# Patient Record
Sex: Female | Born: 1935 | Race: White | Hispanic: No | Smoking: Never smoker
Health system: Southern US, Community
[De-identification: ages and names within clinical notes are randomized; demographics above are authoritative.]

## PROBLEM LIST (undated history)

## (undated) DIAGNOSIS — E785 Hyperlipidemia, unspecified: Secondary | ICD-10-CM

## (undated) DIAGNOSIS — G459 Transient cerebral ischemic attack, unspecified: Secondary | ICD-10-CM

## (undated) DIAGNOSIS — Z4889 Encounter for other specified surgical aftercare: Secondary | ICD-10-CM

## (undated) DIAGNOSIS — I447 Left bundle-branch block, unspecified: Secondary | ICD-10-CM

## (undated) DIAGNOSIS — D72829 Elevated white blood cell count, unspecified: Secondary | ICD-10-CM

## (undated) DIAGNOSIS — K859 Acute pancreatitis without necrosis or infection, unspecified: Secondary | ICD-10-CM

## (undated) DIAGNOSIS — I998 Other disorder of circulatory system: Secondary | ICD-10-CM

## (undated) DIAGNOSIS — E039 Hypothyroidism, unspecified: Secondary | ICD-10-CM

## (undated) DIAGNOSIS — N390 Urinary tract infection, site not specified: Secondary | ICD-10-CM

## (undated) DIAGNOSIS — N393 Stress incontinence (female) (male): Secondary | ICD-10-CM

## (undated) DIAGNOSIS — I2510 Atherosclerotic heart disease of native coronary artery without angina pectoris: Secondary | ICD-10-CM

## (undated) DIAGNOSIS — G4730 Sleep apnea, unspecified: Secondary | ICD-10-CM

## (undated) DIAGNOSIS — R1084 Generalized abdominal pain: Secondary | ICD-10-CM

## (undated) DIAGNOSIS — M5136 Other intervertebral disc degeneration, lumbar region: Secondary | ICD-10-CM

## (undated) DIAGNOSIS — L84 Corns and callosities: Secondary | ICD-10-CM

## (undated) DIAGNOSIS — K1321 Leukoplakia of oral mucosa, including tongue: Secondary | ICD-10-CM

## (undated) DIAGNOSIS — I4891 Unspecified atrial fibrillation: Secondary | ICD-10-CM

## (undated) DIAGNOSIS — E782 Mixed hyperlipidemia: Secondary | ICD-10-CM

## (undated) DIAGNOSIS — L988 Other specified disorders of the skin and subcutaneous tissue: Secondary | ICD-10-CM

## (undated) DIAGNOSIS — K589 Irritable bowel syndrome without diarrhea: Secondary | ICD-10-CM

## (undated) DIAGNOSIS — R55 Syncope and collapse: Secondary | ICD-10-CM

## (undated) DIAGNOSIS — Z8679 Personal history of other diseases of the circulatory system: Secondary | ICD-10-CM

## (undated) DIAGNOSIS — E079 Disorder of thyroid, unspecified: Secondary | ICD-10-CM

## (undated) DIAGNOSIS — R269 Unspecified abnormalities of gait and mobility: Secondary | ICD-10-CM

## (undated) DIAGNOSIS — C499 Malignant neoplasm of connective and soft tissue, unspecified: Secondary | ICD-10-CM

## (undated) DIAGNOSIS — Z79899 Other long term (current) drug therapy: Secondary | ICD-10-CM

## (undated) DIAGNOSIS — K219 Gastro-esophageal reflux disease without esophagitis: Secondary | ICD-10-CM

## (undated) DIAGNOSIS — K635 Polyp of colon: Secondary | ICD-10-CM

## (undated) DIAGNOSIS — H539 Unspecified visual disturbance: Secondary | ICD-10-CM

## (undated) DIAGNOSIS — I509 Heart failure, unspecified: Secondary | ICD-10-CM

## (undated) DIAGNOSIS — E538 Deficiency of other specified B group vitamins: Secondary | ICD-10-CM

## (undated) DIAGNOSIS — B351 Tinea unguium: Secondary | ICD-10-CM

## (undated) DIAGNOSIS — E7800 Pure hypercholesterolemia, unspecified: Secondary | ICD-10-CM

## (undated) DIAGNOSIS — R42 Dizziness and giddiness: Secondary | ICD-10-CM

## (undated) DIAGNOSIS — R609 Edema, unspecified: Secondary | ICD-10-CM

## (undated) DIAGNOSIS — J449 Chronic obstructive pulmonary disease, unspecified: Secondary | ICD-10-CM

## (undated) DIAGNOSIS — I10 Essential (primary) hypertension: Secondary | ICD-10-CM

## (undated) DIAGNOSIS — G603 Idiopathic progressive neuropathy: Secondary | ICD-10-CM

## (undated) DIAGNOSIS — M2011 Hallux valgus (acquired), right foot: Secondary | ICD-10-CM

## (undated) DIAGNOSIS — M2012 Hallux valgus (acquired), left foot: Secondary | ICD-10-CM

## (undated) DIAGNOSIS — E119 Type 2 diabetes mellitus without complications: Secondary | ICD-10-CM

## (undated) DIAGNOSIS — I6782 Cerebral ischemia: Secondary | ICD-10-CM

## (undated) DIAGNOSIS — D649 Anemia, unspecified: Secondary | ICD-10-CM

## (undated) DIAGNOSIS — F419 Anxiety disorder, unspecified: Secondary | ICD-10-CM

## (undated) DIAGNOSIS — G4733 Obstructive sleep apnea (adult) (pediatric): Secondary | ICD-10-CM

## (undated) DIAGNOSIS — M150 Primary generalized (osteo)arthritis: Secondary | ICD-10-CM

## (undated) HISTORY — DX: Anxiety disorder, unspecified: F41.9

## (undated) HISTORY — DX: Atherosclerotic heart disease of native coronary artery without angina pectoris: I25.10

## (undated) HISTORY — DX: Pure hypercholesterolemia, unspecified: E78.00

## (undated) HISTORY — DX: Deficiency of other specified B group vitamins: E53.8

## (undated) HISTORY — DX: Unspecified atrial fibrillation: I48.91

## (undated) HISTORY — DX: Stress incontinence (female) (male): N39.3

## (undated) HISTORY — DX: Elevated white blood cell count, unspecified: D72.829

## (undated) HISTORY — DX: Transient cerebral ischemic attack, unspecified: G45.9

## (undated) HISTORY — PX: ABDOMINAL HYSTERECTOMY: SHX81

## (undated) HISTORY — DX: Hypothyroidism, unspecified: E03.9

## (undated) HISTORY — DX: Other specified disorders of the skin and subcutaneous tissue: L98.8

## (undated) HISTORY — PX: OTHER SURGICAL HISTORY: SHX169

## (undated) HISTORY — DX: Other intervertebral disc degeneration, lumbar region: M51.36

## (undated) HISTORY — DX: Urinary tract infection, site not specified: N39.0

## (undated) HISTORY — DX: Other disorder of circulatory system: I99.8

## (undated) HISTORY — PX: PANCREAS SURGERY: SHX731

## (undated) HISTORY — DX: Disorder of thyroid, unspecified: E07.9

## (undated) HISTORY — DX: Dizziness and giddiness: R42

## (undated) HISTORY — DX: Idiopathic progressive neuropathy: G60.3

## (undated) HISTORY — DX: Generalized abdominal pain: R10.84

## (undated) HISTORY — DX: Polyp of colon: K63.5

## (undated) HISTORY — DX: Personal history of other diseases of the circulatory system: Z86.79

## (undated) HISTORY — DX: Sleep apnea, unspecified: G47.30

## (undated) HISTORY — DX: Gastro-esophageal reflux disease without esophagitis: K21.9

## (undated) HISTORY — DX: Type 2 diabetes mellitus without complications: E11.9

## (undated) HISTORY — PX: CHOLECYSTECTOMY: SHX55

## (undated) HISTORY — DX: Hyperlipidemia, unspecified: E78.5

## (undated) HISTORY — DX: Leukoplakia of oral mucosa, including tongue: K13.21

## (undated) HISTORY — DX: Encounter for other specified surgical aftercare: Z48.89

## (undated) HISTORY — DX: Syncope and collapse: R55

## (undated) HISTORY — DX: Cerebral ischemia: I67.82

## (undated) HISTORY — DX: Tinea unguium: B35.1

## (undated) HISTORY — DX: Acute pancreatitis without necrosis or infection, unspecified: K85.90

## (undated) HISTORY — DX: Heart failure, unspecified: I50.9

## (undated) HISTORY — DX: Unspecified visual disturbance: H53.9

## (undated) HISTORY — DX: Unspecified abnormalities of gait and mobility: R26.9

## (undated) HISTORY — DX: Corns and callosities: L84

## (undated) HISTORY — DX: Obstructive sleep apnea (adult) (pediatric): G47.33

## (undated) HISTORY — DX: Essential (primary) hypertension: I10

## (undated) HISTORY — DX: Malignant neoplasm of connective and soft tissue, unspecified: C49.9

## (undated) HISTORY — DX: Irritable bowel syndrome without diarrhea: K58.9

## (undated) HISTORY — DX: Primary generalized (osteo)arthritis: M15.0

## (undated) HISTORY — DX: Chronic obstructive pulmonary disease, unspecified: J44.9

## (undated) HISTORY — PX: EYE SURGERY: SHX253

## (undated) HISTORY — DX: Left bundle-branch block, unspecified: I44.7

## (undated) HISTORY — DX: Edema, unspecified: R60.9

## (undated) HISTORY — DX: Mixed hyperlipidemia: E78.2

## (undated) HISTORY — DX: Other long term (current) drug therapy: Z79.899

## (undated) HISTORY — DX: Hallux valgus (acquired), right foot: M20.11

## (undated) HISTORY — DX: Anemia, unspecified: D64.9

## (undated) HISTORY — DX: Hallux valgus (acquired), left foot: M20.12

---

## 2009-06-07 DIAGNOSIS — R269 Unspecified abnormalities of gait and mobility: Secondary | ICD-10-CM

## 2009-06-07 DIAGNOSIS — G603 Idiopathic progressive neuropathy: Secondary | ICD-10-CM | POA: Insufficient documentation

## 2009-06-07 HISTORY — DX: Idiopathic progressive neuropathy: G60.3

## 2009-06-07 HISTORY — DX: Unspecified abnormalities of gait and mobility: R26.9

## 2012-03-09 DIAGNOSIS — Z859 Personal history of malignant neoplasm, unspecified: Secondary | ICD-10-CM

## 2012-03-09 DIAGNOSIS — E119 Type 2 diabetes mellitus without complications: Secondary | ICD-10-CM

## 2012-03-09 DIAGNOSIS — E039 Hypothyroidism, unspecified: Secondary | ICD-10-CM

## 2012-03-09 DIAGNOSIS — I110 Hypertensive heart disease with heart failure: Secondary | ICD-10-CM

## 2012-03-09 DIAGNOSIS — C499 Malignant neoplasm of connective and soft tissue, unspecified: Secondary | ICD-10-CM

## 2012-03-09 DIAGNOSIS — J449 Chronic obstructive pulmonary disease, unspecified: Secondary | ICD-10-CM

## 2012-03-09 DIAGNOSIS — I10 Essential (primary) hypertension: Secondary | ICD-10-CM

## 2012-03-09 HISTORY — DX: Malignant neoplasm of connective and soft tissue, unspecified: C49.9

## 2012-03-09 HISTORY — DX: Hypothyroidism, unspecified: E03.9

## 2012-03-09 HISTORY — DX: Personal history of malignant neoplasm, unspecified: Z85.9

## 2012-03-09 HISTORY — DX: Type 2 diabetes mellitus without complications: E11.9

## 2012-03-09 HISTORY — DX: Hypertensive heart disease with heart failure: I11.0

## 2012-03-09 HISTORY — DX: Essential (primary) hypertension: I10

## 2012-03-09 HISTORY — DX: Chronic obstructive pulmonary disease, unspecified: J44.9

## 2014-02-20 DIAGNOSIS — D509 Iron deficiency anemia, unspecified: Secondary | ICD-10-CM

## 2014-02-20 DIAGNOSIS — K219 Gastro-esophageal reflux disease without esophagitis: Secondary | ICD-10-CM | POA: Insufficient documentation

## 2014-02-20 DIAGNOSIS — F419 Anxiety disorder, unspecified: Secondary | ICD-10-CM

## 2014-02-20 DIAGNOSIS — L988 Other specified disorders of the skin and subcutaneous tissue: Secondary | ICD-10-CM

## 2014-02-20 DIAGNOSIS — E119 Type 2 diabetes mellitus without complications: Secondary | ICD-10-CM

## 2014-02-20 DIAGNOSIS — I10 Essential (primary) hypertension: Secondary | ICD-10-CM

## 2014-02-20 HISTORY — DX: Gastro-esophageal reflux disease without esophagitis: K21.9

## 2014-02-20 HISTORY — DX: Essential (primary) hypertension: I10

## 2014-02-20 HISTORY — DX: Type 2 diabetes mellitus without complications: E11.9

## 2014-02-20 HISTORY — DX: Other specified disorders of the skin and subcutaneous tissue: L98.8

## 2014-02-20 HISTORY — DX: Anxiety disorder, unspecified: F41.9

## 2014-02-20 HISTORY — DX: Iron deficiency anemia, unspecified: D50.9

## 2014-08-03 ENCOUNTER — Encounter: Payer: Self-pay | Admitting: Gastroenterology

## 2015-06-24 DIAGNOSIS — I5032 Chronic diastolic (congestive) heart failure: Secondary | ICD-10-CM

## 2015-06-24 DIAGNOSIS — G4733 Obstructive sleep apnea (adult) (pediatric): Secondary | ICD-10-CM

## 2015-06-24 DIAGNOSIS — I447 Left bundle-branch block, unspecified: Secondary | ICD-10-CM

## 2015-06-24 DIAGNOSIS — E782 Mixed hyperlipidemia: Secondary | ICD-10-CM

## 2015-06-24 DIAGNOSIS — I509 Heart failure, unspecified: Secondary | ICD-10-CM

## 2015-06-24 DIAGNOSIS — D72829 Elevated white blood cell count, unspecified: Secondary | ICD-10-CM | POA: Insufficient documentation

## 2015-06-24 DIAGNOSIS — K1321 Leukoplakia of oral mucosa, including tongue: Secondary | ICD-10-CM

## 2015-06-24 DIAGNOSIS — Z79899 Other long term (current) drug therapy: Secondary | ICD-10-CM | POA: Insufficient documentation

## 2015-06-24 DIAGNOSIS — K589 Irritable bowel syndrome without diarrhea: Secondary | ICD-10-CM | POA: Insufficient documentation

## 2015-06-24 DIAGNOSIS — R609 Edema, unspecified: Secondary | ICD-10-CM

## 2015-06-24 HISTORY — DX: Other long term (current) drug therapy: Z79.899

## 2015-06-24 HISTORY — DX: Left bundle-branch block, unspecified: I44.7

## 2015-06-24 HISTORY — DX: Irritable bowel syndrome, unspecified: K58.9

## 2015-06-24 HISTORY — DX: Heart failure, unspecified: I50.9

## 2015-06-24 HISTORY — DX: Edema, unspecified: R60.9

## 2015-06-24 HISTORY — DX: Obstructive sleep apnea (adult) (pediatric): G47.33

## 2015-06-24 HISTORY — DX: Elevated white blood cell count, unspecified: D72.829

## 2015-06-24 HISTORY — DX: Chronic diastolic (congestive) heart failure: I50.32

## 2015-06-24 HISTORY — DX: Leukoplakia of oral mucosa, including tongue: K13.21

## 2015-06-24 HISTORY — DX: Mixed hyperlipidemia: E78.2

## 2015-06-27 DIAGNOSIS — N390 Urinary tract infection, site not specified: Secondary | ICD-10-CM

## 2015-06-27 HISTORY — DX: Urinary tract infection, site not specified: N39.0

## 2015-07-06 DIAGNOSIS — M159 Polyosteoarthritis, unspecified: Secondary | ICD-10-CM

## 2015-07-06 DIAGNOSIS — M8949 Other hypertrophic osteoarthropathy, multiple sites: Secondary | ICD-10-CM

## 2015-07-06 DIAGNOSIS — M150 Primary generalized (osteo)arthritis: Secondary | ICD-10-CM

## 2015-07-06 HISTORY — DX: Other hypertrophic osteoarthropathy, multiple sites: M89.49

## 2015-07-06 HISTORY — DX: Primary generalized (osteo)arthritis: M15.0

## 2015-07-06 HISTORY — DX: Polyosteoarthritis, unspecified: M15.9

## 2015-07-29 DIAGNOSIS — I2510 Atherosclerotic heart disease of native coronary artery without angina pectoris: Secondary | ICD-10-CM

## 2015-07-29 DIAGNOSIS — E785 Hyperlipidemia, unspecified: Secondary | ICD-10-CM | POA: Insufficient documentation

## 2015-07-29 HISTORY — DX: Atherosclerotic heart disease of native coronary artery without angina pectoris: I25.10

## 2015-07-29 HISTORY — DX: Hyperlipidemia, unspecified: E78.5

## 2015-11-06 DIAGNOSIS — M5136 Other intervertebral disc degeneration, lumbar region: Secondary | ICD-10-CM

## 2015-11-06 DIAGNOSIS — M51369 Other intervertebral disc degeneration, lumbar region without mention of lumbar back pain or lower extremity pain: Secondary | ICD-10-CM

## 2015-11-06 HISTORY — DX: Other intervertebral disc degeneration, lumbar region without mention of lumbar back pain or lower extremity pain: M51.369

## 2015-11-06 HISTORY — DX: Other intervertebral disc degeneration, lumbar region: M51.36

## 2015-11-27 DIAGNOSIS — L84 Corns and callosities: Secondary | ICD-10-CM

## 2015-11-27 DIAGNOSIS — B351 Tinea unguium: Secondary | ICD-10-CM | POA: Insufficient documentation

## 2015-11-27 DIAGNOSIS — Z4889 Encounter for other specified surgical aftercare: Secondary | ICD-10-CM

## 2015-11-27 HISTORY — DX: Tinea unguium: B35.1

## 2015-11-27 HISTORY — DX: Corns and callosities: L84

## 2015-11-27 HISTORY — DX: Encounter for other specified surgical aftercare: Z48.89

## 2015-12-04 DIAGNOSIS — M2011 Hallux valgus (acquired), right foot: Secondary | ICD-10-CM

## 2015-12-04 DIAGNOSIS — L84 Corns and callosities: Secondary | ICD-10-CM

## 2015-12-04 DIAGNOSIS — M2012 Hallux valgus (acquired), left foot: Secondary | ICD-10-CM

## 2015-12-04 DIAGNOSIS — E538 Deficiency of other specified B group vitamins: Secondary | ICD-10-CM

## 2015-12-04 HISTORY — DX: Corns and callosities: L84

## 2015-12-04 HISTORY — DX: Hallux valgus (acquired), left foot: M20.12

## 2015-12-04 HISTORY — DX: Hallux valgus (acquired), right foot: M20.11

## 2015-12-04 HISTORY — DX: Deficiency of other specified B group vitamins: E53.8

## 2016-01-29 DIAGNOSIS — I998 Other disorder of circulatory system: Secondary | ICD-10-CM | POA: Insufficient documentation

## 2016-01-29 DIAGNOSIS — H539 Unspecified visual disturbance: Secondary | ICD-10-CM

## 2016-01-29 DIAGNOSIS — Z8679 Personal history of other diseases of the circulatory system: Secondary | ICD-10-CM

## 2016-01-29 HISTORY — DX: Other disorder of circulatory system: I99.8

## 2016-01-29 HISTORY — DX: Unspecified visual disturbance: H53.9

## 2016-01-29 HISTORY — DX: Personal history of other diseases of the circulatory system: Z86.79

## 2016-02-07 DIAGNOSIS — R42 Dizziness and giddiness: Secondary | ICD-10-CM

## 2016-02-07 HISTORY — DX: Dizziness and giddiness: R42

## 2016-03-11 DIAGNOSIS — I6782 Cerebral ischemia: Secondary | ICD-10-CM

## 2016-03-11 HISTORY — DX: Cerebral ischemia: I67.82

## 2016-03-18 ENCOUNTER — Ambulatory Visit: Payer: Self-pay | Admitting: Allergy

## 2016-04-17 DIAGNOSIS — N393 Stress incontinence (female) (male): Secondary | ICD-10-CM

## 2016-04-17 HISTORY — DX: Stress incontinence (female) (male): N39.3

## 2016-10-13 DIAGNOSIS — G459 Transient cerebral ischemic attack, unspecified: Secondary | ICD-10-CM | POA: Insufficient documentation

## 2016-10-13 DIAGNOSIS — R55 Syncope and collapse: Secondary | ICD-10-CM | POA: Insufficient documentation

## 2016-10-13 HISTORY — DX: Transient cerebral ischemic attack, unspecified: G45.9

## 2016-10-13 HISTORY — DX: Syncope and collapse: R55

## 2016-10-18 DIAGNOSIS — D649 Anemia, unspecified: Secondary | ICD-10-CM

## 2016-10-18 HISTORY — DX: Anemia, unspecified: D64.9

## 2016-10-24 DIAGNOSIS — R1084 Generalized abdominal pain: Secondary | ICD-10-CM

## 2016-10-24 HISTORY — DX: Generalized abdominal pain: R10.84

## 2016-11-28 ENCOUNTER — Ambulatory Visit: Payer: Self-pay | Admitting: Cardiology

## 2016-12-09 NOTE — Progress Notes (Signed)
Cardiology Office Note:    Date:  12/10/2016   ID:  Wendy Leon, DOB 31-Jul-1932, MRN [id number]  PCP:  Raina Mina., MD  Cardiologist:  Shirlee More, MD    Referring MD: No ref. provider found    ASSESSMENT:    1. Chronic diastolic heart failure ([place])   2. Essential hypertension   3. Left bundle branch block   4. Mild CAD    PLAN:    In order of problems listed above:  1. Stable compensated continue current diuretics sodium restriction 2. Stable blood pressure target today she has no significant orthostatic drop repeat blood pressure by me 142/70 standing 130/60 and I would continue her current treatment including calcium channel blocker and diuretic and beta blocker 3. Stable pattern reducing the EKG 4. Stable continue medical treatment including antibody treatment for hyperlipidemia being intolerant of oral lipid-lowering medications. We will check a lipid profile today   Next appointment: Stable   Medication Adjustments/Labs and Tests Ordered: Current medicines are reviewed at length with the patient today.  Concerns regarding medicines are outlined above.  No orders of the defined types were placed in this encounter.  No orders of the defined types were placed in this encounter.   Chief Complaint  Patient presents with  . Follow-up    Routine flup appt, needs clearance to have dental procedure  . Irregular Heart Beat    History of Present Illness:    Wendy Leon is a 83 y.o. female with a hx of pancreatic cancer,mild CAD 2016 with 60% LAD with normal FFR , Dyslipidemia with statin intolerance, HTN and costochondral chest pain last seen in April 2018. She had a recent [facility] admission June when she had a syncopal episode and has had previous severe symptomatic orthostatic hypotension. Since then she is done well no dizziness syncope chest pain palpitation. She has dental abscess and is awaiting tooth extraction and was requested to discuss with  me Compliance with diet, lifestyle and medications: Yes Past Medical History:  Diagnosis Date  . Abnormal gait 06/07/2009   Last Assessment & Plan:  She saw specialist who has given her orders for PT and she is wanting to have it setup for her at [facility] where she already is receiving care from at home  . Acid reflux 02/20/2014  . Aftercare following surgery 11/27/2015  . Anemia of unknown etiology 10/18/2016  . Anxiety 02/20/2014  . Cerebral ischemia 03/11/2016   Overview:  Microvascular ischemia seen on MRI 01/2015 with partial empty sella turcica neurology appt 03/12/2016 pending with [facility], also [facility] with PT coming to the house for her gait abnormality and lumbar DDD, as well as local ortho (d[facility])   . CHF (congestive heart failure) ([place]) 06/24/2015   Last Assessment & Plan:  Relevant Hx: Course: Daily Update: Today's Plan:she has been doing well overall from her heart and no SOB  Electronically signed by: Mayer Camel, NP 06/26/15 1522  . Chronic UTI 06/27/2015   Last Assessment & Plan:  She requested a UA be checked for her as she was having more frequency than before and it has been awhile since she was on oral abx for this, she is on low dose maintenance med to prevent and has done well with that.  Marland Kitchen COPD (chronic obstructive pulmonary disease) ([place]) 03/09/2012   Last Assessment & Plan:  She feels she is stable from this discussed with her the allergies she has and she is  going to discuss with Dr. Alcide Clever having allergy injections as well for this   . Diabetes ([place]) 02/20/2014  . Dizziness 02/07/2016   Overview:  Chronic and has been having falls, MRI showing chronic microvascular ischemia, as well as partial sella turcica pending neuro appt for her with [facility]  . Dyslipidemia 07/29/2015  . Edema 06/24/2015   Last Assessment & Plan:  Relevant Hx: Course: Daily Update: Today's Plan:this is stable for her overall and will follow for her  Electronically signed by:  Mayer Camel, NP 06/27/15 0813  . Essential hypertension 03/09/2012   Last Assessment & Plan:  On repeat this was stable for her and she does not check it at home much but when has been checked has been fine for her  . Fistula 02/20/2014  . Gastroesophageal reflux disease without esophagitis 02/20/2014   Last Assessment & Plan:  Relevant Hx: Course: Daily Update: Today's Plan:not on meds for this as she has had issues with her arms cramping and she is going to just drink buttermilk  Electronically signed by: Mayer Camel, NP 06/26/15 1530  . Generalized abdominal pain 10/24/2016  . Hallux valgus of left foot 12/04/2015  . Hallux valgus of right foot 12/04/2015  . High risk medication use 06/24/2015  . Hx of valvular heart disease 01/29/2016  . Hypertension 02/20/2014  . Hypothyroidism (acquired) 03/09/2012   Last Assessment & Plan:  She was due for this to be updated and have advised her to get this drawn while here today  . IBS (irritable bowel syndrome) 06/24/2015   Last Assessment & Plan:  Relevant Hx: Course: Daily Update: Today's Plan:this is stable for her  Electronically signed by: Mayer Camel, NP 06/26/15 1531  . Idiopathic progressive polyneuropathy 06/07/2009  . Left bundle branch block 06/24/2015  . Leukocytosis 06/24/2015   Last Assessment & Plan:  Relevant Hx: Course: Daily Update: Today's Plan:this has been stable for her with FU through hematology who felt she was having benign swings in this and she chose to not have bone marrow biopsy done   Electronically signed by: Mayer Camel, NP 06/27/15 [phone]  . Liposarcoma, well differentiated type ([place]) 03/09/2012   Overview:  Initial excision 03/18/2000 (Dr. Ralene Cork [facility]), 8 x 6 x 3 cm. Local recurrence, re-excision 03/23/2007 Candiss Norse), 5 x 3 x 2 cm. Surveillance plan: baseline MRI April 2014  Last Assessment & Plan:  Relevant Hx: Course: Daily Update: Today's Plan:she is  stable from this  Electronically signed by: Mayer Camel, NP 06/26/15 1533  . Lumbar degenerative disc disease 11/06/2015   Last Assessment & Plan:  At this point she needs updated MRI of her lumbar spine and we discussed with her gait abnormality she is likely going to have to see specialist at [facility] for this, she has seen local neuro for this several years ago in terms of her neuropathy and her falls and he really did not address it . She was advised several years ago to have back surgery locally which we did not feel was ideal with her sugars at the time being so out of control and now she is really more in control than then and it may be doable for her if felt  Needed though she would have to have cardiology clear her as well.  . Mild CAD 07/29/2015   Overview:  Overview:  Cath march 2016 with 60% LAD with normal FFR   Overview:  Cath march 2016 with 60% LAD  with normal FFR   . Mixed hyperlipidemia 06/24/2015   Last Assessment & Plan:  Relevant Hx: Course: Daily Update: Today's Plan:update her lipids for her today and she is trying to watch her diet   Electronically signed by: Mayer Camel, NP 06/27/15 0813  . Obstructive sleep apnea syndrome 06/24/2015  . Onychomycosis due to dermatophyte 11/27/2015  . 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   Last Assessment & Plan:  Relevant Hx: Course: Daily Update: Today's Plan:she is doing well overall  Electronically signed by: Mayer Camel, NP 06/26/15 1531  . Pre-ulcerative calluses 12/04/2015  . Pre-ulcerative corn or callous 11/27/2015  . Primary osteoarthritis involving multiple joints 07/06/2015   Last Assessment & Plan:  Relevant Hx: Course: Daily Update: Today's Plan:she has DDD of her back with some spinal  Stenosis that she has seen ortho locally for and he wanted to do surgery which she has avoided and I agree with, she is going to use the flexeril for this right now as she is having more muscle spasm than what she has had in  the past from her attempt to walk down some very steep steps that she was not used to . She should use her walker for support through the weekend and avoid overhead strain and lifting and stay in touch if not better.  Electronically signed by: Mayer Camel, NP 07/06/15 [phone]  . Stress incontinence of urine 04/17/2016  . Syncope and collapse 10/13/2016  . TIA (transient ischemic attack) 10/13/2016  . Type 2 diabetes mellitus without complication, without long-term current use of insulin ([place]) 03/09/2012   Last Assessment & Plan:  She brings in readings of her sugars which show she has been doing much better overall with this at home and quite pleased with her  . Vascular calcification 01/29/2016  . Visual changes 01/29/2016   Overview:  Following with Dr. Julio Alm as well as had recent carotid doppler showing minimal plaque present and vertebral arteries normal , nothing signficant  . Vitamin B12 deficiency 12/04/2015    Past Surgical History:  Procedure Laterality Date  . ABDOMINAL HYSTERECTOMY    . CHOLECYSTECTOMY    . EYE SURGERY    . PANCREAS SURGERY    . skin cancer lesion      Current Medications: Current Meds  Medication Sig  . Alirocumab 150 MG/ML SOPN Inject 150 mg into the skin every 14 (fourteen) days.  . ALPRAZolam (XANAX) 0.25 MG tablet Take 0.25 mg by mouth 3 (three) times daily as needed for anxiety.  Marland Kitchen amLODipine (NORVASC) 2.5 MG tablet Take 2.5 mg by mouth daily.  Marland Kitchen aspirin EC 81 MG tablet Take 81 mg by mouth daily.  . benazepril (LOTENSIN) 10 MG tablet Take 10 mg by mouth daily.  . betamethasone dipropionate (DIPROLENE) 0.05 % cream APPLY TO AFFECTED AREAS TOPICALLY TWICE DAILY AS NEEDED  . bethanechol (URECHOLINE) 10 MG tablet Take 10 mg by mouth 2 (two) times daily.  . Blood Glucose Monitoring Suppl (ACCU-CHEK AVIVA PLUS) w/Device KIT See admin instructions.  . donepezil (ARICEPT) 5 MG tablet Take 5 mg by mouth at bedtime.  Marland Kitchen doxazosin (CARDURA) 1 MG tablet  Take 1 mg by mouth daily.  . fluticasone (FLONASE) 50 MCG/ACT nasal spray Place 1 spray into the nose 2 (two) times daily.  . furosemide (LASIX) 40 MG tablet TAKE 1 TABLET IN THE MORNING  108Q 5 TAKE 1 2 TABLET AT BEDTIME  . gabapentin (NEURONTIN) 300 MG capsule Take 300 mg by mouth daily.  Marland Kitchen  glimepiride (AMARYL) 1 MG tablet Take 0.5 tablets by mouth daily.  Marland Kitchen levothyroxine (SYNTHROID, LEVOTHROID) 75 MCG tablet Take 75 mcg by mouth daily.  Marland Kitchen loratadine (CLARITIN) 10 MG tablet Take 10 mg by mouth at bedtime.  . metFORMIN (GLUCOPHAGE-XR) 500 MG 24 hr tablet Take 500 mg by mouth 2 (two) times daily.  . metoprolol tartrate (LOPRESSOR) 50 MG tablet Take 50 mg by mouth 2 (two) times daily.  . montelukast (SINGULAIR) 10 MG tablet Take 10 mg by mouth at bedtime.  . nitroGLYCERIN (NITROSTAT) 0.4 MG SL tablet Place 0.4 mg under the tongue every 5 (five) minutes x 3 doses as needed for chest pain.  . polyethylene glycol powder (GLYCOLAX/MIRALAX) powder TAKE 17 GRAMS BY MOUTH TWICE A DAY  . ranitidine (ZANTAC) 150 MG tablet Take 150 mg by mouth daily.  . silver sulfADIAZINE (SILVADENE) 1 % cream APPLY TO AFFECTED AREAS TOPICALLY TWICE DAILY AS NEEDED  . SYMBICORT 160-4.5 MCG/ACT inhaler Inhale 2 puffs into the lungs 2 (two) times daily.     Allergies:   Nitrofurantoin and Statins   Social History   Social History  . Marital status: Married    Spouse name: N/A  . Number of children: N/A  . Years of education: N/A   Social History Main Topics  . Smoking status: Never Smoker  . Smokeless tobacco: Never Used  . Alcohol use No  . Drug use: No  . Sexual activity: Not Asked   Other Topics Concern  . None   Social History Narrative  . None     Family History: The patient's family history includes Cancer in her sister; Diabetes in her mother. ROS:   Please see the history of present illness.    All other systems reviewed and are negative.  EKGs/Labs/Other Studies Reviewed:    EKg 10/13/16:   Result Narrative  Normal sinus rhythm Left ventricular hypertrophy with QRS widening and repolarization abnormality Cannot rule out Septal infarct , age undetermined Abnormal ECG When compared with ECG of 08-Dec-2003 15:48, Significant changes have occurred Confirmed by Marta Antu [phone]) on 10/12/2016 11:32:48 PM   10/13/16 troponin undetectable, CBC and CMP normal The following studies were reviewed today: Echo in June 2018, Conclusions Summary Normal left ventricular systolic function There is mild aortic sclerosis noted, with no evidence of stenosis. Normal right ventricular size and function.  Recent Labs: 10/17/16 A1c 7.5% CMP normal No results found for requested labs within last 8760 hours.  Recent Lipid Panel 04/17/16: Chol230, HDL 70 LDL 140 No results found for: CHOL, TRIG, HDL, CHOLHDL, VLDL, LDLCALC, LDLDIRECT  Physical Exam:    VS:  BP (!) 162/76 (BP Location: Right Arm, Patient Position: Sitting)   Pulse 64   Ht '5\' 4"'$  (1.626 m)   Wt 132 lb (59.9 kg)   SpO2 97%   BMI 22.66 kg/m     Wt Readings from Last 3 Encounters:  12/10/16 132 lb (59.9 kg)     GEN:  Well nourished, well developed in no acute distress HEENT: Normal NECK: No JVD; No carotid bruits LYMPHATICS: No lymphadenopathy CARDIAC: RRR, no murmurs, rubs, gallops RESPIRATORY:  Clear to auscultation without rales, wheezing or rhonchi  ABDOMEN: Soft, non-tender, non-distended MUSCULOSKELETAL:  No edema; No deformity  SKIN: Warm and dry NEUROLOGIC:  Alert and oriented x 3 PSYCHIATRIC:  Normal affect    Signed, Shirlee More, MD  12/10/2016 11:34 AM    [place]

## 2016-12-10 ENCOUNTER — Ambulatory Visit (INDEPENDENT_AMBULATORY_CARE_PROVIDER_SITE_OTHER): Payer: Medicare Other | Admitting: Cardiology

## 2016-12-10 ENCOUNTER — Other Ambulatory Visit: Payer: Self-pay | Admitting: Cardiology

## 2016-12-10 ENCOUNTER — Encounter: Payer: Self-pay | Admitting: Cardiology

## 2016-12-10 VITALS — BP 146/82 | HR 64 | Ht 64.0 in | Wt 132.0 lb

## 2016-12-10 DIAGNOSIS — I447 Left bundle-branch block, unspecified: Secondary | ICD-10-CM | POA: Diagnosis not present

## 2016-12-10 DIAGNOSIS — I2510 Atherosclerotic heart disease of native coronary artery without angina pectoris: Secondary | ICD-10-CM

## 2016-12-10 DIAGNOSIS — E785 Hyperlipidemia, unspecified: Secondary | ICD-10-CM

## 2016-12-10 DIAGNOSIS — I5032 Chronic diastolic (congestive) heart failure: Secondary | ICD-10-CM | POA: Diagnosis not present

## 2016-12-10 DIAGNOSIS — I10 Essential (primary) hypertension: Secondary | ICD-10-CM | POA: Diagnosis not present

## 2016-12-10 NOTE — Patient Instructions (Signed)
Medication Instructions:  Your physician recommends that you continue on your current medications as directed. Please refer to the Current Medication list given to you today.   Labwork: Your physician recommends that you return for lab work in: today. Lipid   Testing/Procedures: None  Follow-Up: Your physician wants you to follow-up in: 6 months. You will receive a reminder letter in the mail two months in advance. If you don't receive a letter, please call our office to schedule the follow-up appointment.   Any Other Special Instructions Will Be Listed Below (If Applicable).     If you need a refill on your cardiac medications before your next appointment, please call your pharmacy.

## 2016-12-11 LAB — LIPID PANEL
Chol/HDL Ratio: 2.2 ratio (ref 0.0–4.4)
Cholesterol, Total: 150 mg/dL (ref 100–199)
HDL: 69 mg/dL (ref >39)
LDL Calculated: 63 mg/dL (ref 0–99)
Triglycerides: 90 mg/dL (ref 0–149)
VLDL Cholesterol Cal: 18 mg/dL (ref 5–40)

## 2017-01-27 ENCOUNTER — Other Ambulatory Visit: Payer: Self-pay

## 2017-01-27 MED ORDER — BENAZEPRIL HCL 10 MG PO TABS: 10.0000 mg | ORAL_TABLET | Freq: Every day | ORAL | 3 refills | Status: DC

## 2017-01-30 ENCOUNTER — Other Ambulatory Visit: Payer: Self-pay

## 2017-02-03 ENCOUNTER — Other Ambulatory Visit: Payer: Self-pay

## 2017-02-03 MED ORDER — FUROSEMIDE 40 MG PO TABS: 40.0000 mg | ORAL_TABLET | Freq: Two times a day (BID) | ORAL | 3 refills | Status: DC

## 2017-02-03 MED ORDER — NITROGLYCERIN 0.4 MG SL SUBL: 0.4000 mg | SUBLINGUAL_TABLET | SUBLINGUAL | 11 refills | Status: DC | PRN

## 2017-05-18 DIAGNOSIS — I361 Nonrheumatic tricuspid (valve) insufficiency: Secondary | ICD-10-CM | POA: Diagnosis not present

## 2017-05-18 DIAGNOSIS — I10 Essential (primary) hypertension: Secondary | ICD-10-CM | POA: Diagnosis not present

## 2017-05-28 ENCOUNTER — Telehealth: Payer: Self-pay | Admitting: Pharmacist

## 2017-05-28 MED ORDER — ALIROCUMAB 150 MG/ML ~~LOC~~ SOPN: 150.0000 mg | PEN_INJECTOR | SUBCUTANEOUS | 11 refills | Status: DC

## 2017-05-28 NOTE — Telephone Encounter (Signed)
Praluent prior authorization has been approved through 04/27/18.

## 2017-07-06 NOTE — Progress Notes (Signed)
Cardiology Office Note:    Date:  07/07/2017   ID:  Wendy Leon, DOB 09/04/1932, MRN [id number]  PCP:  Raina Mina., MD  Cardiologist:  Shirlee More, MD    Referring MD: Raina Mina., MD    ASSESSMENT:    1. Chronic diastolic heart failure ([place])   2. Hypertensive heart disease with heart failure ([place])   3. Left bundle branch block   4. Mild CAD    PLAN:    In order of problems listed above:  1. Stable compensated continue current diuretic sodium restriction home self management. 2. Stable her standing blood pressure today is 138/60 she has had severe symptomatic orthostatic hypotension in the past due to neuropathy at this time I would not try to control her diabetes to a tighter target.  Continue current treatment including low-dose beta-blocker 3. Stable pattern on EKG performed in June 2018 reviewed independently through care everywhere from a previous practice 4. Stable continue current treatment including aspirin calcium channel blocker beta-blocker and lipid-lowering therapy with Repatha as she is absolutely statin intolerant.  Her lipids are ideal 5. Stable continue Repatha as she is statin intolerant and high risk with CAD.  She has no evidence of liver toxicity and her LDL is at target   Next appointment: 6 months   Medication Adjustments/Labs and Tests Ordered: Current medicines are reviewed at length with the patient today.  Concerns regarding medicines are outlined above.  No orders of the defined types were placed in this encounter.  No orders of the defined types were placed in this encounter.   Chief Complaint  Patient presents with  . Follow-up    6 month flup appt   . Congestive Heart Failure  . Hypertension  . Coronary Artery Disease    History of Present Illness:    Wendy Leon is a 84 y.o. female with a hx of pancreatic cancer,mild CAD 2016 with 60% LAD with normal FFR , Dyslipidemia with statin intolerance, HTN and costochondral chest pain   last seen 12/10/16.  ASSESSMENT:    12/10/16   1. Chronic diastolic heart failure ([place])   2. Essential hypertension   3. Left bundle branch block   4. Mild CAD    PLAN:    In order of problems listed above:  1. Stable compensated continue current diuretics sodium restriction 2. Stable blood pressure target today she has no significant orthostatic drop repeat blood pressure by me 142/70 standing 130/60 and I would continue her current treatment including calcium channel blocker and diuretic and beta blocker 3. Stable pattern reducing the EKG 4. Stable continue medical treatment including antibody treatment for hyperlipidemia being intolerant of oral lipid-lowering medications. We will check a lipid profile today I do not think she requires a repeat ischemia evaluation at this time  Compliance with diet, lifestyle and medications: Yes She is pleased with the quality of her life cares for herself supervised by her daughter she continues to have an unsteady gait and uses a walking stick but is not having falls and has had no emergency room visits chest pain shortness of breath palpitation or syncope.  Labs were drawn through her primary care physician I reviewed them through the epic system. Past Medical History:  Diagnosis Date  . Abnormal gait 06/07/2009   Last Assessment & Plan:  She saw specialist who has given her orders for PT and she is wanting to have it setup for her at [facility] where she already is receiving care  from at home  . Acid reflux 02/20/2014  . Aftercare following surgery 11/27/2015  . Anemia of unknown etiology 10/18/2016  . Anxiety 02/20/2014  . Cerebral ischemia 03/11/2016   Overview:  Microvascular ischemia seen on MRI 01/2015 with partial empty sella turcica neurology appt 03/12/2016 pending with [facility], also has [facility] with PT coming to the house for her gait abnormality and lumbar DDD, as well as local ortho ([facility])   . CHF (congestive heart failure)  ([place]) 06/24/2015   Last Assessment & Plan:  Relevant Hx: Course: Daily Update: Today's Plan:she has been doing well overall from her heart and no SOB  Electronically signed by: Mayer Camel, NP 06/26/15 1522  . Chronic UTI 06/27/2015   Last Assessment & Plan:  She requested a UA be checked for her as she was having more frequency than before and it has been awhile since she was on oral abx for this, she is on low dose maintenance med to prevent and has done well with that.  Marland Kitchen COPD (chronic obstructive pulmonary disease) ([place]) 03/09/2012   Last Assessment & Plan:  She feels she is stable from this discussed with her the allergies she has and she is going to discuss with Dr. Alcide Clever having allergy injections as well for this   . Diabetes ([place]) 02/20/2014  . Dizziness 02/07/2016   Overview:  Chronic and has been having falls, MRI showing chronic microvascular ischemia, as well as partial sella turcica pending neuro appt for her with [facility]  . Dyslipidemia 07/29/2015  . Edema 06/24/2015   Last Assessment & Plan:  Relevant Hx: Course: Daily Update: Today's Plan:this is stable for her overall and will follow for her  Electronically signed by: Mayer Camel, NP 06/27/15 0813  . Essential hypertension 03/09/2012   Last Assessment & Plan:  On repeat this was stable for her and she does not check it at home much but when has been checked has been fine for her  . Fistula 02/20/2014  . Gastroesophageal reflux disease without esophagitis 02/20/2014   Last Assessment & Plan:  Relevant Hx: Course: Daily Update: Today's Plan:not on meds for this as she has had issues with her arms cramping and she is going to just drink buttermilk  Electronically signed by: Mayer Camel, NP 06/26/15 1530  . Generalized abdominal pain 10/24/2016  . Hallux valgus of left foot 12/04/2015  . Hallux valgus of right foot 12/04/2015  . High risk medication use 06/24/2015  . Hx of valvular heart disease  01/29/2016  . Hypertension 02/20/2014  . Hypothyroidism (acquired) 03/09/2012   Last Assessment & Plan:  She was due for this to be updated and have advised her to get this drawn while here today  . IBS (irritable bowel syndrome) 06/24/2015   Last Assessment & Plan:  Relevant Hx: Course: Daily Update: Today's Plan:this is stable for her  Electronically signed by: Mayer Camel, NP 06/26/15 1531  . Idiopathic progressive polyneuropathy 06/07/2009  . Left bundle branch block 06/24/2015  . Leukocytosis 06/24/2015   Last Assessment & Plan:  Relevant Hx: Course: Daily Update: Today's Plan:this has been stable for her with FU through hematology who felt she was having benign swings in this and she chose to not have bone marrow biopsy done   Electronically signed by: Mayer Camel, NP 06/27/15 [phone]  . Liposarcoma, well differentiated type ([place]) 03/09/2012   Overview:  Initial excision 03/18/2000 (Dr. Ralene Cork [facility]), 8 x 6 x  3 cm. Local recurrence, re-excision 03/23/2007 [facility]), 5 x 3 x 2 cm. Surveillance plan: baseline MRI April 2014  Last Assessment & Plan:  Relevant Hx: Course: Daily Update: Today's Plan:she is stable from this  Electronically signed by: Mayer Camel, NP 06/26/15 1533  . Lumbar degenerative disc disease 11/06/2015   Last Assessment & Plan:  At this point she needs updated MRI of her lumbar spine and we discussed with her gait abnormality she is likely going to have to see specialist at [facility] for this, she has seen local neuro for this several years ago in terms of her neuropathy and her falls and he really did not address it . She was advised several years ago to have back surgery locally which we did not feel was ideal with her sugars at the time being so out of control and now she is really more in control than then and it may be doable for her if felt  Needed though she would have to have cardiology clear her as well.  . Mild CAD  07/29/2015   Overview:  Overview:  Cath march 2016 with 60% LAD with normal FFR   Overview:  Cath march 2016 with 60% LAD with normal FFR   . Mixed hyperlipidemia 06/24/2015   Last Assessment & Plan:  Relevant Hx: Course: Daily Update: Today's Plan:update her lipids for her today and she is trying to watch her diet   Electronically signed by: Mayer Camel, NP 06/27/15 0813  . Obstructive sleep apnea syndrome 06/24/2015  . Onychomycosis due to dermatophyte 11/27/2015  . 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   Last Assessment & Plan:  Relevant Hx: Course: Daily Update: Today's Plan:she is doing well overall  Electronically signed by: Mayer Camel, NP 06/26/15 1531  . Pre-ulcerative calluses 12/04/2015  . Pre-ulcerative corn or callous 11/27/2015  . Primary osteoarthritis involving multiple joints 07/06/2015   Last Assessment & Plan:  Relevant Hx: Course: Daily Update: Today's Plan:she has DDD of her back with some spinal  Stenosis that she has seen ortho locally for and he wanted to do surgery which she has avoided and I agree with, she is going to use the flexeril for this right now as she is having more muscle spasm than what she has had in the past from her attempt to walk down some very steep steps that she was not used to . She should use her walker for support through the weekend and avoid overhead strain and lifting and stay in touch if not better.  Electronically signed by: Mayer Camel, NP 07/06/15 [phone]  . Stress incontinence of urine 04/17/2016  . Syncope and collapse 10/13/2016  . TIA (transient ischemic attack) 10/13/2016  . Type 2 diabetes mellitus without complication, without long-term current use of insulin ([place]) 03/09/2012   Last Assessment & Plan:  She brings in readings of her sugars which show she has been doing much better overall with this at home and quite pleased with her  . Vascular calcification 01/29/2016  . Visual changes 01/29/2016   Overview:  Following  with Dr. Julio Alm as well as had recent carotid doppler showing minimal plaque present and vertebral arteries normal , nothing signficant  . Vitamin B12 deficiency 12/04/2015    Past Surgical History:  Procedure Laterality Date  . ABDOMINAL HYSTERECTOMY    . CHOLECYSTECTOMY    . EYE SURGERY    . PANCREAS SURGERY    . skin cancer lesion      Current Medications:  Current Meds  Medication Sig  . Alirocumab 150 MG/ML SOPN Inject 150 mg into the skin every 14 (fourteen) days.  . ALPRAZolam (XANAX) 0.5 MG tablet Take 0.5 mg by mouth 3 (three) times daily as needed for anxiety.   Marland Kitchen amLODipine (NORVASC) 5 MG tablet Take 5 mg by mouth daily.   Marland Kitchen aspirin EC 81 MG tablet Take 81 mg by mouth daily.  . benazepril (LOTENSIN) 20 MG tablet Take 20 mg by mouth daily.  . betamethasone dipropionate (DIPROLENE) 0.05 % cream APPLY TO AFFECTED AREAS TOPICALLY TWICE DAILY AS NEEDED  . Blood Glucose Monitoring Suppl (ACCU-CHEK AVIVA PLUS) w/Device KIT See admin instructions.  . donepezil (ARICEPT) 5 MG tablet Take 5 mg by mouth at bedtime.  . fluticasone (FLONASE) 50 MCG/ACT nasal spray Place 1 spray into the nose 2 (two) times daily.  . furosemide (LASIX) 40 MG tablet Take 1 tablet (40 mg total) by mouth 2 (two) times daily.  Marland Kitchen gabapentin (NEURONTIN) 300 MG capsule Take 300 mg by mouth daily.  Marland Kitchen glimepiride (AMARYL) 1 MG tablet Take 0.5 tablets by mouth daily.  Marland Kitchen levothyroxine (SYNTHROID, LEVOTHROID) 75 MCG tablet Take 75 mcg by mouth daily.  Marland Kitchen loratadine (CLARITIN) 10 MG tablet Take 10 mg by mouth at bedtime.  . metFORMIN (GLUCOPHAGE-XR) 500 MG 24 hr tablet Take 500 mg by mouth 2 (two) times daily.  . metoprolol tartrate (LOPRESSOR) 50 MG tablet Take 50 mg by mouth 2 (two) times daily.  . montelukast (SINGULAIR) 10 MG tablet Take 10 mg by mouth at bedtime.  . nitroGLYCERIN (NITROSTAT) 0.4 MG SL tablet Place 1 tablet (0.4 mg total) under the tongue every 5 (five) minutes x 3 doses as needed for chest pain.    . polyethylene glycol powder (GLYCOLAX/MIRALAX) powder TAKE 17 GRAMS BY MOUTH TWICE A DAY  . ranitidine (ZANTAC) 150 MG tablet Take 150 mg by mouth daily.  . silver sulfADIAZINE (SILVADENE) 1 % cream APPLY TO AFFECTED AREAS TOPICALLY TWICE DAILY AS NEEDED  . SYMBICORT 160-4.5 MCG/ACT inhaler Inhale 2 puffs into the lungs 2 (two) times daily.     Allergies:   Nitrofurantoin and Statins   Social History   Socioeconomic History  . Marital status: Married    Spouse name: None  . Number of children: None  . Years of education: None  . Highest education level: None  Social Needs  . Financial resource strain: None  . Food insecurity - worry: None  . Food insecurity - inability: None  . Transportation needs - medical: None  . Transportation needs - non-medical: None  Occupational History  . None  Tobacco Use  . Smoking status: Never Smoker  . Smokeless tobacco: Never Used  Substance and Sexual Activity  . Alcohol use: No  . Drug use: No  . Sexual activity: None  Other Topics Concern  . None  Social History Narrative  . None     Family History: The patient's family history includes Cancer in her sister; Diabetes in her mother; Hypertension in her father and mother. ROS:   Please see the history of present illness.    All other systems reviewed and are negative.  EKGs/Labs/Other Studies Reviewed:    The following studies were reviewed today:  EKG: June 2018 sinus rhythm left bundle branch block Echo TTE 10/13/16: Conclusions Summary Normal left ventricular systolic function There is mild aortic sclerosis noted, with no evidence of stenosis. Normal right ventricular size and function. Recent Labs: 06/17/17 CMP normal No results  found for requested labs within last 8760 hours.  Recent Lipid Panel 06/17/17 Chol 133 HDl 63 LDL 57    Component Value Date/Time   CHOL 150 12/10/2016 0000   TRIG 90 12/10/2016 0000   HDL 69 12/10/2016 0000   CHOLHDL 2.2 12/10/2016 0000    LDLCALC 63 12/10/2016 0000    Physical Exam:    VS:  BP (!) 164/76 (BP Location: Right Arm, Patient Position: Sitting, Cuff Size: Large)   Pulse (!) 56   Ht _0  (1.626 m)   Wt 138 lb 12.8 oz (63 kg)   SpO2 99%   BMI 23.82 kg/m     Wt Readings from Last 3 Encounters:  07/07/17 138 lb 12.8 oz (63 kg)  12/10/16 132 lb (59.9 kg)     GEN:  Well nourished, well developed in no acute distress HEENT: Normal NECK: No JVD; No carotid bruits LYMPHATICS: No lymphadenopathy CARDIAC: RRR, no murmurs, rubs, gallops RESPIRATORY:  Clear to auscultation without rales, wheezing or rhonchi  ABDOMEN: Soft, non-tender, non-distended MUSCULOSKELETAL:  No edema; No deformity  SKIN: Warm and dry NEUROLOGIC:  Alert and oriented x 3 PSYCHIATRIC:  Normal affect    Signed, Shirlee More, MD  07/07/2017 2:44 PM    [place]

## 2017-07-07 ENCOUNTER — Ambulatory Visit (INDEPENDENT_AMBULATORY_CARE_PROVIDER_SITE_OTHER): Payer: Medicare Other | Admitting: Cardiology

## 2017-07-07 ENCOUNTER — Encounter: Payer: Self-pay | Admitting: Cardiology

## 2017-07-07 VITALS — BP 164/76 | HR 56 | Ht 64.0 in | Wt 138.8 lb

## 2017-07-07 DIAGNOSIS — I110 Hypertensive heart disease with heart failure: Secondary | ICD-10-CM | POA: Diagnosis not present

## 2017-07-07 DIAGNOSIS — I447 Left bundle-branch block, unspecified: Secondary | ICD-10-CM

## 2017-07-07 DIAGNOSIS — I5032 Chronic diastolic (congestive) heart failure: Secondary | ICD-10-CM | POA: Diagnosis not present

## 2017-07-07 DIAGNOSIS — I2510 Atherosclerotic heart disease of native coronary artery without angina pectoris: Secondary | ICD-10-CM

## 2017-07-07 NOTE — Patient Instructions (Signed)

## 2017-07-22 ENCOUNTER — Encounter: Payer: Self-pay | Admitting: Gastroenterology

## 2017-08-25 ENCOUNTER — Ambulatory Visit (INDEPENDENT_AMBULATORY_CARE_PROVIDER_SITE_OTHER): Payer: Medicare Other | Admitting: Gastroenterology

## 2017-08-25 ENCOUNTER — Encounter: Payer: Self-pay | Admitting: Gastroenterology

## 2017-08-25 VITALS — BP 136/72 | HR 65 | Ht 64.0 in | Wt 139.0 lb

## 2017-08-25 DIAGNOSIS — K589 Irritable bowel syndrome without diarrhea: Secondary | ICD-10-CM | POA: Diagnosis not present

## 2017-08-25 DIAGNOSIS — C254 Malignant neoplasm of endocrine pancreas: Secondary | ICD-10-CM

## 2017-08-25 NOTE — Progress Notes (Signed)
Chief Complaint: FU  Referring Provider:  Raina Mina., MD      ASSESSMENT AND PLAN;   #1. 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  Negative multiple CT scans of the abdomen thereafter for recurrence.   #2.  IBS with history of alternating diarrhea and constipation.   HPI:   84 year old very pleasant white female who is doing very well from the GI standpoint.  She would only have diarrhea when she would eat cereal.  Otherwise, she denies having any symptoms.  Her constipation is under good control with diet. No nausea, vomiting, heartburn, regurgitation, odynophagia or dysphagia.  She has stopped taking omeprazole as she had " locked hands".  She does not want to undergo another CT scan of the abdomen and pelvis or EGD or colonoscopy.  Past GI history and procedures: 1.  History of autoimmune hepatitis 2013 with positive anti-smooth muscle antibody, status post liver biopsy, treated with Imuran and prednisone (RESOLVED). 2.  Pancolonic diverticulosis -last colonoscopy 12/01/2011, small hyperplastic colonic polyp, pancolonic diverticulosis predominantly in the sigmoid colon, internal hemorrhoids. 3.  History of noncardiac chest pains -last EGD 08/03/2014- negative   Past Medical History:  Diagnosis Date  . Abnormal gait 06/07/2009   Last Assessment & Plan:  She saw specialist who has given her orders for PT and she is wanting to have it setup for her at [facility] where she already is receiving care from at home  . Acid reflux 02/20/2014  . Aftercare following surgery 11/27/2015  . Anemia of unknown etiology 10/18/2016  . Anxiety 02/20/2014  . Atrial fibrillation ([place])   . CAD (coronary artery disease)   . Cerebral ischemia 03/11/2016   Overview:  Microvascular ischemia seen on MRI 01/2015 with partial empty sella turcica neurology appt 03/12/2016 pending with [facility], also has [facility] with PT coming to the house for her gait  abnormality and lumbar DDD, as well as local ortho (d[place]ranni)   . CHF (congestive heart failure) ([place]) 06/24/2015   Last Assessment & Plan:  Relevant Hx: Course: Daily Update: Today's Plan:she has been doing well overall from her heart and no SOB  Electronically signed by: Mayer Camel, NP 06/26/15 1522  . Chronic UTI 06/27/2015   Last Assessment & Plan:  She requested a UA be checked for her as she was having more frequency than before and it has been awhile since she was on oral abx for this, she is on low dose maintenance med to prevent and has done well with that.  . Colon polyps   . COPD (chronic obstructive pulmonary disease) ([place]) 03/09/2012   Last Assessment & Plan:  She feels she is stable from this discussed with her the allergies she has and she is going to discuss with Dr. Alcide Clever having allergy injections as well for this   . Diabetes ([place]) 02/20/2014  . Dizziness 02/07/2016   Overview:  Chronic and has been having falls, MRI showing chronic microvascular ischemia, as well as partial sella turcica pending neuro appt for her with [facility]  . Dyslipidemia 07/29/2015  . Edema 06/24/2015   Last Assessment & Plan:  Relevant Hx: Course: Daily Update: Today's Plan:this is stable for her overall and will follow for her  Electronically signed by: Mayer Camel, NP 06/27/15 0813  . Elevated cholesterol   . Essential hypertension 03/09/2012   Last Assessment & Plan:  On repeat this was stable for her and she  does not check it at home much but when has been checked has been fine for her  . Fistula 02/20/2014  . Gastroesophageal reflux disease without esophagitis 02/20/2014   Last Assessment & Plan:  Relevant Hx: Course: Daily Update: Today's Plan:not on meds for this as she has had issues with her arms cramping and she is going to just drink buttermilk  Electronically signed by: Mayer Camel, NP 06/26/15 1530  . Generalized abdominal pain 10/24/2016  . Hallux valgus  of left foot 12/04/2015  . Hallux valgus of right foot 12/04/2015  . High risk medication use 06/24/2015  . Hx of valvular heart disease 01/29/2016  . Hypertension 02/20/2014  . Hypothyroidism (acquired) 03/09/2012   Last Assessment & Plan:  She was due for this to be updated and have advised her to get this drawn while here today  . IBS (irritable bowel syndrome) 06/24/2015   Last Assessment & Plan:  Relevant Hx: Course: Daily Update: Today's Plan:this is stable for her  Electronically signed by: Mayer Camel, NP 06/26/15 1531  . IBS (irritable bowel syndrome)   . Idiopathic progressive polyneuropathy 06/07/2009  . Left bundle branch block 06/24/2015  . Leukocytosis 06/24/2015   Last Assessment & Plan:  Relevant Hx: Course: Daily Update: Today's Plan:this has been stable for her with FU through hematology who felt she was having benign swings in this and she chose to not have bone marrow biopsy done   Electronically signed by: Mayer Camel, NP 06/27/15 [phone]  . 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 Ralene [facility]), 8 x 6 x 3 cm. Local recurrence, re-excision 03/23/2007 Candiss Norse), 5 x 3 x 2 cm. Surveillance plan: baseline MRI April 2014  Last Assessment & Plan:  Relevant Hx: Course: Daily Update: Today's Plan:she is stable from this  Electronically signed by: Mayer Camel, NP 06/26/15 1533  . Lumbar degenerative disc disease 11/06/2015   Last Assessment & Plan:  At this point she needs updated MRI of her lumbar spine and we discussed with her gait abnormality she is likely going to have to see specialist at [facility] for this, she has seen local neuro for this several years ago in terms of her neuropathy and her falls and he really did not address it . She was advised several years ago to have back surgery locally which we did not feel was ideal with her sugars at the time being so out of  control and now she is really more in control than then and it may be doable for her if felt  Needed though she would have to have cardiology clear her as well.  . Mild CAD 07/29/2015   Overview:  Overview:  Cath march 2016 with 60% LAD with normal FFR   Overview:  Cath march 2016 with 60% LAD with normal FFR   . Mixed hyperlipidemia 06/24/2015   Last Assessment & Plan:  Relevant Hx: Course: Daily Update: Today's Plan:update her lipids for her today and she is trying to watch her diet   Electronically signed by: Mayer Camel, NP 06/27/15 0813  . Obstructive sleep apnea syndrome 06/24/2015  . Onychomycosis due to dermatophyte 11/27/2015  . 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   Last Assessment & Plan:  Relevant Hx: Course: Daily Update: Today's Plan:she is doing well overall  Electronically signed by: Mayer Camel, NP 06/26/15 1531  . Pancreatitis   . Pre-ulcerative calluses 12/04/2015  . Pre-ulcerative corn  or callous 11/27/2015  . Primary osteoarthritis involving multiple joints 07/06/2015   Last Assessment & Plan:  Relevant Hx: Course: Daily Update: Today's Plan:she has DDD of her back with some spinal  Stenosis that she has seen ortho locally for and he wanted to do surgery which she has avoided and I agree with, she is going to use the flexeril for this right now as she is having more muscle spasm than what she has had in the past from her attempt to walk down some very steep steps that she was not used to . She should use her walker for support through the weekend and avoid overhead strain and lifting and stay in touch if not better.  Electronically signed by: Mayer Camel, NP 07/06/15 [phone]  . Sleep apnea   . Stress incontinence of urine 04/17/2016  . Syncope and collapse 10/13/2016  . Thyroid disease   . TIA (transient ischemic attack) 10/13/2016  . Type 2 diabetes mellitus without complication, without long-term current use of insulin ([place]) 03/09/2012   Last Assessment &  Plan:  She brings in readings of her sugars which show she has been doing much better overall with this at home and quite pleased with her  . UTI (urinary tract infection)   . Vascular calcification 01/29/2016  . Visual changes 01/29/2016   Overview:  Following with Dr. Julio Alm as well as had recent carotid doppler showing minimal plaque present and vertebral arteries normal , nothing signficant  . Vitamin B12 deficiency 12/04/2015    Past Surgical History:  Procedure Laterality Date  . ABDOMINAL HYSTERECTOMY    . CHOLECYSTECTOMY    . EYE SURGERY    . PANCREAS SURGERY    . skin cancer lesion      Family History  Problem Relation Age of Onset  . Diabetes Mother   . Hypertension Mother   . Cancer Sister   . Hypertension Father     Social History   Tobacco Use  . Smoking status: Never Smoker  . Smokeless tobacco: Never Used  Substance Use Topics  . Alcohol use: No  . Drug use: No    Current Outpatient Medications  Medication Sig Dispense Refill  . Alirocumab 150 MG/ML SOPN Inject 150 mg into the skin every 14 (fourteen) days. 2 pen 11  . ALPRAZolam (XANAX) 0.5 MG tablet Take 0.5 mg by mouth 3 (three) times daily as needed for anxiety.     Marland Kitchen amLODipine (NORVASC) 5 MG tablet Take 5 mg by mouth daily.     Marland Kitchen aspirin EC 81 MG tablet Take 81 mg by mouth daily.    . benazepril (LOTENSIN) 20 MG tablet Take 20 mg by mouth daily.  5  . betamethasone dipropionate (DIPROLENE) 0.05 % cream APPLY TO AFFECTED AREAS TOPICALLY TWICE DAILY AS NEEDED    . Blood Glucose Monitoring Suppl (ACCU-CHEK AVIVA PLUS) w/Device KIT See admin instructions.  0  . donepezil (ARICEPT) 5 MG tablet Take 5 mg by mouth at bedtime.  5  . fluticasone (FLONASE) 50 MCG/ACT nasal spray Place 1 spray into the nose 2 (two) times daily.    . furosemide (LASIX) 40 MG tablet Take 1 tablet (40 mg total) by mouth 2 (two) times daily. 180 tablet 3  . gabapentin (NEURONTIN) 300 MG capsule Take 300 mg by mouth daily.  11  .  glimepiride (AMARYL) 1 MG tablet Take 0.5 tablets by mouth daily.  4  . levothyroxine (SYNTHROID, LEVOTHROID) 75 MCG tablet Take 75 mcg  by mouth daily.  2  . loratadine (CLARITIN) 10 MG tablet Take 10 mg by mouth at bedtime.  3  . metFORMIN (GLUCOPHAGE-XR) 500 MG 24 hr tablet Take 500 mg by mouth 2 (two) times daily.  2  . metoprolol tartrate (LOPRESSOR) 50 MG tablet Take 50 mg by mouth 2 (two) times daily.    . Mirabegron (MYRBETRIQ PO) Take 50 mg by mouth daily.    . montelukast (SINGULAIR) 10 MG tablet Take 10 mg by mouth at bedtime.  3  . nitroGLYCERIN (NITROSTAT) 0.4 MG SL tablet Place 1 tablet (0.4 mg total) under the tongue every 5 (five) minutes x 3 doses as needed for chest pain. 25 tablet 11  . polyethylene glycol powder (GLYCOLAX/MIRALAX) powder TAKE 17 GRAMS BY MOUTH TWICE A DAY  1  . ranitidine (ZANTAC) 150 MG tablet Take 150 mg by mouth daily.  11  . silver sulfADIAZINE (SILVADENE) 1 % cream APPLY TO AFFECTED AREAS TOPICALLY TWICE DAILY AS NEEDED  3  . SYMBICORT 160-4.5 MCG/ACT inhaler Inhale 2 puffs into the lungs 2 (two) times daily.  2  . trimethoprim (TRIMPEX) 100 MG tablet TAKE 2 TABLETS BY MOUTH EVERY DAY AT BEDTIME  6   No current facility-administered medications for this visit.     Allergies  Allergen Reactions  . Nitrofurantoin     Other reaction(s): Cramps (ALLERGY/intolerance), Fever (intolerance), GI Upset (intolerance) Fever, weakness, nausea Fever, weakness, nausea  . Statins     Other reaction(s): Other (See Comments) pancreatitis    Review of Systems:  Constitutional: Denies fever, chills, diaphoresis, appetite change and fatigue.  HEENT: Denies photophobia, eye pain, redness, hearing loss, ear pain, congestion, sore throat, rhinorrhea, sneezing, mouth sores, neck pain, neck stiffness and tinnitus.   Respiratory: Denies SOB, DOE, cough, chest tightness,  and wheezing.   Cardiovascular: Denies chest pain, palpitations and leg swelling.  Genitourinary:  Denies dysuria, urgency, frequency, hematuria, flank pain and difficulty urinating.  Musculoskeletal: Denies myalgias, back pain, joint swelling, arthralgias and gait problem.  Skin: No rash.  Neurological: Denies dizziness, seizures, syncope, weakness, light-headedness, numbness and headaches.  Hematological: Denies adenopathy. Easy bruising, personal or family bleeding history  Psychiatric/Behavioral: No anxiety or depression     Physical Exam:    BP 136/72   Pulse 65   Ht _0  (1.626 m)   Wt 139 lb (63 kg)   SpO2 97%   BMI 23.86 kg/m  Filed Weights   08/25/17 1522  Weight: 139 lb (63 kg)   Constitutional:  Well-developed, in no acute distress. Psychiatric: Normal mood and affect. Behavior is normal. HEENT: Pupils normal.  Conjunctivae are normal. No scleral icterus. Neck supple.  Cardiovascular: Normal rate, regular rhythm. No edema Pulmonary/chest: Effort normal and breath sounds normal. No wheezing, rales or rhonchi. Abdominal: Soft, nondistended. Nontender. Bowel sounds active throughout. There are no masses palpable. No hepatomegaly. Rectal:  defered Neurological: Alert and oriented to person place and time. Skin: Skin is warm and dry. No rashes noted.  Data Reviewed: I have personally reviewed following labs and imaging studies  CBC: No flowsheet data found.  CMP: No flowsheet data found.  GFR: CrCl cannot be calculated (No order found.). Liver Function Tests: No results for input(s): AST, ALT, ALKPHOS, BILITOT, PROT, ALBUMIN in the last 168 hours. No results for input(s): LIPASE, AMYLASE in the last 168 hours. No results for input(s): AMMONIA in the last 168 hours. Coagulation Profile: No results for input(s): INR, PROTIME in the last 168 hours.  HbA1C: No results for input(s): HGBA1C in the last 72 hours. Lipid Profile: No results for input(s): CHOL, HDL, LDLCALC, TRIG, CHOLHDL, LDLDIRECT in the last 72 hours. Thyroid Function Tests: No results for  input(s): TSH, T4TOTAL, FREET4, T3FREE, THYROIDAB in the last 72 hours. Anemia Panel: No results for input(s): VITAMINB12, FOLATE, FERRITIN, TIBC, IRON, RETICCTPCT in the last 72 hours.  No results found for this or any previous visit (from the past 240 hour(s)).    Radiology Studies: No results found.    Carmell Austria, MD   Cc: Raina Mina., MD

## 2017-08-25 NOTE — Patient Instructions (Signed)
If you are age 82 or older, your body mass index should be between 23-30. Your Body mass index is 23.86 kg/m. If this is out of the aforementioned range listed, please consider follow up with your Primary Care Provider.  If you are age 32 or younger, your body mass index should be between 19-25. Your Body mass index is 23.86 kg/m. If this is out of the aformentioned range listed, please consider follow up with your Primary Care Provider.   Follow-Up with [place] as needed.  Thank you for choosing [facility], Carmell Austria, MD

## 2018-01-25 ENCOUNTER — Telehealth: Payer: Self-pay

## 2018-01-25 ENCOUNTER — Other Ambulatory Visit: Payer: Self-pay

## 2018-01-25 MED ORDER — FUROSEMIDE 40 MG PO TABS: 40.0000 mg | ORAL_TABLET | Freq: Two times a day (BID) | ORAL | 3 refills | Status: DC

## 2018-01-25 NOTE — Addendum Note (Signed)
Addended by: Stevan Born on: 01/25/2018 10:11 AM   Modules accepted: Orders

## 2018-01-25 NOTE — Telephone Encounter (Signed)
Rx sent to pharmacy as requested.

## 2018-02-24 DIAGNOSIS — E1122 Type 2 diabetes mellitus with diabetic chronic kidney disease: Secondary | ICD-10-CM

## 2018-02-24 DIAGNOSIS — N183 Chronic kidney disease, stage 3 unspecified: Secondary | ICD-10-CM | POA: Insufficient documentation

## 2018-02-24 DIAGNOSIS — N1831 Chronic kidney disease, stage 3a: Secondary | ICD-10-CM | POA: Insufficient documentation

## 2018-02-24 HISTORY — DX: Type 2 diabetes mellitus with diabetic chronic kidney disease: E11.22

## 2018-02-24 HISTORY — DX: Chronic kidney disease, stage 3a: N18.31

## 2018-02-24 HISTORY — DX: Chronic kidney disease, stage 3 unspecified: N18.30

## 2018-06-02 ENCOUNTER — Telehealth: Payer: Self-pay | Admitting: Cardiology

## 2018-06-02 ENCOUNTER — Other Ambulatory Visit: Payer: Self-pay

## 2018-06-02 NOTE — Telephone Encounter (Signed)
°*  STAT* If patient is at the pharmacy, call can be transferred to refill team.   1. Which medications need to be refilled? (please list name of each medication and dose if known) praluent Inj  2. Which pharmacy/location (including street and city if local pharmacy) is medication to be sent to? [facility]  3. Do they need a 30 day or 90 day supply? Refills  Patient has now changed to [facility]

## 2018-06-03 MED ORDER — ALIROCUMAB 150 MG/ML ~~LOC~~ SOAJ: 1.0000 | SUBCUTANEOUS | 0 refills | Status: DC

## 2018-06-03 NOTE — Addendum Note (Signed)
Addended by: Austin Miles on: 06/03/2018 03:11 PM   Modules accepted: Orders

## 2018-06-03 NOTE — Telephone Encounter (Signed)
30 day supply of praluent sent to [facility] with no refills. Patient will receive further refills during follow up appointment on 06/30/2018.

## 2018-06-30 ENCOUNTER — Ambulatory Visit: Payer: Medicare Other | Admitting: Cardiology

## 2018-07-03 ENCOUNTER — Other Ambulatory Visit: Payer: Self-pay | Admitting: Cardiology

## 2018-07-26 ENCOUNTER — Telehealth: Payer: Self-pay

## 2018-07-26 NOTE — Telephone Encounter (Signed)
   Cardiac Questionnaire:    Since your last visit or hospitalization:    1. Have you been having new or worsening chest pain? No   2. Have you been having new or worsening shortness of breath? No 3. Have you been having new or worsening leg swelling, wt gain, or increase in abdominal girth (pants fitting more tightly)? Bilateral lower extremity edema   4. Have you had any passing out spells? No    *A YES to any of these questions would result in the appointment being kept. *If all the answers to these questions are NO, we should indicate that given the current situation regarding the worldwide coronarvirus pandemic, at the recommendation of the CDC, we are looking to limit gatherings in our waiting area, and thus will reschedule their appointment beyond four weeks from today.   _____________   [id number] Pre-Screening Questions:  . Do you currently have a fever? No . Have you recently travelled on a cruise, internationally, or to [place], [place], [place], [place], [place], or [place], [place] [employer]) ? No . Have you been in contact with someone that is currently pending confirmation of Covid19 testing or has been confirmed to have the [place] virus?  No . Are you currently experiencing fatigue or cough? Yes, patient reports that she has a sinus infection currently.

## 2018-07-28 ENCOUNTER — Encounter: Payer: Self-pay | Admitting: Cardiology

## 2018-07-28 ENCOUNTER — Telehealth (INDEPENDENT_AMBULATORY_CARE_PROVIDER_SITE_OTHER): Payer: Medicare Other | Admitting: Cardiology

## 2018-07-28 VITALS — BP 174/86 | HR 62 | Ht 64.0 in | Wt 143.6 lb

## 2018-07-28 DIAGNOSIS — I5032 Chronic diastolic (congestive) heart failure: Secondary | ICD-10-CM

## 2018-07-28 DIAGNOSIS — I2510 Atherosclerotic heart disease of native coronary artery without angina pectoris: Secondary | ICD-10-CM | POA: Diagnosis not present

## 2018-07-28 DIAGNOSIS — I110 Hypertensive heart disease with heart failure: Secondary | ICD-10-CM

## 2018-07-28 DIAGNOSIS — I447 Left bundle-branch block, unspecified: Secondary | ICD-10-CM

## 2018-07-28 DIAGNOSIS — E782 Mixed hyperlipidemia: Secondary | ICD-10-CM

## 2018-07-28 MED ORDER — NITROGLYCERIN 0.4 MG SL SUBL: 0.4000 mg | SUBLINGUAL_TABLET | SUBLINGUAL | 11 refills | Status: DC | PRN

## 2018-07-28 NOTE — Patient Instructions (Signed)
Medication Instructions:  Your physician recommends that you continue on your current medications as directed. Please refer to the Current Medication list given to you today.  Please pick up your Nitroglycerin at [facility]  If you need a refill on your cardiac medications before your next appointment, please call your pharmacy.   Lab work: NONE If you have labs (blood work) drawn today and your tests are completely normal, you will receive your results only by: Marland Kitchen MyChart Message (if you have MyChart) OR . A paper copy in the mail If you have any lab test that is abnormal or we need to change your treatment, we will call you to review the results.  Testing/Procedures: NONE  Follow-Up: At [facility], you and your health needs are our priority.  As part of our continuing mission to provide you with exceptional heart care, we have created designated Provider Care Teams.  These Care Teams include your primary Cardiologist (physician) and Advanced Practice Providers (APPs -  Physician Assistants and Nurse Practitioners) who all work together to provide you with the care you need, when you need it. You will need a follow up appointment with Dr Bettina Gavia on 11-29-2018 at 10:40am.  Please arrive at 10:20 for check in.

## 2018-07-28 NOTE — Progress Notes (Signed)
Virtual Visit via Video Note    Evaluation Performed:  Follow-up visit  This visit type was conducted due to national recommendations for restrictions regarding the COVID-19 Pandemic (e.g. social distancing).  This format is felt to be most appropriate for this patient at this time.  All issues noted in this document were discussed and addressed.  No physical exam was performed (except for noted visual exam findings with Video Visits).  Please refer to the patient's chart (MyChart message for video visits and phone note for telephone visits) for the patient's consent to telehealth for [facility].  Date:  07/28/2018   ID:  Wendy Leon, DOB [birth year], MRN [id number]  Patient Location:  Home  Provider location:   [place] [facility]  PCP:  Raina Mina., MD  Cardiologist:  No primary care provider on file. Dr Bettina Gavia Electrophysiologist:  None   Chief Complaint:  FU for CAD and heart failure  History of Present Illness:    Wendy Leon is a 85 y.o. female who presents via audio/video conferencing for a telehealth visit today.  Scheduled cardiology FU.  The patient does not have symptoms concerning for COVID-19 infection (fever, chills, cough, or new shortness of breath).   Wendy Leon is a 85 y.o. female with a hx of pancreatic cancer,mild CAD 2016 with 60% LAD with normal FFR , Dyslipidemia with statin intolerance on Praluent , HTN orthostatic hypotension and costochondral chest pain  last seen 07/07/17  The technology was low challenging voice did not work with the WebEx and I use the phone for audio.  Fortunately her daughter was present.  She seems to be on a plateau she has dementia she uses a walker for safety and she is very well supervised by her family who provide meals.  She has had no fever cough or change in shortness of breath.  At times when she walks a longer distance she finds her self breathless and intermittently has more lower extremity edema she does not add salt to her  diet but she no longer weighs daily.  Home blood pressure checks according to the patient and family have been in range and we have allowed blood pressure up to 588 systolic because of previous symptomatic orthostatic hypotension with falls and syncope.  She says rarely she gets a discomfort in her chest that will wake her at nighttime but it never lasts long enough to take nitroglycerin she describes as a pressure left chest no radiation mild to moderate severity no associated shortness of breath diaphoresis or nausea she does not have exertional chest pain and she said the episodes are very rare in frequency.  We will send a new prescription for nitroglycerin.  She has had no palpitations syncope or TIA.  Medications were reviewed she continues to take PCSK9 with hyperlipidemia and CAD and absolute statin intolerance.  Prior CV studies:   The following studies were reviewed today:  Recent labs from PCP office 06/23/2018 BMP was normal potassium 4.1 creatinine 0.96 last A1c 7.6% her last lipid profile 09/14/2017 cholesterol 150 HDL 72 LDL at target 71 and her TSH was normal.  Past Medical History:  Diagnosis Date   Abnormal gait 06/07/2009   Last Assessment & Plan:  She saw specialist who has given her orders for PT and she is wanting to have it setup for her at [facility] where she already is receiving care from at home   Acid reflux 02/20/2014   Aftercare following surgery 11/27/2015   Anemia of  unknown etiology 10/18/2016   Anxiety 02/20/2014   Atrial fibrillation (HCC)    CAD (coronary artery disease)    Cerebral ischemia 03/11/2016   Overview:  Microvascular ischemia seen on MRI 01/2015 with partial empty sella turcica neurology appt 03/12/2016 pending with [facility], also has [facility] with PT coming to the house for her gait abnormality and lumbar DDD, as well as local ortho (durranni)    CHF (congestive heart failure) ([place]) 06/24/2015   Last Assessment & Plan:  Relevant Hx: Course:  Daily Update: Today's Plan:she has been doing well overall from her heart and no SOB  Electronically signed by: Mayer Camel, NP 06/26/15 1522   Chronic UTI 06/27/2015   Last Assessment & Plan:  She requested a UA be checked for her as she was having more frequency than before and it has been awhile since she was on oral abx for this, she is on low dose maintenance med to prevent and has done well with that.   Colon polyps    COPD (chronic obstructive pulmonary disease) ([place]) 03/09/2012   Last Assessment & Plan:  She feels she is stable from this discussed with her the allergies she has and she is going to discuss with Dr. Alcide Clever having allergy injections as well for this    Diabetes ([place]) 02/20/2014   Dizziness 02/07/2016   Overview:  Chronic and has been having falls, MRI showing chronic microvascular ischemia, as well as partial sella turcica pending neuro appt for her with [facility]   Dyslipidemia 07/29/2015   Edema 06/24/2015   Last Assessment & Plan:  Relevant Hx: Course: Daily Update: Today's Plan:this is stable for her overall and will follow for her  Electronically signed by: Mayer Camel, NP 06/27/15 0813   Elevated cholesterol    Essential hypertension 03/09/2012   Last Assessment & Plan:  On repeat this was stable for her and she does not check it at home much but when has been checked has been fine for her   Fistula 02/20/2014   Gastroesophageal reflux disease without esophagitis 02/20/2014   Last Assessment & Plan:  Relevant Hx: Course: Daily Update: Today's Plan:not on meds for this as she has had issues with her arms cramping and she is going to just drink buttermilk  Electronically signed by: Mayer Camel, NP 06/26/15 1530   Generalized abdominal pain 10/24/2016   Hallux valgus of left foot 12/04/2015   Hallux valgus of right foot 12/04/2015   High risk medication use 06/24/2015   Hx of valvular heart disease 01/29/2016   Hypertension  02/20/2014   Hypothyroidism (acquired) 03/09/2012   Last Assessment & Plan:  She was due for this to be updated and have advised her to get this drawn while here today   IBS (irritable bowel syndrome) 06/24/2015   Last Assessment & Plan:  Relevant Hx: Course: Daily Update: Today's Plan:this is stable for her  Electronically signed by: Mayer Camel, NP 06/26/15 1531   IBS (irritable bowel syndrome)    Idiopathic progressive polyneuropathy 06/07/2009   Left bundle branch block 06/24/2015   Leukocytosis 06/24/2015   Last Assessment & Plan:  Relevant Hx: Course: Daily Update: Today's Plan:this has been stable for her with FU through hematology who felt she was having benign swings in this and she chose to not have bone marrow biopsy done   Electronically signed by: Mayer Camel, NP 06/27/15 [phone]   Liposarcoma, 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 Ralene Cork [facility]), 8 x 6 x 3 cm. Local recurrence, re-excision 03/23/2007 [facility]se), 5 x 3 x 2 cm. Surveillance plan: baseline MRI April 2014  Last Assessment & Plan:  Relevant Hx: Course: Daily Update: Today's Plan:she is stable from this  Electronically signed by: Mayer Camel, NP 06/26/15 1533   Lumbar degenerative disc disease 11/06/2015   Last Assessment & Plan:  At this point she needs updated MRI of her lumbar spine and we discussed with her gait abnormality she is likely going to have to see specialist at [facility] for this, she has seen local neuro for this several years ago in terms of her neuropathy and her falls and he really did not address it . She was advised several years ago to have back surgery locally which we did not feel was ideal with her sugars at the time being so out of control and now she is really more in control than then and it may be doable for her if felt  Needed though she would have to have cardiology clear her as well.    Mild CAD 07/29/2015   Overview:  Overview:  Cath march 2016 with 60% LAD with normal FFR   Overview:  Cath march 2016 with 60% LAD with normal FFR    Mixed hyperlipidemia 06/24/2015   Last Assessment & Plan:  Relevant Hx: Course: Daily Update: Today's Plan:update her lipids for her today and she is trying to watch her diet   Electronically signed by: Mayer Camel, NP 06/27/15 0813   Obstructive sleep apnea syndrome 06/24/2015   Onychomycosis due to dermatophyte 11/27/2015   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   Last Assessment & Plan:  Relevant Hx: Course: Daily Update: Today's Plan:she is doing well overall  Electronically signed by: Mayer Camel, NP 06/26/15 1531   Pancreatitis    Pre-ulcerative calluses 12/04/2015   Pre-ulcerative corn or callous 11/27/2015   Primary osteoarthritis involving multiple joints 07/06/2015   Last Assessment & Plan:  Relevant Hx: Course: Daily Update: Today's Plan:she has DDD of her back with some spinal  Stenosis that she has seen ortho locally for and he wanted to do surgery which she has avoided and I agree with, she is going to use the flexeril for this right now as she is having more muscle spasm than what she has had in the past from her attempt to walk down some very steep steps that she was not used to . She should use her walker for support through the weekend and avoid overhead strain and lifting and stay in touch if not better.  Electronically signed by: Mayer Camel, NP 07/06/15 [phone]   Sleep apnea    Stress incontinence of urine 04/17/2016   Syncope and collapse 10/13/2016   Thyroid disease    TIA (transient ischemic attack) 10/13/2016   Type 2 diabetes mellitus without complication, without long-term current use of insulin ([place]) 03/09/2012   Last Assessment & Plan:  She brings in readings of her sugars which show she has been doing much better overall with this at home and quite pleased with her   UTI (urinary tract  infection)    Vascular calcification 01/29/2016   Visual changes 01/29/2016   Overview:  Following with Dr. Julio Alm as well as had recent carotid doppler showing minimal plaque present and vertebral arteries normal , nothing signficant   Vitamin B12 deficiency 12/04/2015   Past Surgical History:  Procedure Laterality Date   ABDOMINAL  HYSTERECTOMY     CHOLECYSTECTOMY     EYE SURGERY     PANCREAS SURGERY     skin cancer lesion       No outpatient medications have been marked as taking for the 07/28/18 encounter (Appointment) with Richardo Priest, MD.     Allergies:   Nitrofurantoin and Statins   Social History   Tobacco Use   Smoking status: Never Smoker   Smokeless tobacco: Never Used  Substance Use Topics   Alcohol use: No   Drug use: No     Family Hx: The patient's family history includes Cancer in her sister; Diabetes in her mother; Hypertension in her father and mother.  ROS:   Please see the history of present illness.     All other systems reviewed and are negative.   Labs/Other Tests and Data Reviewed:    Recent Labs: No results found for requested labs within last 8760 hours.   Recent Lipid Panel Lab Results  Component Value Date/Time   CHOL 150 12/10/2016 12:00 AM   TRIG 90 12/10/2016 12:00 AM   HDL 69 12/10/2016 12:00 AM   CHOLHDL 2.2 12/10/2016 12:00 AM   LDLCALC 63 12/10/2016 12:00 AM    Wt Readings from Last 3 Encounters:  08/25/17 139 lb (63 kg)  07/07/17 138 lb 12.8 oz (63 kg)  12/10/16 132 lb (59.9 kg)     Objective:    Vital Signs:  There were no vitals taken for this visit.   Well nourished, well developed female in no acute distress. Her daughter was present she told me that she has no edema today or neck vein distention or pallor of the skin.  She does say that her extremities are cool to the touch.  The patient is not in distress and looks quite comfortable.  Blood pressure today is elevated which is not typical and there is a  great deal of stress trying to master the [place]:    1.  CAD, stable New York Heart Association class I she will continue current medical treatment including aspirin beta-blocker lipid-lowering therapy and will give her prescription for nitroglycerin if needed to avoid coming to emergency rooms during this pandemic.  At this time I do not think she requires an ischemia evaluation 2.  Heart failure with hypertension seems to be well compensated I told the daughter to give her an extra tablet of furosemide midday if needed for edema continue to current sodium restriction and baseline diuretic furosemide 40 mg twice daily.  Recent labs show normal renal function potassium for safety.  Blood pressure control at home is reasonable and I would not try to control her less than 1 38-2 30 systolic with previous severe symptomatic orthostatic hypotension and risk of injury 3.  Heart failure chronic diastolic New York Heart Association 2 stable see above 4.  Hyperlipidemia stable good response to and continue PCSK9.  She will need liver function full lipid profile with her next labs 5.  Left bundle branch block she will need an EKG with her next office visit and I will plan to see her in July or August.  COVID-19 Education: The signs and symptoms of COVID-19 were discussed with the patient and how to seek care for testing (follow up with PCP or arrange E-visit).  The importance of social distancing was discussed today.  Patient Risk:   After full review of this patient's clinical status, I feel that they are at least moderate  risk at this time.  Time:   Today, I have spent 30 minutes with the patient with telehealth technology discussing her CAD continuation of current meds and the needs for nitroglycerin, heart failure requiring extra diuretic as needed and close supervision attention by her daughter who was present hypertension which seems to be reasonably controlled at this time and I  would not try to achieve tight blood pressure control with her diabetes neuropathy and orthostatic hypotension and hyperlipidemia well controlled statin intolerant with her PCSK9.  This is a very complicated patient visit especially in view of her dementia and challenges of video technology..     Medication Adjustments/Labs and Tests Ordered: Current medicines are reviewed at length with the patient today.  Concerns regarding medicines are outlined above.  Tests Ordered: No orders of the defined types were placed in this encounter.  Medication Changes: No orders of the defined types were placed in this encounter.   Disposition:  Follow up July or August 2020  SignedShirlee More, MD  07/28/2018 11:20 AM    [employer]

## 2018-09-01 ENCOUNTER — Other Ambulatory Visit: Payer: Self-pay | Admitting: Cardiology

## 2018-10-18 ENCOUNTER — Ambulatory Visit: Payer: Medicare Other | Admitting: Cardiology

## 2018-10-19 ENCOUNTER — Ambulatory Visit: Payer: Medicare Other | Admitting: Cardiology

## 2018-11-02 NOTE — Progress Notes (Signed)
Cardiology Office Note:    Date:  11/03/2018   ID:  Wendy Leon, DOB July 01, 1932, MRN [id number]  PCP:  Wendy Mina., MD  Cardiologist:  Wendy More, MD    Referring MD: Wendy Mina., MD    ASSESSMENT:    1. Mild CAD   2. Chronic diastolic heart failure ([place])   3. Hypertensive heart disease with heart failure ([place])   4. Mixed hyperlipidemia   5. Left bundle branch block    PLAN:    In order of problems listed above:  1. She has known CAD and has a recent change in her anginal pattern she was referred for cardiac CTA class I and if severe flow-limiting stenosis despite her comorbidities and age I would advise revascularization percutaneously if appropriate continue current medical treatment with aspirin beta-blocker and PCSK9 as she is statin intolerant 2. Stable compensators no fluid overload New York Heart Association class I continue her current diuretic and check renal function potassium prior to CTA 3. Hypertension stable BP at target continue current treatment including ACE inhibitor with diabetes and CAD 4. Continue Praluent 5. Stable EKG pattern  Next appointment: 6 weeks  Medication Adjustments/Labs and Tests Ordered: Current medicines are reviewed at length with the patient today.  Concerns regarding medicines are outlined above.  No orders of the defined types were placed in this encounter.  No orders of the defined types were placed in this encounter.   Chief Complaint  Patient presents with   Follow-up   Coronary Artery Disease   Hypotension   Hypertension    History of Present Illness:    Wendy Leon is a 85 y.o. female with a hx of pancreatic cancer,mild CAD 2016 with 60% LAD with normal FFR , Dyslipidemia with statin intolerance, HTN and costochondral chest pain  last seen last seen 07/28/2018 virtual visit.  At that time CAD was stable New York Heart Association class I her hypertensive heart disease with heart failure was well compensators with  the family supervising her diuretic therapy and she had no recurrent symptomatic orthostatic hypotension.  Her hyperlipidemia was controlled with PCSK9 therapy.  03/04/2019 shows a potassium of 4.8 creatinine 1.05 TSH normal 2.50 cholesterol 156 HDL 62 LDL 81 and hemoglobin 12.1.  Compliance with diet, lifestyle and medications: yes  For several months she has been having episodes of anginal discomfort substernal pressure shortness of breath with and without palpitation.  And now occur several times a week generally occurs in the early morning hours often awakening her from sleep and she takes nitroglycerin with relief.  And also occurs during the day but is not exertional it is mild and does not cause her to pause and rest.  Because of this persistent pattern she seeks attention she has known mild CAD left bundle branch block on EKG and after review with patient and family she undergo cardiac CTA I suspect she will have severe flow-limiting stenosis and at that point would be appropriate for considering angiography and revascularization.  She has had no recent severe episodes in the last 2 weeks. Past Medical History:  Diagnosis Date   Abnormal gait 06/07/2009   Last Assessment & Plan:  She saw specialist who has given her orders for PT and she is wanting to have it setup for her at [facility] where she already is receiving care from at home   Acid reflux 02/20/2014   Aftercare following surgery 11/27/2015   Anemia of unknown etiology 10/18/2016   Anxiety 02/20/2014  Atrial fibrillation ([place])    CAD (coronary artery disease)    Cerebral ischemia 03/11/2016   Overview:  Microvascular ischemia seen on MRI 01/2015 with partial empty sella turcica neurology appt 03/12/2016 pending with [facility], also has [facility] with PT coming to the house for her gait abnormality and lumbar DDD, as well as local ortho (durranni)    CHF (congestive heart failure) ([place]) 06/24/2015   Last Assessment & Plan:   Relevant Hx: Course: Daily Update: Today's Plan:she has been doing well overall from her heart and no SOB  Electronically signed by: Wendy Camel, NP 06/26/15 1522   Chronic UTI 06/27/2015   Last Assessment & Plan:  She requested a UA be checked for her as she was having Leon frequency than before and it has been awhile since she was on oral abx for this, she is on low dose maintenance med to prevent and has done well with that.   Colon polyps    COPD (chronic obstructive pulmonary disease) ([place]) 03/09/2012   Last Assessment & Plan:  She feels she is stable from this discussed with her the allergies she has and she is going to discuss with Dr. Alcide Clever having allergy injections as well for this    Diabetes ([place]) 02/20/2014   Dizziness 02/07/2016   Overview:  Chronic and has been having falls, MRI showing chronic microvascular ischemia, as well as partial sella turcica pending neuro appt for her with [facility]   Dyslipidemia 07/29/2015   Edema 06/24/2015   Last Assessment & Plan:  Relevant Hx: Course: Daily Update: Today's Plan:this is stable for her overall and will follow for her  Electronically signed by: Wendy Camel, NP 06/27/15 0813   Elevated cholesterol    Essential hypertension 03/09/2012   Last Assessment & Plan:  On repeat this was stable for her and she does not check it at home much but when has been checked has been fine for her   Fistula 02/20/2014   Gastroesophageal reflux disease without esophagitis 02/20/2014   Last Assessment & Plan:  Relevant Hx: Course: Daily Update: Today's Plan:not on meds for this as she has had issues with her arms cramping and she is going to just drink buttermilk  Electronically signed by: Wendy Camel, NP 06/26/15 1530   Generalized abdominal pain 10/24/2016   Hallux valgus of left foot 12/04/2015   Hallux valgus of right foot 12/04/2015   High risk medication use 06/24/2015   Hx of valvular heart disease  01/29/2016   Hypertension 02/20/2014   Hypothyroidism (acquired) 03/09/2012   Last Assessment & Plan:  She was due for this to be updated and have advised her to get this drawn while here today   IBS (irritable bowel syndrome) 06/24/2015   Last Assessment & Plan:  Relevant Hx: Course: Daily Update: Today's Plan:this is stable for her  Electronically signed by: Wendy Camel, NP 06/26/15 1531   IBS (irritable bowel syndrome)    Idiopathic progressive polyneuropathy 06/07/2009   Left bundle branch block 06/24/2015   Leukocytosis 06/24/2015   Last Assessment & Plan:  Relevant Hx: Course: Daily Update: Today's Plan:this has been stable for her with FU through hematology who felt she was having benign swings in this and she chose to not have bone marrow biopsy done   Electronically signed by: Wendy Camel, NP 06/27/15 [phone]   Liposarcoma, 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 Ralene Cork [facility]), 8  x 6 x 3 cm. Local recurrence, re-excision 03/23/2007 [facility]iss Norse), 5 x 3 x 2 cm. Surveillance plan: baseline MRI April 2014  Last Assessment & Plan:  Relevant Hx: Course: Daily Update: Today's Plan:she is stable from this  Electronically signed by: Wendy Camel, NP 06/26/15 1533   Lumbar degenerative disc disease 11/06/2015   Last Assessment & Plan:  At this point she needs updated MRI of her lumbar spine and we discussed with her gait abnormality she is likely going to have to see specialist at [facility] for this, she has seen local neuro for this several years ago in terms of her neuropathy and her falls and he really did not address it . She was advised several years ago to have back surgery locally which we did not feel was ideal with her sugars at the time being so out of control and now she is really Leon in control than then and it may be doable for her if felt  Needed though she would have to have  cardiology clear her as well.   Mild CAD 07/29/2015   Overview:  Overview:  Cath march 2016 with 60% LAD with normal FFR   Overview:  Cath march 2016 with 60% LAD with normal FFR    Mixed hyperlipidemia 06/24/2015   Last Assessment & Plan:  Relevant Hx: Course: Daily Update: Today's Plan:update her lipids for her today and she is trying to watch her diet   Electronically signed by: Wendy Camel, NP 06/27/15 0813   Obstructive sleep apnea syndrome 06/24/2015   Onychomycosis due to dermatophyte 11/27/2015   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   Last Assessment & Plan:  Relevant Hx: Course: Daily Update: Today's Plan:she is doing well overall  Electronically signed by: Wendy Camel, NP 06/26/15 1531   Pancreatitis    Pre-ulcerative calluses 12/04/2015   Pre-ulcerative corn or callous 11/27/2015   Primary osteoarthritis involving multiple joints 07/06/2015   Last Assessment & Plan:  Relevant Hx: Course: Daily Update: Today's Plan:she has DDD of her back with some spinal  Stenosis that she has seen ortho locally for and he wanted to do surgery which she has avoided and I agree with, she is going to use the flexeril for this right now as she is having Leon muscle spasm than what she has had in the past from her attempt to walk down some very steep steps that she was not used to . She should use her walker for support through the weekend and avoid overhead strain and lifting and stay in touch if not better.  Electronically signed by: Wendy Camel, NP 07/06/15 [phone]   Sleep apnea    Stress incontinence of urine 04/17/2016   Syncope and collapse 10/13/2016   Thyroid disease    TIA (transient ischemic attack) 10/13/2016   Type 2 diabetes mellitus without complication, without long-term current use of insulin ([place]) 03/09/2012   Last Assessment & Plan:  She brings in readings of her sugars which show she has been doing much better overall with this at home and quite pleased  with her   UTI (urinary tract infection)    Vascular calcification 01/29/2016   Visual changes 01/29/2016   Overview:  Following with Dr. Julio Alm as well as had recent carotid doppler showing minimal plaque present and vertebral arteries normal , nothing signficant   Vitamin B12 deficiency 12/04/2015    Past Surgical History:  Procedure Laterality Date   ABDOMINAL HYSTERECTOMY     CHOLECYSTECTOMY  EYE SURGERY     PANCREAS SURGERY     skin cancer lesion      Current Medications: Current Meds  Medication Sig   ALPRAZolam (XANAX) 0.5 MG tablet Take 0.5 mg by mouth 3 (three) times daily as needed for anxiety.    aspirin EC 81 MG tablet Take 81 mg by mouth daily.   benazepril (LOTENSIN) 20 MG tablet Take 20 mg by mouth daily.   Blood Glucose Monitoring Suppl (ACCU-CHEK AVIVA PLUS) w/Device KIT See admin instructions.   donepezil (ARICEPT) 5 MG tablet Take 5 mg by mouth at bedtime.   furosemide (LASIX) 40 MG tablet Take 1 tablet (40 mg total) by mouth 2 (two) times daily.   gabapentin (NEURONTIN) 300 MG capsule Take 300 mg by mouth daily.   glimepiride (AMARYL) 1 MG tablet Take 0.5 tablets by mouth daily.   Insulin Glargine (LANTUS SOLOSTAR) 100 UNIT/ML Solostar Pen Inject 10 units at HS may increase to max 40 units per day based upon blood sugars   levothyroxine (SYNTHROID, LEVOTHROID) 75 MCG tablet Take 75 mcg by mouth daily.   metFORMIN (GLUCOPHAGE-XR) 500 MG 24 hr tablet Take 500 mg by mouth 2 (two) times daily.   metoprolol tartrate (LOPRESSOR) 50 MG tablet Take 50 mg by mouth 2 (two) times daily.   Mirabegron (MYRBETRIQ PO) Take 50 mg by mouth daily.   montelukast (SINGULAIR) 10 MG tablet Take 10 mg by mouth at bedtime.   nitroGLYCERIN (NITROSTAT) 0.4 MG SL tablet Place 1 tablet (0.4 mg total) under the tongue every 5 (five) minutes x 3 doses as needed for chest pain.   polyethylene glycol powder (GLYCOLAX/MIRALAX) powder TAKE 17 GRAMS BY MOUTH TWICE A DAY     PRALUENT 150 MG/ML SOAJ INJECT 1 SYRINGE INTO THE SKIN EVERY 14 DAYS   SYMBICORT 160-4.5 MCG/ACT inhaler Inhale 2 puffs into the lungs 2 (two) times daily.   trimethoprim (TRIMPEX) 100 MG tablet TAKE 2 TABLETS BY MOUTH EVERY DAY AT BEDTIME     Allergies:   Doxycycline, Nitrofurantoin, and Statins   Social History   Socioeconomic History   Marital status: Widowed    Spouse name: Not on file   Number of children: Not on file   Years of education: Not on file   Highest education level: Not on file  Occupational History   Not on file  Social Needs   Financial resource strain: Not on file   Food insecurity    Worry: Not on file    Inability: Not on file   Transportation needs    Medical: Not on file    Non-medical: Not on file  Tobacco Use   Smoking status: Never Smoker   Smokeless tobacco: Current User    Types: Snuff  Substance and Sexual Activity   Alcohol use: No   Drug use: No   Sexual activity: Not on file  Lifestyle   Physical activity    Days per week: Not on file    Minutes per session: Not on file   Stress: Not on file  Relationships   Social connections    Talks on phone: Not on file    Gets together: Not on file    Attends religious service: Not on file    Active member of club or organization: Not on file    Attends meetings of clubs or organizations: Not on file    Relationship status: Not on file  Other Topics Concern   Not on file  Social History  Narrative   Not on file     Family History: The patient's family history includes Cancer in her sister; Diabetes in her mother; Hypertension in her father and mother. ROS:   Please see the history of present illness.    All other systems reviewed and are negative.  EKGs/Labs/Other Studies Reviewed:    The following studies were reviewed today:  EKG:  EKG ordered today and personally reviewed.  The ekg ordered today demonstrates [facility] LBBB  Recent Lipid Panel    Component  Value Date/Time   CHOL 150 12/10/2016 0000   TRIG 90 12/10/2016 0000   HDL 69 12/10/2016 0000   CHOLHDL 2.2 12/10/2016 0000   LDLCALC 63 12/10/2016 0000    Physical Exam:    VS:  BP 132/68 (BP Location: Right Arm, Patient Position: Sitting, Cuff Size: Large)    Pulse (!) 58    Temp 99.3 F (37.4 C)    Ht _0  (1.626 m)    Wt 147 lb (66.7 kg)    SpO2 95%    BMI 25.23 kg/m     Wt Readings from Last 3 Encounters:  11/03/18 147 lb (66.7 kg)  07/28/18 143 lb 9.6 oz (65.1 kg)  08/25/17 139 lb (63 kg)     GEN:  Well nourished, well developed in no acute distress HEENT: Normal NECK: No JVD; No carotid bruits LYMPHATICS: No lymphadenopathy CARDIAC: RRR, no murmurs, rubs, gallops RESPIRATORY:  Clear to auscultation without rales, wheezing or rhonchi  ABDOMEN: Soft, non-tender, non-distended MUSCULOSKELETAL:  No edema; No deformity  SKIN: Warm and dry NEUROLOGIC:  Alert and oriented x 3 PSYCHIATRIC:  Normal affect    Signed, Wendy More, MD  11/03/2018 3:16 PM    [place]

## 2018-11-03 ENCOUNTER — Other Ambulatory Visit: Payer: Self-pay

## 2018-11-03 ENCOUNTER — Encounter: Payer: Self-pay | Admitting: Cardiology

## 2018-11-03 ENCOUNTER — Ambulatory Visit (INDEPENDENT_AMBULATORY_CARE_PROVIDER_SITE_OTHER): Payer: Medicare Other | Admitting: Cardiology

## 2018-11-03 VITALS — BP 132/68 | HR 58 | Temp 99.3°F | Ht 64.0 in | Wt 147.0 lb

## 2018-11-03 DIAGNOSIS — R079 Chest pain, unspecified: Secondary | ICD-10-CM

## 2018-11-03 DIAGNOSIS — I5032 Chronic diastolic (congestive) heart failure: Secondary | ICD-10-CM

## 2018-11-03 DIAGNOSIS — I110 Hypertensive heart disease with heart failure: Secondary | ICD-10-CM | POA: Diagnosis not present

## 2018-11-03 DIAGNOSIS — I2510 Atherosclerotic heart disease of native coronary artery without angina pectoris: Secondary | ICD-10-CM | POA: Diagnosis not present

## 2018-11-03 DIAGNOSIS — E782 Mixed hyperlipidemia: Secondary | ICD-10-CM | POA: Diagnosis not present

## 2018-11-03 DIAGNOSIS — I447 Left bundle-branch block, unspecified: Secondary | ICD-10-CM

## 2018-11-03 DIAGNOSIS — R072 Precordial pain: Secondary | ICD-10-CM

## 2018-11-03 DIAGNOSIS — Z01812 Encounter for preprocedural laboratory examination: Secondary | ICD-10-CM

## 2018-11-03 HISTORY — DX: Chest pain, unspecified: R07.9

## 2018-11-03 NOTE — Patient Instructions (Addendum)
Medication Instructions:  Your physician recommends that you continue on your current medications as directed. Please refer to the Current Medication list given to you today.  If you need a refill on your cardiac medications before your next appointment, please call your pharmacy.   Lab work: Your physician recommends that you return for lab work today: BMP, ProBNP.   If you have labs (blood work) drawn today and your tests are completely normal, you will receive your results only by: Marland Kitchen MyChart Message (if you have MyChart) OR . A paper copy in the mail If you have any lab test that is abnormal or we need to change your treatment, we will call you to review the results.  Testing/Procedures: You had an EKG today.   Your physician has requested that you have cardiac CT. Cardiac computed tomography (CT) is a painless test that uses an x-ray machine to take clear, detailed pictures of your heart. For further information please visit HugeFiesta.tn. Please follow instruction sheet as given.   Please arrive at the [facility] main entrance of [facility] at xx:xx AM (30-45 minutes prior to test start time)  [facility] [place], [place] [address] [phone]  Proceed to the [facility] Radiology Department (First Floor).  Please follow these instructions carefully (unless otherwise directed):  On the Night Before the Test: . Be sure to Drink plenty of water. . Do not consume any caffeinated/decaffeinated beverages or chocolate 12 hours prior to your test. . Do not take any antihistamines 12 hours prior to your test. . If you take Metformin do not take 24 hours prior to test.  On the Day of the Test: . Drink plenty of water. Do not drink any water within one hour of the test. . Do not eat any food 4 hours prior to the test. . You may take your regular medications prior to the test.except for the following: HOLD your dose of glimepiride the day  of testing until after testing is completed, only take HALF your normal dose of Lantus the night before testing. Do not take any insulin the morning of testing until after testing is completed.  . Take metoprolol (Lopressor) two hours prior to test. . HOLD Furosemide morning of the test.                   -If HR is less than 55 BPM- No Beta Blocker                -If HR is greater than 55 BPM and patient is greater than 33 yrs old Lopressor 50 mg x1.         After the Test: . Drink plenty of water. . After receiving IV contrast, you may experience a mild flushed feeling. This is normal. . On occasion, you may experience a mild rash up to 24 hours after the test. This is not dangerous. If this occurs, you can take Benadryl 25 mg and increase your fluid intake. . If you experience trouble breathing, this can be serious. If it is severe call 911 IMMEDIATELY. If it is mild, please call our office. . If you take any of these medications: Glipizide/Metformin, Avandament, Glucavance, please do not take 48 hours after completing test.   Follow-Up: At [facility], you and your health needs are our priority.  As part of our continuing mission to provide you with exceptional heart care, we have created designated Provider Care Teams.  These  Care Teams include your primary Cardiologist (physician) and Advanced Practice Providers (APPs -  Physician Assistants and Nurse Practitioners) who all work together to provide you with the care you need, when you need it. You will need a follow up appointment in 6 weeks.      Cardiac CT Angiogram  A cardiac CT angiogram is a procedure to look at the heart and the area around the heart. It may be done to help find the cause of chest pains or other symptoms of heart disease. During this procedure, a large X-ray machine, called a CT scanner, takes detailed pictures of the heart and the surrounding area after a dye (contrast material) has been injected into blood  vessels in the area. The procedure is also sometimes called a coronary CT angiogram, coronary artery scanning, or CTA. A cardiac CT angiogram allows the health care provider to see how well blood is flowing to and from the heart. The health care provider will be able to see if there are any problems, such as:  Blockage or narrowing of the coronary arteries in the heart.  Fluid around the heart.  Signs of weakness or disease in the muscles, valves, and tissues of the heart. Tell a health care provider about:  Any allergies you have. This is especially important if you have had a previous allergic reaction to contrast dye.  All medicines you are taking, including vitamins, herbs, eye drops, creams, and over-the-counter medicines.  Any blood disorders you have.  Any surgeries you have had.  Any medical conditions you have.  Whether you are pregnant or may be pregnant.  Any anxiety disorders, chronic pain, or other conditions you have that may increase your stress or prevent you from lying still. What are the risks? Generally, this is a safe procedure. However, problems may occur, including:  Bleeding.  Infection.  Allergic reactions to medicines or dyes.  Damage to other structures or organs.  Kidney damage from the dye or contrast that is used.  Increased risk of cancer from radiation exposure. This risk is low. Talk with your health care provider about: ? The risks and benefits of testing. ? How you can receive the lowest dose of radiation. What happens before the procedure?  Wear comfortable clothing and remove any jewelry, glasses, dentures, and hearing aids.  Follow instructions from your health care provider about eating and drinking. This may include: ? For 12 hours before the test - avoid caffeine. This includes tea, coffee, soda, energy drinks, and diet pills. Drink plenty of water or other fluids that do not have caffeine in them. Being well-hydrated can prevent  complications. ? For 4-6 hours before the test - stop eating and drinking. The contrast dye can cause nausea, but this is less likely if your stomach is empty.  Ask your health care provider about changing or stopping your regular medicines. This is especially important if you are taking diabetes medicines, blood thinners, or medicines to treat erectile dysfunction. What happens during the procedure?  Hair on your chest may need to be removed so that small sticky patches called electrodes can be placed on your chest. These will transmit information that helps to monitor your heart during the test.  An IV tube will be inserted into one of your veins.  You might be given a medicine to control your heart rate during the test. This will help to ensure that good images are obtained.  You will be asked to lie on an exam table.  This table will slide in and out of the CT machine during the procedure.  Contrast dye will be injected into the IV tube. You might feel warm, or you may get a metallic taste in your mouth.  You will be given a medicine (nitroglycerin) to relax (dilate) the arteries in your heart.  The table that you are lying on will move into the CT machine tunnel for the scan.  The person running the machine will give you instructions while the scans are being done. You may be asked to: ? Keep your arms above your head. ? Hold your breath. ? Stay very still, even if the table is moving.  When the scanning is complete, you will be moved out of the machine.  The IV tube will be removed. The procedure may vary among health care providers and hospitals. What happens after the procedure?  You might feel warm, or you may get a metallic taste in your mouth from the contrast dye.  You may have a headache from the nitroglycerin.  After the procedure, drink water or other fluids to wash (flush) the contrast material out of your body.  Contact a health care provider if you have any  symptoms of allergy to the contrast. These symptoms include: ? Shortness of breath. ? Rash or hives. ? A racing heartbeat.  Most people can return to their normal activities right after the procedure. Ask your health care provider what activities are safe for you.  It is up to you to get the results of your procedure. Ask your health care provider, or the department that is doing the procedure, when your results will be ready. Summary  A cardiac CT angiogram is a procedure to look at the heart and the area around the heart. It may be done to help find the cause of chest pains or other symptoms of heart disease.  During this procedure, a large X-ray machine, called a CT scanner, takes detailed pictures of the heart and the surrounding area after a dye (contrast material) has been injected into blood vessels in the area.  Ask your health care provider about changing or stopping your regular medicines before the procedure. This is especially important if you are taking diabetes medicines, blood thinners, or medicines to treat erectile dysfunction.  After the procedure, drink water or other fluids to wash (flush) the contrast material out of your body. This information is not intended to replace advice given to you by your health care provider. Make sure you discuss any questions you have with your health care provider. Document Released: 03/27/2008 Document Revised: 03/27/2017 Document Reviewed: 03/03/2016 Elsevier Patient Education  2020 [employer].

## 2018-11-04 ENCOUNTER — Telehealth: Payer: Self-pay

## 2018-11-04 LAB — BASIC METABOLIC PANEL
BUN/Creatinine Ratio: 14 (ref 12–28)
BUN: 17 mg/dL (ref 8–27)
CO2: 24 mmol/L (ref 20–29)
Calcium: 10.4 mg/dL — ABNORMAL HIGH (ref 8.7–10.3)
Chloride: 93 mmol/L — ABNORMAL LOW (ref 96–106)
Creatinine, Ser: 1.21 mg/dL — ABNORMAL HIGH (ref 0.57–1.00)
GFR calc Af Amer: 47 mL/min/{1.73_m2} — ABNORMAL LOW (ref >59)
GFR calc non Af Amer: 41 mL/min/{1.73_m2} — ABNORMAL LOW (ref >59)
Glucose: 130 mg/dL — ABNORMAL HIGH (ref 65–99)
Potassium: 4.6 mmol/L (ref 3.5–5.2)
Sodium: 134 mmol/L (ref 134–144)

## 2018-11-04 LAB — PRO B NATRIURETIC PEPTIDE: NT-Pro BNP: 352 pg/mL (ref 0–738)

## 2018-11-04 NOTE — Telephone Encounter (Signed)
Patient was called and notified of lab results.

## 2018-11-04 NOTE — Telephone Encounter (Signed)
-----   Message from Richardo Priest, MD sent at 11/04/2018  7:52 AM EDT ----- Normal or stable result  For cardiac CTA

## 2018-11-18 ENCOUNTER — Telehealth: Payer: Self-pay | Admitting: Cardiology

## 2018-11-18 NOTE — Telephone Encounter (Signed)
Phone call to [facility] to make him aware that there is a process to get Cardiac CTA scheduled. I reached out to [facility], and she will contact the patient to get it scheduled later on this evening.  Donnie is aware and verbalized understanding.

## 2018-11-18 NOTE — Telephone Encounter (Signed)
Patients on called and states patient has not heard about scheduling CT yet and she is concerned.  I explained that it took some time ro schedule due to the process and PA for testing and that [facility] would call her.  May want to follow up with him.Marland Kitchen

## 2018-11-18 NOTE — Addendum Note (Signed)
Addended by: Particia Nearing B on: 11/18/2018 03:37 PM   Modules accepted: Orders

## 2018-11-19 ENCOUNTER — Telehealth: Payer: Self-pay | Admitting: Pharmacist

## 2018-11-19 ENCOUNTER — Telehealth (HOSPITAL_COMMUNITY): Payer: Self-pay | Admitting: Emergency Medicine

## 2018-11-19 ENCOUNTER — Other Ambulatory Visit (HOSPITAL_COMMUNITY): Payer: Self-pay | Admitting: Emergency Medicine

## 2018-11-19 DIAGNOSIS — I5032 Chronic diastolic (congestive) heart failure: Secondary | ICD-10-CM

## 2018-11-19 DIAGNOSIS — N289 Disorder of kidney and ureter, unspecified: Secondary | ICD-10-CM

## 2018-11-19 NOTE — Telephone Encounter (Signed)
Reaching out to patient to offer assistance regarding upcoming cardiac imaging study; pt verbalizes understanding of appt date/time, parking situation and where to check in, pre-test NPO status and medications ordered, and verified current allergies; name and call back number provided for further questions should they arise Wendy Bond RN Navigator Cardiac Imaging [facility] [phone] office [phone] cell  Instructed to arrive at 1p to begin bicarb infusion for CCTA . Daughter to assist in [place] as she is her primary caregiver.

## 2018-11-19 NOTE — Telephone Encounter (Signed)
Spoke with patients daughter- confrimed patient is still taking Praluent. Informed her that PA has been extended until Apr 28 2019.

## 2018-11-22 ENCOUNTER — Ambulatory Visit (HOSPITAL_COMMUNITY)
Admission: RE | Admit: 2018-11-22 | Discharge: 2018-11-22 | Disposition: A | Discharge status: Home / Self Care | Payer: Medicare Other | Source: Ambulatory Visit | Attending: Cardiology | Admitting: Cardiology

## 2018-11-22 ENCOUNTER — Other Ambulatory Visit: Payer: Self-pay

## 2018-11-22 ENCOUNTER — Other Ambulatory Visit (HOSPITAL_COMMUNITY): Payer: Medicare Other

## 2018-11-22 DIAGNOSIS — I2510 Atherosclerotic heart disease of native coronary artery without angina pectoris: Secondary | ICD-10-CM | POA: Insufficient documentation

## 2018-11-22 DIAGNOSIS — R072 Precordial pain: Secondary | ICD-10-CM | POA: Insufficient documentation

## 2018-11-22 DIAGNOSIS — R079 Chest pain, unspecified: Secondary | ICD-10-CM | POA: Diagnosis not present

## 2018-11-22 DIAGNOSIS — N289 Disorder of kidney and ureter, unspecified: Secondary | ICD-10-CM

## 2018-11-22 LAB — BASIC METABOLIC PANEL
Anion gap: 12 (ref 5–15)
BUN: 13 mg/dL (ref 8–23)
CO2: 26 mmol/L (ref 22–32)
Calcium: 9.2 mg/dL (ref 8.9–10.3)
Chloride: 95 mmol/L — ABNORMAL LOW (ref 98–111)
Creatinine, Ser: 0.8 mg/dL (ref 0.44–1.00)
GFR calc Af Amer: >60 mL/min (ref >60)
GFR calc non Af Amer: >60 mL/min (ref >60)
Glucose, Bld: 160 mg/dL — ABNORMAL HIGH (ref 70–99)
Potassium: 3.6 mmol/L (ref 3.5–5.1)
Sodium: 133 mmol/L — ABNORMAL LOW (ref 135–145)

## 2018-11-22 MED ORDER — NITROGLYCERIN 0.4 MG SL SUBL
SUBLINGUAL_TABLET | SUBLINGUAL | Status: AC
  Filled 2018-11-22: qty 2

## 2018-11-22 MED ORDER — SODIUM BICARBONATE 8.4 % IV SOLN
INTRAVENOUS | Status: DC
  Filled 2018-11-22: qty 500

## 2018-11-22 MED ORDER — IOHEXOL 350 MG/ML SOLN
100.0000 mL | Freq: Once | INTRAVENOUS | Status: AC | PRN
  Administered 2018-11-22: 100 mL via INTRAVENOUS

## 2018-11-22 MED ORDER — SODIUM BICARBONATE BOLUS VIA INFUSION
INTRAVENOUS | Status: AC
  Administered 2018-11-22: 75 meq via INTRAVENOUS
  Filled 2018-11-22: qty 1

## 2018-11-22 MED ORDER — NITROGLYCERIN 0.4 MG SL SUBL
0.8000 mg | SUBLINGUAL_TABLET | Freq: Once | SUBLINGUAL | Status: AC
  Administered 2018-11-22: 0.8 mg via SUBLINGUAL

## 2018-11-22 NOTE — Progress Notes (Signed)
24 called and notified CT patient ready for cardiac CT

## 2018-11-23 DIAGNOSIS — I2510 Atherosclerotic heart disease of native coronary artery without angina pectoris: Secondary | ICD-10-CM | POA: Diagnosis not present

## 2018-11-29 ENCOUNTER — Ambulatory Visit: Payer: Medicare Other | Admitting: Cardiology

## 2018-12-14 MED FILL — Dextrose Inj 5%: INTRAVENOUS | Qty: 500 | Status: AC

## 2018-12-14 MED FILL — Sodium Bicarbonate IV Soln 8.4%: INTRAVENOUS | Qty: 75 | Status: AC

## 2018-12-21 NOTE — Progress Notes (Signed)
Cardiology Office Note:    Date:  12/22/2018   ID:  Wendy Leon, DOB 1932/12/05, MRN [id number]  PCP:  Raina Mina., MD  Cardiologist:  Shirlee More, MD    Referring MD: Raina Mina., MD    ASSESSMENT:    1. Mild CAD   2. Hypertensive heart disease with heart failure ([place])   3. Mixed hyperlipidemia   4. Chronic diastolic heart failure (HCC)    PLAN:    In order of problems listed above:  1. She has single-vessel CAD presently is not having angina and especially with her comorbidities and during COVID-19 and personal wish we will continue her current medical treatment reassess in 3 months.  I think she is on maximal medical therapy and I am hesitant to put her on additional vasodilators with her previous orthostatic hypotension and syncope. 2. Stable hypertension continue current treatment 3. Continue PCSK9 switch to Repatha as she tells me her pharmacy cannot obtain Praluent 4. Continue her diuretic but she needs get back to basics sodium restrict weigh daily continue her current furosemide   Next appointment: 3 months   Medication Adjustments/Labs and Tests Ordered: Current medicines are reviewed at length with the patient today.  Concerns regarding medicines are outlined above.  No orders of the defined types were placed in this encounter.  No orders of the defined types were placed in this encounter.   Chief Complaint  Patient presents with   Follow-up   Coronary Artery Disease    after cardiac CTA    History of Present Illness:    Wendy Leon is a 85 y.o. female with a hx of pancreatic cancer,mild CAD 2016 with 60% LAD with normal FFR , Dyslipidemia with statin intolerance, HTN and costochondral chest pain  last seen 11/03/2018. Compliance with diet, lifestyle and medications: Yes  She is sent home with her daughter and will return also also her cardiac CTA presently is not having angina she is quite frail she has stage III CKD and is very afraid of  being in the hospital during COVID-19 being isolated from her family.  We made a shared decision to continue medical treatment and reassess in 3 months as she is having frequent angina despite medical therapy would benefit from PCI and stent.  I am comfortable with the approach.  Her heart failure is compensated but she started adding salt to her diet I asked her to stop and to resume weighing daily.  She has intermittent edema none today no orthopnea syncope or TIA.  Cardiac CTA 11/22/2018: Calcium Score: 738 Agatston units.  Coronary Arteries: Right dominant with no anomalies  LM: Calcified plaque distal left main, no significant stenosis.  LAD system: Calcified plaque proximal and mid LAD, suspect no more than mild (<50%) stenosis.  Circumflex system: Calcified plaque in the proximal LCx, mild (<50%) stenosis.  RCA system: Mixed plaque in the proximal RCA with mild (<50%) stenosis. Calcified plaque mid RCA, suspect no more than 50% stenosis but difficult given blooming artifact.  IMPRESSION: 1. Coronary artery calcium score 738 Agatston units. This places the patient in the 82nd percentile for age and gender.  2.  Suspect nonobstructive CAD but will send for FFR to confirm  EXAM: CT FFR  FINDINGS: FFR 0.66 mid to distal LAD  FFR 0.89 mid RCA  FFR 0.87 mid LCx  IMPRESSION: This suggests the presence of a hemodynamically significant proximal LAD stenosis. Visually, there was calcified plaque throughout the proximal and mid LAD  that did not appear obstructive. Query low FFR as cumulative effect of extensive mild-moderate plaque but if symptomatic would consider catheterization for more close assessment. Past Medical History:  Diagnosis Date   Abnormal gait 06/07/2009   Last Assessment & Plan:  She saw specialist who has given her orders for PT and she is wanting to have it setup for her at [facility] where she already is receiving care from at home   Acid  reflux 02/20/2014   Aftercare following surgery 11/27/2015   Anemia of unknown etiology 10/18/2016   Anxiety 02/20/2014   Atrial fibrillation ([place])    CAD (coronary artery disease)    Cerebral ischemia 03/11/2016   Overview:  Microvascular ischemia seen on MRI 01/2015 with partial empty sella turcica neurology appt 03/12/2016 pending with [facility], also [facility] with PT coming to the house for her gait abnormality and lumbar DDD, as well as local ortho (durranni)    CHF (congestive heart failure) ([place]) 06/24/2015   Last Assessment & Plan:  Relevant Hx: Course: Daily Update: Today's Plan:she has been doing well overall from her heart and no SOB  Electronically signed by: Mayer Camel, NP 06/26/15 1522   Chronic UTI 06/27/2015   Last Assessment & Plan:  She requested a UA be checked for her as she was having more frequency than before and it has been awhile since she was on oral abx for this, she is on low dose maintenance med to prevent and has done well with that.   Colon polyps    COPD (chronic obstructive pulmonary disease) ([place]) 03/09/2012   Last Assessment & Plan:  She feels she is stable from this discussed with her the allergies she has and she is going to discuss with Dr. Alcide Clever having allergy injections as well for this    Diabetes ([place]) 02/20/2014   Dizziness 02/07/2016   Overview:  Chronic and has been having falls, MRI showing chronic microvascular ischemia, as well as partial sella turcica pending neuro appt for her with [facility]   Dyslipidemia 07/29/2015   Edema 06/24/2015   Last Assessment & Plan:  Relevant Hx: Course: Daily Update: Today's Plan:this is stable for her overall and will follow for her  Electronically signed by: Mayer Camel, NP 06/27/15 0813   Elevated cholesterol    Essential hypertension 03/09/2012   Last Assessment & Plan:  On repeat this was stable for her and she does not check it at home much but when has been checked has  been fine for her   Fistula 02/20/2014   Gastroesophageal reflux disease without esophagitis 02/20/2014   Last Assessment & Plan:  Relevant Hx: Course: Daily Update: Today's Plan:not on meds for this as she has had issues with her arms cramping and she is going to just drink buttermilk  Electronically signed by: Mayer Camel, NP 06/26/15 1530   Generalized abdominal pain 10/24/2016   Hallux valgus of left foot 12/04/2015   Hallux valgus of right foot 12/04/2015   High risk medication use 06/24/2015   Hx of valvular heart disease 01/29/2016   Hypertension 02/20/2014   Hypothyroidism (acquired) 03/09/2012   Last Assessment & Plan:  She was due for this to be updated and have advised her to get this drawn while here today   IBS (irritable bowel syndrome) 06/24/2015   Last Assessment & Plan:  Relevant Hx: Course: Daily Update: Today's Plan:this is stable for her  Electronically signed by: Mayer Camel, NP 06/26/15 1531  IBS (irritable bowel syndrome)    Idiopathic progressive polyneuropathy 06/07/2009   Left bundle branch block 06/24/2015   Leukocytosis 06/24/2015   Last Assessment & Plan:  Relevant Hx: Course: Daily Update: Today's Plan:this has been stable for her with FU through hematology who felt she was having benign swings in this and she chose to not have bone marrow biopsy done   Electronically signed by: Mayer Camel, NP 06/27/15 [phone]   Liposarcoma, 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 Ralene [facility]), 8 x 6 x 3 cm. Local recurrence, re-excision 03/23/2007 Candiss Norse), 5 x 3 x 2 cm. Surveillance plan: baseline MRI April 2014  Last Assessment & Plan:  Relevant Hx: Course: Daily Update: Today's Plan:she is stable from this  Electronically signed by: Mayer Camel, NP 06/26/15 1533   Lumbar degenerative disc disease 11/06/2015   Last Assessment & Plan:  At this point  she needs updated MRI of her lumbar spine and we discussed with her gait abnormality she is likely going to have to see specialist at [facility] for this, she has seen local neuro for this several years ago in terms of her neuropathy and her falls and he really did not address it . She was advised several years ago to have back surgery locally which we did not feel was ideal with her sugars at the time being so out of control and now she is really more in control than then and it may be doable for her if felt  Needed though she would have to have cardiology clear her as well.   Mild CAD 07/29/2015   Overview:  Overview:  Cath march 2016 with 60% LAD with normal FFR   Overview:  Cath march 2016 with 60% LAD with normal FFR    Mixed hyperlipidemia 06/24/2015   Last Assessment & Plan:  Relevant Hx: Course: Daily Update: Today's Plan:update her lipids for her today and she is trying to watch her diet   Electronically signed by: Mayer Camel, NP 06/27/15 0813   Obstructive sleep apnea syndrome 06/24/2015   Onychomycosis due to dermatophyte 11/27/2015   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   Last Assessment & Plan:  Relevant Hx: Course: Daily Update: Today's Plan:she is doing well overall  Electronically signed by: Mayer Camel, NP 06/26/15 1531   Pancreatitis    Pre-ulcerative calluses 12/04/2015   Pre-ulcerative corn or callous 11/27/2015   Primary osteoarthritis involving multiple joints 07/06/2015   Last Assessment & Plan:  Relevant Hx: Course: Daily Update: Today's Plan:she has DDD of her back with some spinal  Stenosis that she has seen ortho locally for and he wanted to do surgery which she has avoided and I agree with, she is going to use the flexeril for this right now as she is having more muscle spasm than what she has had in the past from her attempt to walk down some very steep steps that she was not used to . She should use her walker for support through the weekend and avoid  overhead strain and lifting and stay in touch if not better.  Electronically signed by: Mayer Camel, NP 07/06/15 [phone]   Sleep apnea    Stress incontinence of urine 04/17/2016   Syncope and collapse 10/13/2016   Thyroid disease    TIA (transient ischemic attack) 10/13/2016   Type 2 diabetes mellitus without complication, without long-term current use of insulin ([place]) 03/09/2012   Last Assessment &  Plan:  She brings in readings of her sugars which show she has been doing much better overall with this at home and quite pleased with her   UTI (urinary tract infection)    Vascular calcification 01/29/2016   Visual changes 01/29/2016   Overview:  Following with Dr. Julio Alm as well as had recent carotid doppler showing minimal plaque present and vertebral arteries normal , nothing signficant   Vitamin B12 deficiency 12/04/2015    Past Surgical History:  Procedure Laterality Date   ABDOMINAL HYSTERECTOMY     CHOLECYSTECTOMY     EYE SURGERY     PANCREAS SURGERY     skin cancer lesion      Current Medications: Current Meds  Medication Sig   ALPRAZolam (XANAX) 0.5 MG tablet Take 0.5 mg by mouth 3 (three) times daily as needed for anxiety.    aspirin EC 81 MG tablet Take 81 mg by mouth daily.   benazepril (LOTENSIN) 20 MG tablet Take 20 mg by mouth daily.   Blood Glucose Monitoring Suppl (ACCU-CHEK AVIVA PLUS) w/Device KIT See admin instructions.   donepezil (ARICEPT) 5 MG tablet Take 5 mg by mouth at bedtime.   fluticasone (FLONASE) 50 MCG/ACT nasal spray Place 1 spray into the nose 2 (two) times daily.   furosemide (LASIX) 40 MG tablet Take 1 tablet (40 mg total) by mouth 2 (two) times daily.   gabapentin (NEURONTIN) 300 MG capsule Take 300 mg by mouth daily.   glimepiride (AMARYL) 1 MG tablet Take 0.5 tablets by mouth daily.   Insulin Glargine (LANTUS SOLOSTAR) 100 UNIT/ML Solostar Pen Inject 10 units at HS may increase to max 40 units per day based upon  blood sugars   levothyroxine (SYNTHROID, LEVOTHROID) 75 MCG tablet Take 75 mcg by mouth daily.   metFORMIN (GLUCOPHAGE-XR) 500 MG 24 hr tablet Take 500 mg by mouth 2 (two) times daily.   metoprolol tartrate (LOPRESSOR) 50 MG tablet Take 50 mg by mouth 2 (two) times daily.   Mirabegron (MYRBETRIQ PO) Take 50 mg by mouth daily.   montelukast (SINGULAIR) 10 MG tablet Take 10 mg by mouth at bedtime.   nitroGLYCERIN (NITROSTAT) 0.4 MG SL tablet Place 1 tablet (0.4 mg total) under the tongue every 5 (five) minutes x 3 doses as needed for chest pain.   polyethylene glycol powder (GLYCOLAX/MIRALAX) powder TAKE 17 GRAMS BY MOUTH TWICE A DAY   PRALUENT 150 MG/ML SOAJ INJECT 1 SYRINGE INTO THE SKIN EVERY 14 DAYS   sulfamethoxazole-trimethoprim (BACTRIM DS) 800-160 MG tablet Take 1 tablet by mouth 2 (two) times daily as needed.   SYMBICORT 160-4.5 MCG/ACT inhaler Inhale 2 puffs into the lungs 2 (two) times daily.     Allergies:   Doxycycline, Nitrofurantoin, and Statins   Social History   Socioeconomic History   Marital status: Widowed    Spouse name: Not on file   Number of children: Not on file   Years of education: Not on file   Highest education level: Not on file  Occupational History   Not on file  Social Needs   Financial resource strain: Not on file   Food insecurity    Worry: Not on file    Inability: Not on file   Transportation needs    Medical: Not on file    Non-medical: Not on file  Tobacco Use   Smoking status: Never Smoker   Smokeless tobacco: Current User    Types: Snuff  Substance and Sexual Activity   Alcohol use:  No   Drug use: No   Sexual activity: Not on file  Lifestyle   Physical activity    Days per week: Not on file    Minutes per session: Not on file   Stress: Not on file  Relationships   Social connections    Talks on phone: Not on file    Gets together: Not on file    Attends religious service: Not on file    Active member  of club or organization: Not on file    Attends meetings of clubs or organizations: Not on file    Relationship status: Not on file  Other Topics Concern   Not on file  Social History Narrative   Not on file     Family History: The patient's family history includes Cancer in her sister; Diabetes in her mother; Hypertension in her father and mother. ROS:   Please see the history of present illness.    All other systems reviewed and are negative.  EKGs/Labs/Other Studies Reviewed:    The following studies were reviewed today  Recent Labs: 11/03/2018: NT-Pro BNP 352 11/22/2018: BUN 13; Creatinine, Ser 0.80; Potassium 3.6; Sodium 133  Recent Lipid Panel    Component Value Date/Time   CHOL 150 12/10/2016 0000   TRIG 90 12/10/2016 0000   HDL 69 12/10/2016 0000   CHOLHDL 2.2 12/10/2016 0000   LDLCALC 63 12/10/2016 0000    Physical Exam:    VS:  BP 124/68 (BP Location: Left Arm, Patient Position: Sitting, Cuff Size: Large)    Pulse 62    Ht '5\' 4"'$  (1.626 m)    Wt 153 lb 6.4 oz (69.6 kg)    SpO2 96%    BMI 26.33 kg/m     Wt Readings from Last 3 Encounters:  12/22/18 153 lb 6.4 oz (69.6 kg)  11/22/18 148 lb (67.1 kg)  11/03/18 147 lb (66.7 kg)     GEN: Very frail well nourished, well developed in no acute distress HEENT: Normal NECK: No JVD; No carotid bruits LYMPHATICS: No lymphadenopathy CARDIAC: RRR, no murmurs, rubs, gallops RESPIRATORY:  Clear to auscultation without rales, wheezing or rhonchi  ABDOMEN: Soft, non-tender, non-distended MUSCULOSKELETAL:  No edema; No deformity  SKIN: Warm and dry NEUROLOGIC:  Alert and oriented x 3 PSYCHIATRIC:  Normal affect    Signed, Shirlee More, MD  12/22/2018 3:14 PM    [facility]

## 2018-12-22 ENCOUNTER — Ambulatory Visit (INDEPENDENT_AMBULATORY_CARE_PROVIDER_SITE_OTHER): Payer: Medicare Other | Admitting: Cardiology

## 2018-12-22 ENCOUNTER — Encounter: Payer: Self-pay | Admitting: Cardiology

## 2018-12-22 ENCOUNTER — Other Ambulatory Visit: Payer: Self-pay

## 2018-12-22 VITALS — BP 124/68 | HR 62 | Ht 64.0 in | Wt 153.4 lb

## 2018-12-22 DIAGNOSIS — E782 Mixed hyperlipidemia: Secondary | ICD-10-CM | POA: Diagnosis not present

## 2018-12-22 DIAGNOSIS — I110 Hypertensive heart disease with heart failure: Secondary | ICD-10-CM

## 2018-12-22 DIAGNOSIS — I2510 Atherosclerotic heart disease of native coronary artery without angina pectoris: Secondary | ICD-10-CM

## 2018-12-22 DIAGNOSIS — I5032 Chronic diastolic (congestive) heart failure: Secondary | ICD-10-CM | POA: Diagnosis not present

## 2018-12-22 MED ORDER — REPATHA SURECLICK 140 MG/ML ~~LOC~~ SOAJ: 140.0000 mg | SUBCUTANEOUS | Status: DC

## 2018-12-22 NOTE — Patient Instructions (Signed)
Medication Instructions:  Your physician has recommended you make the following change in your medication:   STOP praluent  START repatha 140 mg: Inject 140 mg into the subcutaneous tissue every 14 days AFTER your lipid clinic visit   If you need a refill on your cardiac medications before your next appointment, please call your pharmacy.   Lab work: None  If you have labs (blood work) drawn today and your tests are completely normal, you will receive your results only by: Marland Kitchen MyChart Message (if you have MyChart) OR . A paper copy in the mail If you have any lab test that is abnormal or we need to change your treatment, we will call you to review the results.  Testing/Procedures: You have been referred to the lipid clinic for new start of repatha. You will be contacted to set up an appointment.   Follow-Up: At [facility], you and your health needs are our priority.  As part of our continuing mission to provide you with exceptional heart care, we have created designated Provider Care Teams.  These Care Teams include your primary Cardiologist (physician) and Advanced Practice Providers (APPs -  Physician Assistants and Nurse Practitioners) who all work together to provide you with the care you need, when you need it. You will need a follow up appointment in 3 months.      Evolocumab injection What is this medicine? EVOLOCUMAB (e voe LOK ue mab) is known as a PCSK9 inhibitor. It is used to lower the level of cholesterol in the blood. It may be used alone or in combination with other cholesterol-lowering drugs. This drug may also be used to reduce the risk of heart attack, stroke, and certain types of heart surgery in patients with heart disease. This medicine may be used for other purposes; ask your health care provider or pharmacist if you have questions. COMMON BRAND NAME(S): Repatha What should I tell my health care provider before I take this medicine? They need to know if you  have any of these conditions:  an unusual or allergic reaction to evolocumab, other medicines, foods, dyes, or preservatives  pregnant or trying to get pregnant  breast-feeding How should I use this medicine? This medicine is for injection under the skin. You will be taught how to prepare and give this medicine. Use exactly as directed. Take your medicine at regular intervals. Do not take your medicine more often than directed. It is important that you put your used needles and syringes in a special sharps container. Do not put them in a trash can. If you do not have a sharps container, call your pharmacist or health care provider to get one. Talk to your pediatrician regarding the use of this medicine in children. While this drug may be prescribed for children as young as 13 years for selected conditions, precautions do apply. Overdosage: If you think you have taken too much of this medicine contact a poison control center or emergency room at once. NOTE: This medicine is only for you. Do not share this medicine with others. What if I miss a dose? If you miss a dose, take it as soon as you can if there are more than 7 days until the next scheduled dose, or skip the missed dose and take the next dose according to your original schedule. Do not take double or extra doses. What may interact with this medicine? Interactions are not expected. This list may not describe all possible interactions. Give your health care  provider a list of all the medicines, herbs, non-prescription drugs, or dietary supplements you use. Also tell them if you smoke, drink alcohol, or use illegal drugs. Some items may interact with your medicine. What should I watch for while using this medicine? You may need blood work while you are taking this medicine. What side effects may I notice from receiving this medicine? Side effects that you should report to your doctor or health care professional as soon as  possible:  allergic reactions like skin rash, itching or hives, swelling of the face, lips, or tongue  signs and symptoms of high blood sugar such as dizziness; dry mouth; dry skin; fruity breath; nausea; stomach pain; increased hunger or thirst; increased urination  signs and symptoms of infection like fever or chills; cough; sore throat; pain or trouble passing urine Side effects that usually do not require medical attention (report to your doctor or health care professional if they continue or are bothersome):  diarrhea  nausea  muscle pain  pain, redness, or irritation at site where injected This list may not describe all possible side effects. Call your doctor for medical advice about side effects. You may report side effects to FDA at 1-800-FDA-1088. Where should I keep my medicine? Keep out of the reach of children. You will be instructed on how to store this medicine. Throw away any unused medicine after the expiration date on the label. NOTE: This sheet is a summary. It may not cover all possible information. If you have questions about this medicine, talk to your doctor, pharmacist, or health care provider.  2020 Elsevier/Gold Standard (2016-11-24 13:31:00)

## 2018-12-23 ENCOUNTER — Telehealth: Payer: Self-pay | Admitting: Cardiology

## 2018-12-23 MED ORDER — REPATHA SURECLICK 140 MG/ML ~~LOC~~ SOAJ: 140.0000 mg | SUBCUTANEOUS | 1 refills | Status: DC

## 2018-12-23 NOTE — Telephone Encounter (Signed)
Patient had an office visit yesterday, 12/22/2018, and was switched from praluent to repatha due to the pharmacy being unable to get praluent until 04/2019. She was also referred to the lipid clinic for the insurance approval process. Patient's daughter, [name], per DPR called and explained that the lipid clinic could not see the patient until 02/01/2019. She is concerned about waiting that long before starting the repatha injections, so a prescription has been sent to [facility] as requested. No further questions.

## 2018-12-23 NOTE — Telephone Encounter (Signed)
Wants to switch from praluent to rapatha

## 2018-12-23 NOTE — Addendum Note (Signed)
Addended by: Austin Miles on: 12/23/2018 10:16 AM   Modules accepted: Orders

## 2019-01-12 DIAGNOSIS — J302 Other seasonal allergic rhinitis: Secondary | ICD-10-CM

## 2019-01-12 HISTORY — DX: Other seasonal allergic rhinitis: J30.2

## 2019-02-01 ENCOUNTER — Ambulatory Visit: Payer: Medicare Other

## 2019-03-27 NOTE — Progress Notes (Signed)
Cardiology Office Note:    Date:  03/28/2019   ID:  Wendy Leon, DOB 1933-04-16, MRN [id number]  PCP:  Raina Mina., MD  Cardiologist:  Shirlee More, MD    Referring MD: Raina Mina., MD    ASSESSMENT:    1. Mild CAD   2. Hypertensive heart disease with heart failure ([place])   3. Mixed hyperlipidemia   4. Left bundle branch block   5. Chronic diastolic heart failure (HCC)    PLAN:    In order of problems listed above:  1. Stable continue medical therapy she has rare angina New York Heart Association class II with all of her comorbidities I would not advise elective revascularization.  The patient and her daughter are comfortable with this approach. 2. Stable blood pressure target and fortunately is having no orthostatic hypotension she is at the past we tried tight blood pressure control her heart failure is compensated New York Heart Association class I continue her loop diuretic check renal function potassium 3. Stable with PCSK9 therapy check lipid profile liver function    Next appointment: 6 months   Medication Adjustments/Labs and Tests Ordered: Current medicines are reviewed at length with the patient today.  Concerns regarding medicines are outlined above.  No orders of the defined types were placed in this encounter.  No orders of the defined types were placed in this encounter.   No chief complaint on file.   History of Present Illness:    Wendy Leon is a 85 y.o. female with a hx of  pancreatic cancer,mild CAD 2016 with 60% LAD with normal FFR , Dyslipidemia with statin intolerance, HTN and costochondral chest pain  last seen 12/22/2018.  At that time she is having no anginal discomfort and with her significant comorbidities after discussion and shared decision making was continued on medical treatment.  I reviewed her cardiac CTA with the patient and her daughter.  Compliance with diet, lifestyle and medications: Yes  In general she is done well she  had one episode of angina in the morning when she was getting dressed.  Tolerates her medications no orthostatic hypotension edema chest pain shortness of breath palpitation or syncope.  She had one episode of urticaria related to a cheese product with yellow dye and has had no recurrence.  She asked me if her medications are related to and I told her to be quite unusual not to have a recurrence on the medicines.  If she has frequent episodes she need to be seen by allergy.  Cardiac CTA 11/22/2018: Calcium Score: 738 Agatston units. Coronary Arteries: Right dominant with no anomalies LM: Calcified plaque distal left main, no significant stenosis. LAD system: Calcified plaque proximal and mid LAD, suspect no more than mild (<50%) stenosis. Circumflex system: Calcified plaque in the proximal LCx, mild (<50%) stenosis. RCA system: Mixed plaque in the proximal RCA with mild (<50%) stenosis. Calcified plaque mid RCA, suspect no more than 50% stenosis but difficult given blooming artifact.  IMPRESSION: 1. Coronary artery calcium score 738 Agatston units. This places the patient in the 82nd percentile for age and gender. 2. Suspect nonobstructive CAD but will send for FFR to confirm  EXAM: CT FFR FINDINGS: FFR 0.66 mid to distal LAD FFR 0.89 mid RCA FFR 0.87 mid LCx IMPRESSION: This suggests the presence of a hemodynamically significant proximal LAD stenosis. Visually, there was calcified plaque throughout the proximal and mid LAD that did not appear obstructive. Past Medical History:  Diagnosis Date  Abnormal gait 06/07/2009   Last Assessment & Plan:  She saw specialist who has given her orders for PT and she is wanting to have it setup for her at [facility] where she already is receiving care from at home   Acid reflux 02/20/2014   Aftercare following surgery 11/27/2015   Anemia of unknown etiology 10/18/2016   Anxiety 02/20/2014   Atrial fibrillation ([place])    CAD (coronary  artery disease)    Cerebral ischemia 03/11/2016   Overview:  Microvascular ischemia seen on MRI 01/2015 with partial empty sella turcica neurology appt 03/12/2016 pending with [facility], also has [facility] with PT coming to the house for her gait abnormality and lumbar DDD, as well as local ortho (durranni)    CHF (congestive heart failure) ([place]) 06/24/2015   Last Assessment & Plan:  Relevant Hx: Course: Daily Update: Today's Plan:she has been doing well overall from her heart and no SOB  Electronically signed by: Mayer Camel, NP 06/26/15 1522   Chronic UTI 06/27/2015   Last Assessment & Plan:  She requested a UA be checked for her as she was having more frequency than before and it has been awhile since she was on oral abx for this, she is on low dose maintenance med to prevent and has done well with that.   Colon polyps    COPD (chronic obstructive pulmonary disease) ([place]) 03/09/2012   Last Assessment & Plan:  She feels she is stable from this discussed with her the allergies she has and she is going to discuss with Dr. Alcide Clever having allergy injections as well for this    Diabetes ([place]) 02/20/2014   Dizziness 02/07/2016   Overview:  Chronic and has been having falls, MRI showing chronic microvascular ischemia, as well as partial sella turcica pending neuro appt for her with [facility]   Dyslipidemia 07/29/2015   Edema 06/24/2015   Last Assessment & Plan:  Relevant Hx: Course: Daily Update: Today's Plan:this is stable for her overall and will follow for her  Electronically signed by: Mayer Camel, NP 06/27/15 0813   Elevated cholesterol    Essential hypertension 03/09/2012   Last Assessment & Plan:  On repeat this was stable for her and she does not check it at home much but when has been checked has been fine for her   Fistula 02/20/2014   Gastroesophageal reflux disease without esophagitis 02/20/2014   Last Assessment & Plan:  Relevant Hx: Course: Daily Update:  Today's Plan:not on meds for this as she has had issues with her arms cramping and she is going to just drink buttermilk  Electronically signed by: Mayer Camel, NP 06/26/15 1530   Generalized abdominal pain 10/24/2016   Hallux valgus of left foot 12/04/2015   Hallux valgus of right foot 12/04/2015   High risk medication use 06/24/2015   Hx of valvular heart disease 01/29/2016   Hypertension 02/20/2014   Hypothyroidism (acquired) 03/09/2012   Last Assessment & Plan:  She was due for this to be updated and have advised her to get this drawn while here today   IBS (irritable bowel syndrome) 06/24/2015   Last Assessment & Plan:  Relevant Hx: Course: Daily Update: Today's Plan:this is stable for her  Electronically signed by: Mayer Camel, NP 06/26/15 1531   IBS (irritable bowel syndrome)    Idiopathic progressive polyneuropathy 06/07/2009   Left bundle branch block 06/24/2015   Leukocytosis 06/24/2015   Last Assessment & Plan:  Relevant Hx:  Course: Daily Update: Today's Plan:this has been stable for her with FU through hematology who felt she was having benign swings in this and she chose to not have bone marrow biopsy done   Electronically signed by: Mayer Camel, NP 06/27/15 [phone]   Liposarcoma, 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 Ralene [facility]), 8 x 6 x 3 cm. Local recurrence, re-excision 03/23/2007 Candiss Norse), 5 x 3 x 2 cm. Surveillance plan: baseline MRI April 2014  Last Assessment & Plan:  Relevant Hx: Course: Daily Update: Today's Plan:she is stable from this  Electronically signed by: Mayer Camel, NP 06/26/15 1533   Lumbar degenerative disc disease 11/06/2015   Last Assessment & Plan:  At this point she needs updated MRI of her lumbar spine and we discussed with her gait abnormality she is likely going to have to see specialist at [facility] for this, she has seen local  neuro for this several years ago in terms of her neuropathy and her falls and he really did not address it . She was advised several years ago to have back surgery locally which we did not feel was ideal with her sugars at the time being so out of control and now she is really more in control than then and it may be doable for her if felt  Needed though she would have to have cardiology clear her as well.   Mild CAD 07/29/2015   Overview:  Overview:  Cath march 2016 with 60% LAD with normal FFR   Overview:  Cath march 2016 with 60% LAD with normal FFR    Mixed hyperlipidemia 06/24/2015   Last Assessment & Plan:  Relevant Hx: Course: Daily Update: Today's Plan:update her lipids for her today and she is trying to watch her diet   Electronically signed by: Mayer Camel, NP 06/27/15 0813   Obstructive sleep apnea syndrome 06/24/2015   Onychomycosis due to dermatophyte 11/27/2015   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   Last Assessment & Plan:  Relevant Hx: Course: Daily Update: Today's Plan:she is doing well overall  Electronically signed by: Mayer Camel, NP 06/26/15 1531   Pancreatitis    Pre-ulcerative calluses 12/04/2015   Pre-ulcerative corn or callous 11/27/2015   Primary osteoarthritis involving multiple joints 07/06/2015   Last Assessment & Plan:  Relevant Hx: Course: Daily Update: Today's Plan:she has DDD of her back with some spinal  Stenosis that she has seen ortho locally for and he wanted to do surgery which she has avoided and I agree with, she is going to use the flexeril for this right now as she is having more muscle spasm than what she has had in the past from her attempt to walk down some very steep steps that she was not used to . She should use her walker for support through the weekend and avoid overhead strain and lifting and stay in touch if not better.  Electronically signed by: Mayer Camel, NP 07/06/15 [phone]   Sleep apnea    Stress incontinence of  urine 04/17/2016   Syncope and collapse 10/13/2016   Thyroid disease    TIA (transient ischemic attack) 10/13/2016   Type 2 diabetes mellitus without complication, without long-term current use of insulin ([place]) 03/09/2012   Last Assessment & Plan:  She brings in readings of her sugars which show she has been doing much better overall with this at home and quite pleased with her   UTI (urinary  tract infection)    Vascular calcification 01/29/2016   Visual changes 01/29/2016   Overview:  Following with Dr. Julio Alm as well as had recent carotid doppler showing minimal plaque present and vertebral arteries normal , nothing signficant   Vitamin B12 deficiency 12/04/2015    Past Surgical History:  Procedure Laterality Date   ABDOMINAL HYSTERECTOMY     CHOLECYSTECTOMY     EYE SURGERY     PANCREAS SURGERY     skin cancer lesion      Current Medications: Current Meds  Medication Sig   ALPRAZolam (XANAX) 0.5 MG tablet Take 0.5 mg by mouth 3 (three) times daily as needed for anxiety.    amLODipine (NORVASC) 5 MG tablet Take 2.5 mg by mouth daily.   aspirin EC 81 MG tablet Take 81 mg by mouth daily.   benazepril (LOTENSIN) 20 MG tablet Take 20 mg by mouth daily.   Blood Glucose Monitoring Suppl (ACCU-CHEK AVIVA PLUS) w/Device KIT See admin instructions.   cyanocobalamin (,VITAMIN B-12,) 1000 MCG/ML injection Inject into the muscle every 30 (thirty) days.   donepezil (ARICEPT) 5 MG tablet Take 5 mg by mouth at bedtime.   Evolocumab (REPATHA SURECLICK) 945 MG/ML SOAJ Inject 140 mg into the skin every 14 (fourteen) days.   fluticasone (FLONASE) 50 MCG/ACT nasal spray Place 1 spray into the nose 2 (two) times daily.   furosemide (LASIX) 40 MG tablet Take 1 tablet (40 mg total) by mouth 2 (two) times daily.   gabapentin (NEURONTIN) 300 MG capsule Take 300 mg by mouth daily.   glimepiride (AMARYL) 1 MG tablet Take 0.5 tablets by mouth daily.   Insulin Glargine (LANTUS SOLOSTAR)  100 UNIT/ML Solostar Pen Inject 10 units at HS may increase to max 40 units per day based upon blood sugars   levothyroxine (SYNTHROID, LEVOTHROID) 75 MCG tablet Take 75 mcg by mouth daily.   metFORMIN (GLUCOPHAGE-XR) 500 MG 24 hr tablet Take 500 mg by mouth 2 (two) times daily.   metoprolol tartrate (LOPRESSOR) 50 MG tablet Take 50 mg by mouth 2 (two) times daily.   Mirabegron (MYRBETRIQ PO) Take 50 mg by mouth daily.   montelukast (SINGULAIR) 10 MG tablet Take 10 mg by mouth at bedtime.   nitroGLYCERIN (NITROSTAT) 0.4 MG SL tablet Place 1 tablet (0.4 mg total) under the tongue every 5 (five) minutes x 3 doses as needed for chest pain.   polyethylene glycol powder (GLYCOLAX/MIRALAX) powder TAKE 17 GRAMS BY MOUTH TWICE A DAY   sulfamethoxazole-trimethoprim (BACTRIM DS) 800-160 MG tablet Take 1 tablet by mouth 2 (two) times daily as needed.   SYMBICORT 160-4.5 MCG/ACT inhaler Inhale 2 puffs into the lungs 2 (two) times daily.     Allergies:   Doxycycline, Nitrofurantoin, and Statins   Social History   Socioeconomic History   Marital status: Widowed    Spouse name: Not on file   Number of children: Not on file   Years of education: Not on file   Highest education level: Not on file  Occupational History   Not on file  Social Needs   Financial resource strain: Not on file   Food insecurity    Worry: Not on file    Inability: Not on file   Transportation needs    Medical: Not on file    Non-medical: Not on file  Tobacco Use   Smoking status: Never Smoker   Smokeless tobacco: Current User    Types: Snuff  Substance and Sexual Activity   Alcohol  use: No   Drug use: No   Sexual activity: Not on file  Lifestyle   Physical activity    Days per week: Not on file    Minutes per session: Not on file   Stress: Not on file  Relationships   Social connections    Talks on phone: Not on file    Gets together: Not on file    Attends religious service: Not on  file    Active member of club or organization: Not on file    Attends meetings of clubs or organizations: Not on file    Relationship status: Not on file  Other Topics Concern   Not on file  Social History Narrative   Not on file     Family History: The patient's family history includes Cancer in her sister; Diabetes in her mother; Hypertension in her father and mother. ROS:   Please see the history of present illness.    All other systems reviewed and are negative.  EKGs/Labs/Other Studies Reviewed:    The following studies were reviewed today:   Recent Labs: 11/03/2018: NT-Pro BNP 352 11/22/2018: BUN 13; Creatinine, Ser 0.80; Potassium 3.6; Sodium 133  Recent Lipid Panel    Component Value Date/Time   CHOL 150 12/10/2016 0000   TRIG 90 12/10/2016 0000   HDL 69 12/10/2016 0000   CHOLHDL 2.2 12/10/2016 0000   LDLCALC 63 12/10/2016 0000    Physical Exam:    VS:  BP (!) 148/72 (BP Location: Right Arm, Patient Position: Sitting, Cuff Size: Normal)    Pulse (!) 59    Ht '5\' 4"'$  (1.626 m)    Wt 151 lb (68.5 kg)    SpO2 96%    BMI 25.92 kg/m     Wt Readings from Last 3 Encounters:  03/28/19 151 lb (68.5 kg)  12/22/18 153 lb 6.4 oz (69.6 kg)  11/22/18 148 lb (67.1 kg)     GEN:  Well nourished, well developed in no acute distress HEENT: Normal NECK: No JVD; No carotid bruits LYMPHATICS: No lymphadenopathy CARDIAC: S2 paradoxical RRR, no murmurs, rubs, gallops RESPIRATORY:  Clear to auscultation without rales, wheezing or rhonchi  ABDOMEN: Soft, non-tender, non-distended MUSCULOSKELETAL:  No edema; No deformity  SKIN: Warm and dry NEUROLOGIC:  Alert and oriented x 3 PSYCHIATRIC:  Normal affect    Signed, Shirlee More, MD  03/28/2019 2:49 PM    [place]

## 2019-03-28 ENCOUNTER — Encounter: Payer: Self-pay | Admitting: Cardiology

## 2019-03-28 ENCOUNTER — Ambulatory Visit (INDEPENDENT_AMBULATORY_CARE_PROVIDER_SITE_OTHER): Payer: Medicare Other | Admitting: Cardiology

## 2019-03-28 ENCOUNTER — Other Ambulatory Visit: Payer: Self-pay

## 2019-03-28 VITALS — BP 148/72 | HR 59 | Ht 64.0 in | Wt 151.0 lb

## 2019-03-28 DIAGNOSIS — I447 Left bundle-branch block, unspecified: Secondary | ICD-10-CM | POA: Diagnosis not present

## 2019-03-28 DIAGNOSIS — I25118 Atherosclerotic heart disease of native coronary artery with other forms of angina pectoris: Secondary | ICD-10-CM

## 2019-03-28 DIAGNOSIS — I110 Hypertensive heart disease with heart failure: Secondary | ICD-10-CM | POA: Diagnosis not present

## 2019-03-28 DIAGNOSIS — E782 Mixed hyperlipidemia: Secondary | ICD-10-CM | POA: Diagnosis not present

## 2019-03-28 DIAGNOSIS — I5032 Chronic diastolic (congestive) heart failure: Secondary | ICD-10-CM

## 2019-03-28 MED ORDER — REPATHA SURECLICK 140 MG/ML ~~LOC~~ SOAJ: 140.0000 mg | SUBCUTANEOUS | 1 refills | Status: DC

## 2019-03-28 NOTE — Addendum Note (Signed)
Addended by: Austin Miles on: 03/28/2019 03:14 PM   Modules accepted: Orders

## 2019-03-28 NOTE — Patient Instructions (Signed)
Medication Instructions:  Your physician recommends that you continue on your current medications as directed. Please refer to the Current Medication list given to you today.  *If you need a refill on your cardiac medications before your next appointment, please call your pharmacy*  Lab Work: Your physician recommends that you return for lab work today: CMP, lipid panel, ProBNP.   If you have labs (blood work) drawn today and your tests are completely normal, you will receive your results only by: Marland Kitchen MyChart Message (if you have MyChart) OR . A paper copy in the mail If you have any lab test that is abnormal or we need to change your treatment, we will call you to review the results.  Testing/Procedures: None  Follow-Up: At [facility], you and your health needs are our priority.  As part of our continuing mission to provide you with exceptional heart care, we have created designated Provider Care Teams.  These Care Teams include your primary Cardiologist (physician) and Advanced Practice Providers (APPs -  Physician Assistants and Nurse Practitioners) who all work together to provide you with the care you need, when you need it.  Your next appointment:   6 month(s)  The format for your next appointment:   In Person  Provider:   Shirlee More, MD

## 2019-03-28 NOTE — Addendum Note (Signed)
Addended by: Austin Miles on: 03/28/2019 03:08 PM   Modules accepted: Orders

## 2019-03-29 LAB — PRO B NATRIURETIC PEPTIDE: NT-Pro BNP: 343 pg/mL (ref 0–738)

## 2019-03-29 LAB — COMPREHENSIVE METABOLIC PANEL
ALT: 14 IU/L (ref 0–32)
AST: 15 IU/L (ref 0–40)
Albumin/Globulin Ratio: 1.7 (ref 1.2–2.2)
Albumin: 4.6 g/dL (ref 3.6–4.6)
Alkaline Phosphatase: 66 IU/L (ref 39–117)
BUN/Creatinine Ratio: 21 (ref 12–28)
BUN: 25 mg/dL (ref 8–27)
Bilirubin Total: 0.3 mg/dL (ref 0.0–1.2)
CO2: 23 mmol/L (ref 20–29)
Calcium: 10.1 mg/dL (ref 8.7–10.3)
Chloride: 91 mmol/L — ABNORMAL LOW (ref 96–106)
Creatinine, Ser: 1.2 mg/dL — ABNORMAL HIGH (ref 0.57–1.00)
GFR calc Af Amer: 48 mL/min/{1.73_m2} — ABNORMAL LOW (ref >59)
GFR calc non Af Amer: 42 mL/min/{1.73_m2} — ABNORMAL LOW (ref >59)
Globulin, Total: 2.7 g/dL (ref 1.5–4.5)
Glucose: 145 mg/dL — ABNORMAL HIGH (ref 65–99)
Potassium: 4.1 mmol/L (ref 3.5–5.2)
Sodium: 137 mmol/L (ref 134–144)
Total Protein: 7.3 g/dL (ref 6.0–8.5)

## 2019-03-29 LAB — LIPID PANEL
Chol/HDL Ratio: 2.4 ratio (ref 0.0–4.4)
Cholesterol, Total: 208 mg/dL — ABNORMAL HIGH (ref 100–199)
HDL: 86 mg/dL (ref >39)
LDL Chol Calc (NIH): 91 mg/dL (ref 0–99)
Triglycerides: 190 mg/dL — ABNORMAL HIGH (ref 0–149)
VLDL Cholesterol Cal: 31 mg/dL (ref 5–40)

## 2019-04-07 ENCOUNTER — Telehealth: Payer: Self-pay | Admitting: Cardiology

## 2019-04-07 MED ORDER — PRALUENT 150 MG/ML ~~LOC~~ SOAJ: 150.0000 mg | SUBCUTANEOUS | 1 refills | Status: DC

## 2019-04-07 NOTE — Telephone Encounter (Signed)
Patient on Repatha, it is not working, please advise, new insurance will cover previous medicine

## 2019-04-07 NOTE — Telephone Encounter (Signed)
Patient's daughter, [name], per DPR called with concern that repatha isn't working for the patient as effectively as praluent did in the past due to recent lab results from 03/28/2019. She is requesting a printed prescription for praluent to see if she can get this medication from another pharmacy since [facility] cannot fill this medication. Please advise. Thanks!

## 2019-04-07 NOTE — Telephone Encounter (Signed)
Switch to Praluent

## 2019-04-07 NOTE — Telephone Encounter (Signed)
Wendy Leon advised that patient can switch back to praluent and discontinue repatha. Informed her a printed prescription is available for pick up in the [place] office at the front desk as requested. Wendy Leon verbalized understanding. No further questions.

## 2019-04-25 ENCOUNTER — Other Ambulatory Visit: Payer: Self-pay | Admitting: Cardiology

## 2019-05-30 ENCOUNTER — Telehealth: Payer: Self-pay | Admitting: Emergency Medicine

## 2019-05-30 NOTE — Telephone Encounter (Signed)
Called and spoke to patient's daughter per patient approval. She reports the patient is still trying to get praluent she has a couple pharmacies she is going to check with if they don't have it she will call us back and let us know.

## 2019-06-02 NOTE — Telephone Encounter (Signed)
Called patient daughter [name] who says that Wendy Leon was able to give them the Praluent. No further questions

## 2019-06-02 NOTE — Telephone Encounter (Signed)
Patient's daughter, [name], is calling to follow up in regards to Praluent medication. She states that she was able to obtain Praluent medication and the patient will not need prior authorization for the Repatha medication. Please call to confirm.

## 2019-06-15 DIAGNOSIS — Z87440 Personal history of urinary (tract) infections: Secondary | ICD-10-CM | POA: Insufficient documentation

## 2019-06-15 HISTORY — DX: Personal history of urinary (tract) infections: Z87.440

## 2019-06-22 ENCOUNTER — Telehealth: Payer: Self-pay | Admitting: Cardiology

## 2019-06-22 NOTE — Telephone Encounter (Signed)
New Message    Wendy Leon is calling and says the pt is needing prior authorization for medication Alirocumab (PRALUENT) 150 MG/ML SOAJ   Please call

## 2019-06-22 NOTE — Telephone Encounter (Signed)
Called and spoke w/pt stated that they do not need a new pa they are good till June. Pt voiced understanding.

## 2019-07-29 ENCOUNTER — Other Ambulatory Visit: Payer: Self-pay

## 2019-07-31 NOTE — Progress Notes (Signed)
Cardiology Office Note:    Date:  08/01/2019   ID:  Wendy Leon, DOB 1933/01/16, MRN [id number]  PCP:  Raina Mina., MD  Cardiologist:  Shirlee More, MD    Referring MD: Raina Mina., MD    ASSESSMENT:    1. Coronary artery disease of native artery of native heart with stable angina pectoris ([place])   2. Hypertensive heart disease with heart failure ([place])   3. Mixed hyperlipidemia   4. Left bundle branch block   5. Chest pain of uncertain etiology    PLAN:    In order of problems listed above:  1. She is having atypical symptoms nighttime occurs sporadically and is opted to intensify medical therapy adding oral mononitrates aspirin calcium channel blocker beta-blocker and lipid-lowering treatment.  With left bundle branch block we will check a high-sensitivity troponin if normal reassess in the office in 6 weeks.  Her comorbidities and dementia and frequent falls makes her a poor candidate for elective interventions 2. Stable continue current treatment 3. Stable continue PCSK9 inhibitor lipids are at target 4. Stable EKG pattern   Next appointment: 6 weeks   Medication Adjustments/Labs and Tests Ordered: Current medicines are reviewed at length with the patient today.  Concerns regarding medicines are outlined above.  Orders Placed This Encounter  Procedures  . Troponin T  . EKG 12-Lead   Meds ordered this encounter  Medications  . isosorbide mononitrate (IMDUR) 30 MG 24 hr tablet    Sig: Take 1 tablet (30 mg total) by mouth daily.    Dispense:  90 tablet    Refill:  3    Chief Complaint  Patient presents with  . Follow-up  . Coronary Artery Disease    History of Present Illness:    Wendy Leon is a 86 y.o. female with a hx of  pancreatic cancer,mild CAD 2016 with 60% LAD with normal FFR , Dyslipidemia with statin intolerance, HTN and costochondral chest pain  last seen 03/28/2019.  With her comorbidities and after shared decision making the patient opted  for medical therapy and CAD.  When seen with her PCP 07/25/2019 she was having frequent episodes of chest discomfort at night relieved with nitroglycerin.  She is statin intolerant uses PCSK9 inhibitor and her LDL is at target.  She is on aspirin lipid-lowering treatment and 2 antianginal medications beta-blocker and calcium channel blocker. Compliance with diet, lifestyle and medications: Yes Last week she had several episodes of waking in the middle the night with localized left sternal sharp and burning chest pain a resolve spontaneously or with nitroglycerin and has no exertional symptoms.  She has had frequent falls at home but she no chest wall trauma.  Patient and family wonder if it is related to her GI tract and she has had extensive surgery for pancreatic cancer.  I reviewed with him the option of ongoing medical therapy adding oral nitrates at bedtime referral to coronary angiography he decided to take a careful cautious conservative approach we will check a high-sensitivity troponin for acute coronary syndrome and if normal intensify medical therapy and reassess in the office in 6 weeks.  She is having no exertional chest pain edema shortness of breath palpitation or syncope.  Cardiac CTA 11/22/2018: Calcium Score: 738 Agatston units. Coronary Arteries: Right dominant with no anomalies LM: Calcified plaque distal left main, no significant stenosis. LAD system: Calcified plaque proximal and mid LAD, suspect no more than mild (<50%) stenosis. Circumflex system: Calcified plaque in  the proximal LCx, mild (<50%) stenosis. RCA system: Mixed plaque in the proximal RCA with mild (<50%) stenosis. Calcified plaque mid RCA, suspect no more than 50% stenosis but difficult given blooming artifact.  IMPRESSION: 1. Coronary artery calcium score 738 Agatston units. This places the patient in the 82nd percentile for age and gender. 2. Suspect nonobstructive CAD but will send for FFR to  confirm  EXAM: CT FFR FINDINGS: FFR 0.66 mid to distal LAD FFR 0.89 mid RCA FFR 0.87 mid LCx IMPRESSION: This suggests the presence of a hemodynamically significant proximal LAD stenosis. Visually, there was calcified plaque throughout the proximal and mid LAD that did not appear obstructive.  Past Medical History:  Diagnosis Date  . Abnormal gait 06/07/2009   Last Assessment & Plan:  She saw specialist who has given her orders for PT and she is wanting to have it setup for her at [facility] where she already is receiving care from at home  . Acid reflux 02/20/2014  . Aftercare following surgery 11/27/2015  . Anemia of unknown etiology 10/18/2016  . Anxiety 02/20/2014  . Atrial fibrillation ([place])   . CAD (coronary artery disease)   . Cerebral ischemia 03/11/2016   Overview:  Microvascular ischemia seen on MRI 01/2015 with partial empty sella turcica neurology appt 03/12/2016 pending with [facility], also has [facility] with PT coming to the house for her gait abnormality and lumbar DDD, as well as local ortho ([place])   . Chest pain 11/03/2018  . CHF (congestive heart failure) ([place]) 06/24/2015   Last Assessment & Plan:  Relevant Hx: Course: Daily Update: Today's Plan:she has been doing well overall from her heart and no SOB  Electronically signed by: Mayer Camel, NP 06/26/15 1522  . Chronic diastolic heart failure ([place]) 06/24/2015   Last Assessment & Plan:  Relevant Hx: Course: Daily Update: Today's Plan:she has been doing well overall from her heart and no SOB  Electronically signed by: Mayer Camel, NP 06/26/15 1522  . Chronic UTI 06/27/2015   Last Assessment & Plan:  She requested a UA be checked for her as she was having more frequency than before and it has been awhile since she was on oral abx for this, she is on low dose maintenance med to prevent and has done well with that.  . CKD stage 3 due to type 2 diabetes mellitus ([place]) 02/24/2018  . Colon polyps    . COPD (chronic obstructive pulmonary disease) ([place]) 03/09/2012   Last Assessment & Plan:  She feels she is stable from this discussed with her the allergies she has and she is going to discuss with Dr. Alcide Clever having allergy injections as well for this   . Diabetes ([place]) 02/20/2014  . Dizziness 02/07/2016   Overview:  Chronic and has been having falls, MRI showing chronic microvascular ischemia, as well as partial sella turcica pending neuro appt for her with [facility]  . Dyslipidemia 07/29/2015  . Edema 06/24/2015   Last Assessment & Plan:  Relevant Hx: Course: Daily Update: Today's Plan:this is stable for her overall and will follow for her  Electronically signed by: Mayer Camel, NP 06/27/15 0813  . Elevated cholesterol   . Essential hypertension 03/09/2012   Last Assessment & Plan:  On repeat this was stable for her and she does not check it at home much but when has been checked has been fine for her  . Fistula 02/20/2014  . Gastroesophageal reflux disease without esophagitis 02/20/2014  Last Assessment & Plan:  Relevant Hx: Course: Daily Update: Today's Plan:not on meds for this as she has had issues with her arms cramping and she is going to just drink buttermilk  Electronically signed by: Mayer Camel, NP 06/26/15 1530  . Generalized abdominal pain 10/24/2016  . Hallux valgus of left foot 12/04/2015  . Hallux valgus of right foot 12/04/2015  . High risk medication use 06/24/2015  . History of recurrent UTIs 06/15/2019  . Hx of valvular heart disease 01/29/2016  . Hypertension 02/20/2014  . Hypertensive heart disease with heart failure ([place]) 03/09/2012   Last Assessment & Plan:  On repeat this was stable for her and she does not check it at home much but when has been checked has been fine for her  . Hypothyroidism (acquired) 03/09/2012   Last Assessment & Plan:  She was due for this to be updated and have advised her to get this drawn while here today  . IBS (irritable  bowel syndrome) 06/24/2015   Last Assessment & Plan:  Relevant Hx: Course: Daily Update: Today's Plan:this is stable for her  Electronically signed by: Mayer Camel, NP 06/26/15 1531  . IBS (irritable bowel syndrome)   . Idiopathic progressive polyneuropathy 06/07/2009  . Left bundle branch block 06/24/2015  . Leukocytosis 06/24/2015   Last Assessment & Plan:  Relevant Hx: Course: Daily Update: Today's Plan:this has been stable for her with FU through hematology who felt she was having benign swings in this and she chose to not have bone marrow biopsy done   Electronically signed by: Mayer Camel, NP 06/27/15 [phone]  . Liposarcoma, well differentiated type ([place]) 03/09/2012   Overview:  Initial excision 03/18/2000 (Dr. [facility]alene [facility]), 8 x 6 x 3 cm. Local recurrence, re-excision 03/23/2007 [facility]se), 5 x 3 x 2 cm. Surveillance plan: baseline MRI April 2014  Last Assessment & Plan:  Relevant Hx: Course: Daily Update: Today's Plan:she is stable from this  Electronically signed by: Mayer Camel, NP 06/26/15 1533  . Lumbar degenerative disc disease 11/06/2015   Last Assessment & Plan:  At this point she needs updated MRI of her lumbar spine and we discussed with her gait abnormality she is likely going to have to see specialist at [facility] for this, she has seen local neuro for this several years ago in terms of her neuropathy and her falls and he really did not address it . She was advised several years ago to have back surgery locally which we did not feel was ideal with her sugars at the time being so out of control and now she is really more in control than then and it may be doable for her if felt  Needed though she would have to have cardiology clear her as well.  . Mild CAD 07/29/2015   Overview:  Overview:  Cath march 2016 with 60% LAD with normal FFR   Overview:  Cath march 2016 with 60% LAD with normal FFR   . Mixed hyperlipidemia 06/24/2015   Last  Assessment & Plan:  Relevant Hx: Course: Daily Update: Today's Plan:update her lipids for her today and she is trying to watch her diet   Electronically signed by: Mayer Camel, NP 06/27/15 0813  . Obstructive sleep apnea syndrome 06/24/2015  . Onychomycosis due to dermatophyte 11/27/2015  . 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   Last Assessment & Plan:  Relevant Hx: Course: Daily Update: Today's Plan:she is doing well overall  Electronically signed by:  Mayer Camel, NP 06/26/15 1531  . Pancreatitis   . Pre-ulcerative calluses 12/04/2015  . Pre-ulcerative corn or callous 11/27/2015  . Primary osteoarthritis involving multiple joints 07/06/2015   Last Assessment & Plan:  Relevant Hx: Course: Daily Update: Today's Plan:she has DDD of her back with some spinal  Stenosis that she has seen ortho locally for and he wanted to do surgery which she has avoided and I agree with, she is going to use the flexeril for this right now as she is having more muscle spasm than what she has had in the past from her attempt to walk down some very steep steps that she was not used to . She should use her walker for support through the weekend and avoid overhead strain and lifting and stay in touch if not better.  Electronically signed by: Mayer Camel, NP 07/06/15 [phone]  . Seasonal allergic rhinitis 01/12/2019  . Sleep apnea   . Stress incontinence of urine 04/17/2016  . Syncope and collapse 10/13/2016  . Thyroid disease   . TIA (transient ischemic attack) 10/13/2016  . Type 2 diabetes mellitus without complication, without long-term current use of insulin ([place]) 03/09/2012   Last Assessment & Plan:  She brings in readings of her sugars which show she has been doing much better overall with this at home and quite pleased with her  . UTI (urinary tract infection)   . Vascular calcification 01/29/2016  . Visual changes 01/29/2016   Overview:  Following with Dr. Julio Alm as well as had recent carotid  doppler showing minimal plaque present and vertebral arteries normal , nothing signficant  . Vitamin B12 deficiency 12/04/2015    Past Surgical History:  Procedure Laterality Date  . ABDOMINAL HYSTERECTOMY    . CHOLECYSTECTOMY    . EYE SURGERY    . PANCREAS SURGERY    . skin cancer lesion      Current Medications: Current Meds  Medication Sig  . Alirocumab (PRALUENT) 150 MG/ML SOAJ Inject 150 mg into the skin every 14 (fourteen) days.  . ALPRAZolam (XANAX) 0.5 MG tablet Take 0.5 mg by mouth 3 (three) times daily as needed for anxiety.   Marland Kitchen amLODipine (NORVASC) 5 MG tablet Take 2.5 mg by mouth daily as needed.  Marland Kitchen amoxicillin (AMOXIL) 250 MG capsule Take 250 mg by mouth daily.  Marland Kitchen ascorbic acid (VITAMIN C) 1000 MG tablet Take 1,000 mg by mouth daily.  Marland Kitchen aspirin EC 81 MG tablet Take 81 mg by mouth daily.  . benazepril (LOTENSIN) 20 MG tablet Take 20 mg by mouth daily.  . Blood Glucose Monitoring Suppl (ACCU-CHEK AVIVA PLUS) w/Device KIT See admin instructions.  . Cholecalciferol 25 MCG (1000 UT) tablet Take 1,000 Units by mouth daily.  . Cranberry (RA CRANBERRY) 500 MG CAPS Take 1 Can by mouth 2 (two) times daily.  . cyanocobalamin (,VITAMIN B-12,) 1000 MCG/ML injection Inject into the muscle every 30 (thirty) days.  Marland Kitchen donepezil (ARICEPT) 5 MG tablet Take 5 mg by mouth at bedtime.  . fluticasone (FLONASE) 50 MCG/ACT nasal spray Place 1 spray into the nose 2 (two) times daily.  . furosemide (LASIX) 40 MG tablet TAKE 1 TABLET BY MOUTH TWICE(2) DAILY  . gabapentin (NEURONTIN) 300 MG capsule Take 300 mg by mouth daily.  Marland Kitchen glimepiride (AMARYL) 1 MG tablet Take 0.5 tablets by mouth daily.  . Insulin Glargine (LANTUS SOLOSTAR) 100 UNIT/ML Solostar Pen Inject 10 units at HS may increase to max 40 units per day based upon  blood sugars  . levothyroxine (SYNTHROID, LEVOTHROID) 75 MCG tablet Take 75 mcg by mouth daily.  . metFORMIN (GLUCOPHAGE-XR) 500 MG 24 hr tablet Take 500 mg by mouth 2 (two) times  daily.  . metoprolol tartrate (LOPRESSOR) 50 MG tablet Take 50 mg by mouth 2 (two) times daily.  . Mirabegron (MYRBETRIQ PO) Take 50 mg by mouth daily.  . montelukast (SINGULAIR) 10 MG tablet Take 10 mg by mouth at bedtime.  . nitroGLYCERIN (NITROSTAT) 0.4 MG SL tablet Place 1 tablet (0.4 mg total) under the tongue every 5 (five) minutes x 3 doses as needed for chest pain.  . SYMBICORT 160-4.5 MCG/ACT inhaler Inhale 2 puffs into the lungs 2 (two) times daily.  Marland Kitchen zinc gluconate 50 MG tablet Take 1 tablet by mouth daily.     Allergies:   Doxycycline, Nitrofurantoin, and Statins   Social History   Socioeconomic History  . Marital status: Widowed    Spouse name: Not on file  . Number of children: Not on file  . Years of education: Not on file  . Highest education level: Not on file  Occupational History  . Not on file  Tobacco Use  . Smoking status: Never Smoker  . Smokeless tobacco: Current User    Types: Snuff  Substance and Sexual Activity  . Alcohol use: No  . Drug use: No  . Sexual activity: Not on file  Other Topics Concern  . Not on file  Social History Narrative  . Not on file   Social Determinants of Health   Financial Resource Strain:   . Difficulty of Paying Living Expenses:   Food Insecurity:   . Worried About Charity fundraiser in the Last Year:   . Arboriculturist in the Last Year:   Transportation Needs:   . Film/video editor (Medical):   Marland Kitchen Lack of Transportation (Non-Medical):   Physical Activity:   . Days of Exercise per Week:   . Minutes of Exercise per Session:   Stress:   . Feeling of Stress :   Social Connections:   . Frequency of Communication with Friends and Family:   . Frequency of Social Gatherings with Friends and Family:   . Attends Religious Services:   . Active Member of Clubs or Organizations:   . Attends Archivist Meetings:   Marland Kitchen Marital Status:      Family History: The patient's family history includes Cancer in  her sister; Diabetes in her mother; Hypertension in her father and mother. ROS:   Please see the history of present illness.    All other systems reviewed and are negative.  EKGs/Labs/Other Studies Reviewed:    The following studies were reviewed today:  EKG:  EKG ordered today and personally reviewed.  The ekg ordered today demonstrates sinus rhythm left bundle branch block unchanged from previous  Recent Labs:  07/25/2019: A1c 6.4% BMP normal except for random glucose 108 creatinine 0.88 normal liver function TSH normal Cholesterol 165 triglycerides 113 LDL at target 93 HDL 57 Globin 11.6 she had an elevated white count of 14,900  03/28/2019: ALT 14; BUN 25; Creatinine, Ser 1.20; NT-Pro BNP 343; Potassium 4.1; Sodium 137  Recent Lipid Panel    Component Value Date/Time   CHOL 208 (H) 03/28/2019 1519   TRIG 190 (H) 03/28/2019 1519   HDL 86 03/28/2019 1519   CHOLHDL 2.4 03/28/2019 1519   LDLCALC 91 03/28/2019 1519    Physical Exam:    VS:  BP 140/74   Pulse (!) 55   Temp (!) 97.5 F (36.4 C)   Ht '5\' 4"'$  (1.626 m)   Wt 157 lb 12.8 oz (71.6 kg)   SpO2 98%   BMI 27.09 kg/m     Wt Readings from Last 3 Encounters:  08/01/19 157 lb 12.8 oz (71.6 kg)  03/28/19 151 lb (68.5 kg)  12/22/18 153 lb 6.4 oz (69.6 kg)     GEN: Frail well nourished, well developed in no acute distress HEENT: Normal NECK: No JVD; No carotid bruits LYMPHATICS: No lymphadenopathy CARDIAC: Paradoxical second heart sound RRR, no murmurs, rubs, gallops RESPIRATORY:  Clear to auscultation without rales, wheezing or rhonchi  ABDOMEN: Soft, non-tender, non-distended MUSCULOSKELETAL:  No edema; No deformity  SKIN: Warm and dry NEUROLOGIC:  Alert and oriented x 3 PSYCHIATRIC:  Normal affect    Signed, Shirlee More, MD  08/01/2019 9:08 AM    [place]

## 2019-08-01 ENCOUNTER — Telehealth: Payer: Self-pay

## 2019-08-01 ENCOUNTER — Ambulatory Visit (INDEPENDENT_AMBULATORY_CARE_PROVIDER_SITE_OTHER): Payer: Medicare Other | Admitting: Cardiology

## 2019-08-01 ENCOUNTER — Other Ambulatory Visit: Payer: Self-pay

## 2019-08-01 ENCOUNTER — Encounter: Payer: Self-pay | Admitting: Cardiology

## 2019-08-01 VITALS — BP 140/74 | HR 55 | Temp 97.5°F | Ht 64.0 in | Wt 157.8 lb

## 2019-08-01 DIAGNOSIS — I447 Left bundle-branch block, unspecified: Secondary | ICD-10-CM

## 2019-08-01 DIAGNOSIS — I25118 Atherosclerotic heart disease of native coronary artery with other forms of angina pectoris: Secondary | ICD-10-CM | POA: Diagnosis not present

## 2019-08-01 DIAGNOSIS — E782 Mixed hyperlipidemia: Secondary | ICD-10-CM | POA: Diagnosis not present

## 2019-08-01 DIAGNOSIS — I110 Hypertensive heart disease with heart failure: Secondary | ICD-10-CM

## 2019-08-01 DIAGNOSIS — R079 Chest pain, unspecified: Secondary | ICD-10-CM

## 2019-08-01 LAB — TROPONIN T: Troponin T (Highly Sensitive): 11 ng/L (ref 0–14)

## 2019-08-01 MED ORDER — ISOSORBIDE MONONITRATE ER 30 MG PO TB24: 30.0000 mg | ORAL_TABLET | Freq: Every day | ORAL | 3 refills | Status: DC

## 2019-08-01 NOTE — Telephone Encounter (Signed)
-----   Message from Richardo Priest, MD sent at 08/01/2019  1:41 PM EDT ----- Her high-sensitivity troponin is normal.  Seen today in the office no new changes

## 2019-08-01 NOTE — Telephone Encounter (Signed)
Spoke with patient regarding results.  Patient verbalizes understanding and is agreeable to plan of care. Advised patient to call back with any issues or concerns.  

## 2019-08-01 NOTE — Patient Instructions (Signed)
Medication Instructions:  Your physician has recommended you make the following change in your medication:  START Imdur 30 mg take one tablet by mouth daily at 8pm. *If you need a refill on your cardiac medications before your next appointment, please call your pharmacy*   Lab Work: Your physician recommends that you return for lab work in: TODAY Troponin If you have labs (blood work) drawn today and your tests are completely normal, you will receive your results only by: Marland Kitchen MyChart Message (if you have MyChart) OR . A paper copy in the mail If you have any lab test that is abnormal or we need to change your treatment, we will call you to review the results.   Testing/Procedures: None   Follow-Up: At [facility], you and your health needs are our priority.  As part of our continuing mission to provide you with exceptional heart care, we have created designated Provider Care Teams.  These Care Teams include your primary Cardiologist (physician) and Advanced Practice Providers (APPs -  Physician Assistants and Nurse Practitioners) who all work together to provide you with the care you need, when you need it.  We recommend signing up for the patient portal called "MyChart".  Sign up information is provided on this After Visit Summary.  MyChart is used to connect with patients for Virtual Visits (Telemedicine).  Patients are able to view lab/test results, encounter notes, upcoming appointments, etc.  Non-urgent messages can be sent to your provider as well.   To learn more about what you can do with MyChart, go to [email or web address].    Your next appointment:   6 week(s)  The format for your next appointment:   In Person  Provider:   Shirlee More, MD   Other Instructions

## 2019-09-12 ENCOUNTER — Other Ambulatory Visit: Payer: Self-pay

## 2019-09-14 ENCOUNTER — Ambulatory Visit: Payer: Medicare Other | Admitting: Cardiology

## 2019-09-14 ENCOUNTER — Other Ambulatory Visit: Payer: Self-pay

## 2019-09-14 ENCOUNTER — Encounter: Payer: Self-pay | Admitting: Cardiology

## 2019-09-14 ENCOUNTER — Telehealth: Payer: Self-pay

## 2019-09-14 VITALS — BP 144/82 | HR 55 | Temp 97.1°F | Ht 64.0 in | Wt 161.2 lb

## 2019-09-14 DIAGNOSIS — I25118 Atherosclerotic heart disease of native coronary artery with other forms of angina pectoris: Secondary | ICD-10-CM | POA: Diagnosis not present

## 2019-09-14 DIAGNOSIS — I5032 Chronic diastolic (congestive) heart failure: Secondary | ICD-10-CM | POA: Diagnosis not present

## 2019-09-14 DIAGNOSIS — E782 Mixed hyperlipidemia: Secondary | ICD-10-CM | POA: Diagnosis not present

## 2019-09-14 DIAGNOSIS — I110 Hypertensive heart disease with heart failure: Secondary | ICD-10-CM

## 2019-09-14 MED ORDER — PRALUENT 150 MG/ML ~~LOC~~ SOAJ: 150.0000 mg | SUBCUTANEOUS | 3 refills | Status: DC

## 2019-09-14 NOTE — Progress Notes (Signed)
Cardiology Office Note:    Date:  09/14/2019   ID:  Wendy Leon, DOB February 15, 1933, MRN [id number]  PCP:  Raina Mina., MD  Cardiologist:  Shirlee More, MD    Referring MD: Raina Mina., MD    ASSESSMENT:    1. Coronary artery disease of native artery of native heart with stable angina pectoris ([place])   2. Mixed hyperlipidemia   3. Hypertensive heart disease with heart failure ([place])   4. Chronic diastolic heart failure (HCC)    PLAN:    In order of problems listed above:  1. She has done quite well since we added Imdur in the evening she has had no further nocturnal angina she has no daytime chest pain shortness of breath palpitation or syncope tolerates her medications.  PCSK9 and is generally quite pleased with her life.  After nice discussion patient and her daughter who is present we will continue this current medical regimen.  I think she is a poor candidate for elective cardiac revascularization with so many comorbidities 2. Improved continue PCSK9 inhibitor her lipids are at target 3. Stable BP at target no evidence of heart failure on exam no fluid overload and she is New York Heart Association class I.  Continue her current antihypertensives including amlodipine Benzapril 10-minute metoprolol she is having no orthostatic symptoms 4. Heart failure compensated New York Heart Association class I continue her loop diuretic recent labs show normal renal function and potassium   Next appointment: 6 months   Medication Adjustments/Labs and Tests Ordered: Current medicines are reviewed at length with the patient today.  Concerns regarding medicines are outlined above.  No orders of the defined types were placed in this encounter.  No orders of the defined types were placed in this encounter.   Chief Complaint  Patient presents with  . Follow-up  . Coronary Artery Disease    History of Present Illness:    Wendy Leon is a 86 y.o. female with a hx of pancreatic  cancer,mild CAD 2016 with 60% LAD with normal FFR , Dyslipidemia with statin intolerance, HTN, LBBB and costochondral chest pain seen 03/28/2019.  With her comorbidities and after shared decision making the patient opted for medical therapy and CAD.  When seen with her PCP 07/25/2019 she was having frequent episodes of chest discomfort at night relieved with nitroglycerin.  She is statin intolerant uses PCSK9 inhibitor and her LDL is at target.  She is on aspirin lipid-lowering treatment and 2 antianginal medications beta-blocker and calcium channel blocker.   last seen 08/01/2019. Compliance with diet, lifestyle and medications: Yes  In general yes  Cardiac CTA 11/22/2018: Calcium Score: 738 Agatston units. Coronary Arteries: Right dominant with no anomalies LM: Calcified plaque distal left main, no significant stenosis. LAD system: Calcified plaque proximal and mid LAD, suspect no more than mild (<50%) stenosis. Circumflex system: Calcified plaque in the proximal LCx, mild (<50%) stenosis. RCA system: Mixed plaque in the proximal RCA with mild (<50%) stenosis. Calcified plaque mid RCA, suspect no more than 50% stenosis but difficult given blooming artifact.  IMPRESSION: 1. Coronary artery calcium score 738 Agatston units. This places the patient in the 82nd percentile for age and gender. 2. Suspect nonobstructive CAD but will send for FFR to confirm  EXAM: CT FFR FINDINGS: FFR 0.66 mid to distal LAD FFR 0.89 mid RCA FFR 0.87 mid LCx IMPRESSION: This suggests the presence of a hemodynamically significant proximal LAD stenosis. Visually, there was calcified plaque throughout the  proximal and mid LAD that did not appear obstructive.   Ref Range & Units 1 mo ago  Troponin T (Highly Sensitive) 0 - 14 ng/L 11     Past Medical History:  Diagnosis Date  . Abnormal gait 06/07/2009   Last Assessment & Plan:  She saw specialist who has given her orders for PT and she is wanting to  have it setup for her at [facility] where she already is receiving care from at home  . Acid reflux 02/20/2014  . Aftercare following surgery 11/27/2015  . Anemia of unknown etiology 10/18/2016  . Anxiety 02/20/2014  . Atrial fibrillation ([place])   . CAD (coronary artery disease)   . Cerebral ischemia 03/11/2016   Overview:  Microvascular ischemia seen on MRI 01/2015 with partial empty sella turcica neurology appt 03/12/2016 pending with [facility], also has [facility] with PT coming to the house for her gait abnormality and lumbar DDD, as well as local ortho ([place])   . Chest pain 11/03/2018  . CHF (congestive heart failure) ([place]) 06/24/2015   Last Assessment & Plan:  Relevant Hx: Course: Daily Update: Today's Plan:she has been doing well overall from her heart and no SOB  Electronically signed by: Mayer Camel, NP 06/26/15 1522  . Chronic diastolic heart failure ([place]) 06/24/2015   Last Assessment & Plan:  Relevant Hx: Course: Daily Update: Today's Plan:she has been doing well overall from her heart and no SOB  Electronically signed by: Mayer Camel, NP 06/26/15 1522  . Chronic UTI 06/27/2015   Last Assessment & Plan:  She requested a UA be checked for her as she was having more frequency than before and it has been awhile since she was on oral abx for this, she is on low dose maintenance med to prevent and has done well with that.  . CKD stage 3 due to type 2 diabetes mellitus ([place]) 02/24/2018  . Colon polyps   . COPD (chronic obstructive pulmonary disease) ([place]) 03/09/2012   Last Assessment & Plan:  She feels she is stable from this discussed with her the allergies she has and she is going to discuss with Dr. Alcide Clever having allergy injections as well for this   . Diabetes ([place]) 02/20/2014  . Dizziness 02/07/2016   Overview:  Chronic and has been having falls, MRI showing chronic microvascular ischemia, as well as partial sella turcica pending neuro appt for her with [facility]  .  Dyslipidemia 07/29/2015  . Edema 06/24/2015   Last Assessment & Plan:  Relevant Hx: Course: Daily Update: Today's Plan:this is stable for her overall and will follow for her  Electronically signed by: Mayer Camel, NP 06/27/15 0813  . Elevated cholesterol   . Essential hypertension 03/09/2012   Last Assessment & Plan:  On repeat this was stable for her and she does not check it at home much but when has been checked has been fine for her  . Fistula 02/20/2014  . Gastroesophageal reflux disease without esophagitis 02/20/2014   Last Assessment & Plan:  Relevant Hx: Course: Daily Update: Today's Plan:not on meds for this as she has had issues with her arms cramping and she is going to just drink buttermilk  Electronically signed by: Mayer Camel, NP 06/26/15 1530  . Generalized abdominal pain 10/24/2016  . Hallux valgus of left foot 12/04/2015  . Hallux valgus of right foot 12/04/2015  . High risk medication use 06/24/2015  . History of recurrent UTIs 06/15/2019  . Hx  of valvular heart disease 01/29/2016  . Hypertension 02/20/2014  . Hypertensive heart disease with heart failure ([place]) 03/09/2012   Last Assessment & Plan:  On repeat this was stable for her and she does not check it at home much but when has been checked has been fine for her  . Hypothyroidism (acquired) 03/09/2012   Last Assessment & Plan:  She was due for this to be updated and have advised her to get this drawn while here today  . IBS (irritable bowel syndrome) 06/24/2015   Last Assessment & Plan:  Relevant Hx: Course: Daily Update: Today's Plan:this is stable for her  Electronically signed by: Mayer Camel, NP 06/26/15 1531  . IBS (irritable bowel syndrome)   . Idiopathic progressive polyneuropathy 06/07/2009  . Left bundle branch block 06/24/2015  . Leukocytosis 06/24/2015   Last Assessment & Plan:  Relevant Hx: Course: Daily Update: Today's Plan:this has been stable for her with FU through  hematology who felt she was having benign swings in this and she chose to not have bone marrow biopsy done   Electronically signed by: Mayer Camel, NP 06/27/15 [phone]  . 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 Ralene Cork [facility]), 8 x 6 x 3 cm. Local recurrence, re-excision 03/23/2007 Candiss Norse), 5 x 3 x 2 cm. Surveillance plan: baseline MRI April 2014  Last Assessment & Plan:  Relevant Hx: Course: Daily Update: Today's Plan:she is stable from this  Electronically signed by: Mayer Camel, NP 06/26/15 1533  . Lumbar degenerative disc disease 11/06/2015   Last Assessment & Plan:  At this point she needs updated MRI of her lumbar spine and we discussed with her gait abnormality she is likely going to have to see specialist at [facility] for this, she has seen local neuro for this several years ago in terms of her neuropathy and her falls and he really did not address it . She was advised several years ago to have back surgery locally which we did not feel was ideal with her sugars at the time being so out of control and now she is really more in control than then and it may be doable for her if felt  Needed though she would have to have cardiology clear her as well.  . Mild CAD 07/29/2015   Overview:  Overview:  Cath march 2016 with 60% LAD with normal FFR   Overview:  Cath march 2016 with 60% LAD with normal FFR   . Mixed hyperlipidemia 06/24/2015   Last Assessment & Plan:  Relevant Hx: Course: Daily Update: Today's Plan:update her lipids for her today and she is trying to watch her diet   Electronically signed by: Mayer Camel, NP 06/27/15 0813  . Obstructive sleep apnea syndrome 06/24/2015  . Onychomycosis due to dermatophyte 11/27/2015  . 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   Last Assessment & Plan:  Relevant Hx: Course: Daily Update: Today's Plan:she is doing well overall  Electronically signed by: Mayer Camel, NP 06/26/15 1531  . Pancreatitis   . Pre-ulcerative calluses 12/04/2015  . Pre-ulcerative corn or callous 11/27/2015  . Primary osteoarthritis involving multiple joints 07/06/2015   Last Assessment & Plan:  Relevant Hx: Course: Daily Update: Today's Plan:she has DDD of her back with some spinal  Stenosis that she has seen ortho locally for and he wanted to do surgery which she has avoided and I agree with, she is going to use the flexeril  for this right now as she is having more muscle spasm than what she has had in the past from her attempt to walk down some very steep steps that she was not used to . She should use her walker for support through the weekend and avoid overhead strain and lifting and stay in touch if not better.  Electronically signed by: Mayer Camel, NP 07/06/15 [phone]  . Seasonal allergic rhinitis 01/12/2019  . Sleep apnea   . Stress incontinence of urine 04/17/2016  . Syncope and collapse 10/13/2016  . Thyroid disease   . TIA (transient ischemic attack) 10/13/2016  . Type 2 diabetes mellitus without complication, without long-term current use of insulin ([place]) 03/09/2012   Last Assessment & Plan:  She brings in readings of her sugars which show she has been doing much better overall with this at home and quite pleased with her  . UTI (urinary tract infection)   . Vascular calcification 01/29/2016  . Visual changes 01/29/2016   Overview:  Following with Dr. Julio Alm as well as had recent carotid doppler showing minimal plaque present and vertebral arteries normal , nothing signficant  . Vitamin B12 deficiency 12/04/2015    Past Surgical History:  Procedure Laterality Date  . ABDOMINAL HYSTERECTOMY    . CHOLECYSTECTOMY    . EYE SURGERY    . PANCREAS SURGERY    . skin cancer lesion      Current Medications: Current Meds  Medication Sig  . Alirocumab (PRALUENT) 150 MG/ML SOAJ Inject 150 mg into the skin every 14 (fourteen) days.  . ALPRAZolam  (XANAX) 0.5 MG tablet Take 0.5 mg by mouth 3 (three) times daily as needed for anxiety.   Marland Kitchen amLODipine (NORVASC) 5 MG tablet Take 2.5 mg by mouth daily as needed.  Marland Kitchen amoxicillin (AMOXIL) 250 MG capsule Take 250 mg by mouth daily.  Marland Kitchen ascorbic acid (VITAMIN C) 1000 MG tablet Take 1,000 mg by mouth daily.  Marland Kitchen aspirin EC 81 MG tablet Take 81 mg by mouth daily.  . benazepril (LOTENSIN) 20 MG tablet Take 20 mg by mouth daily.  . Blood Glucose Monitoring Suppl (ACCU-CHEK AVIVA PLUS) w/Device KIT See admin instructions.  . Cholecalciferol 25 MCG (1000 UT) tablet Take 1,000 Units by mouth daily.  . Cranberry (RA CRANBERRY) 500 MG CAPS Take 1 Can by mouth 2 (two) times daily.  . cyanocobalamin (,VITAMIN B-12,) 1000 MCG/ML injection Inject into the muscle every 30 (thirty) days.  Marland Kitchen donepezil (ARICEPT) 5 MG tablet Take 5 mg by mouth at bedtime.  . fluticasone (FLONASE) 50 MCG/ACT nasal spray Place 1 spray into the nose 2 (two) times daily.  . furosemide (LASIX) 40 MG tablet TAKE 1 TABLET BY MOUTH TWICE(2) DAILY  . gabapentin (NEURONTIN) 300 MG capsule Take 300 mg by mouth daily.  Marland Kitchen glimepiride (AMARYL) 1 MG tablet Take 0.5 tablets by mouth daily.  . Insulin Glargine (LANTUS SOLOSTAR) 100 UNIT/ML Solostar Pen Inject 10 units at HS may increase to max 40 units per day based upon blood sugars  . isosorbide mononitrate (IMDUR) 30 MG 24 hr tablet Take 1 tablet (30 mg total) by mouth daily.  Marland Kitchen levothyroxine (SYNTHROID, LEVOTHROID) 75 MCG tablet Take 75 mcg by mouth daily.  . metFORMIN (GLUCOPHAGE-XR) 500 MG 24 hr tablet Take 500 mg by mouth 2 (two) times daily.  . metoprolol tartrate (LOPRESSOR) 50 MG tablet Take 50 mg by mouth 2 (two) times daily.  . Mirabegron (MYRBETRIQ PO) Take 50 mg by mouth daily.  Marland Kitchen  montelukast (SINGULAIR) 10 MG tablet Take 10 mg by mouth at bedtime.  . nitroGLYCERIN (NITROSTAT) 0.4 MG SL tablet Place 1 tablet (0.4 mg total) under the tongue every 5 (five) minutes x 3 doses as needed for  chest pain.  . SYMBICORT 160-4.5 MCG/ACT inhaler Inhale 2 puffs into the lungs 2 (two) times daily.     Allergies:   Doxycycline, Nitrofurantoin, and Statins   Social History   Socioeconomic History  . Marital status: Widowed    Spouse name: Not on file  . Number of children: Not on file  . Years of education: Not on file  . Highest education level: Not on file  Occupational History  . Not on file  Tobacco Use  . Smoking status: Never Smoker  . Smokeless tobacco: Current User    Types: Snuff  Substance and Sexual Activity  . Alcohol use: No  . Drug use: No  . Sexual activity: Not on file  Other Topics Concern  . Not on file  Social History Narrative  . Not on file   Social Determinants of Health   Financial Resource Strain:   . Difficulty of Paying Living Expenses:   Food Insecurity:   . Worried About Charity fundraiser in the Last Year:   . Arboriculturist in the Last Year:   Transportation Needs:   . Film/video editor (Medical):   Marland Kitchen Lack of Transportation (Non-Medical):   Physical Activity:   . Days of Exercise per Week:   . Minutes of Exercise per Session:   Stress:   . Feeling of Stress :   Social Connections:   . Frequency of Communication with Friends and Family:   . Frequency of Social Gatherings with Friends and Family:   . Attends Religious Services:   . Active Member of Clubs or Organizations:   . Attends Archivist Meetings:   Marland Kitchen Marital Status:      Family History: The patient's family history includes Cancer in her sister; Diabetes in her mother; Hypertension in her father and mother. ROS:   Please see the history of present illness.    All other systems reviewed and are negative.  EKGs/Labs/Other Studies Reviewed:    The following studies were reviewed today:   Recent Labs:  07/25/2019 [place] with her PCP: CMP shows a potassium 4.0 creatinine 0.88 normal liver function these tests are normal TSH normal  4.64 Lipid profile is ideal cholesterol 165 triglycerides 113 HDL 57 LDL 93 her baseline LDL was 143 03/28/2019: ALT 14; BUN 25; Creatinine, Ser 1.20; NT-Pro BNP 343; Potassium 4.1; Sodium 137  Recent Lipid Panel    Component Value Date/Time   CHOL 208 (H) 03/28/2019 1519   TRIG 190 (H) 03/28/2019 1519   HDL 86 03/28/2019 1519   CHOLHDL 2.4 03/28/2019 1519   LDLCALC 91 03/28/2019 1519    Physical Exam:    VS:  BP (!) 144/82   Pulse (!) 55   Temp (!) 97.1 F (36.2 C)   Ht '5\' 4"'$  (1.626 m)   Wt 161 lb 3.2 oz (73.1 kg)   SpO2 95%   BMI 27.67 kg/m     Wt Readings from Last 3 Encounters:  09/14/19 161 lb 3.2 oz (73.1 kg)  08/01/19 157 lb 12.8 oz (71.6 kg)  03/28/19 151 lb (68.5 kg)     GEN: Appears her age well nourished, well developed in no acute distress HEENT: Normal NECK: No JVD; No carotid bruits  LYMPHATICS: No lymphadenopathy CARDIAC: RRR, no murmurs, rubs, gallops RESPIRATORY:  Clear to auscultation without rales, wheezing or rhonchi  ABDOMEN: Soft, non-tender, non-distended MUSCULOSKELETAL:  No edema; No deformity  SKIN: Warm and dry NEUROLOGIC:  Alert and oriented x 3 PSYCHIATRIC:  Normal affect    Signed, Shirlee More, MD  09/14/2019 1:30 PM    [place]

## 2019-09-14 NOTE — Telephone Encounter (Signed)
Patients daughter walked into our office just now and asked that the patients Praluent be sent in to the [facility] in [place]. She states that they have been out of it at [facility] for some time now and that she needs the order to be sent in to [facility].   I sent it in for her at this time.  No other issues or concerns were noted at this time.    Encouraged patient to call back with any questions or concerns.

## 2019-09-14 NOTE — Patient Instructions (Addendum)

## 2019-10-06 DIAGNOSIS — G301 Alzheimer's disease with late onset: Secondary | ICD-10-CM | POA: Insufficient documentation

## 2019-10-06 DIAGNOSIS — F0280 Dementia in other diseases classified elsewhere without behavioral disturbance: Secondary | ICD-10-CM

## 2019-10-06 HISTORY — DX: Dementia in other diseases classified elsewhere, unspecified severity, without behavioral disturbance, psychotic disturbance, mood disturbance, and anxiety: F02.80

## 2019-10-25 DIAGNOSIS — D649 Anemia, unspecified: Secondary | ICD-10-CM | POA: Diagnosis not present

## 2019-10-25 DIAGNOSIS — D72828 Other elevated white blood cell count: Secondary | ICD-10-CM

## 2019-10-25 DIAGNOSIS — D6959 Other secondary thrombocytopenia: Secondary | ICD-10-CM | POA: Diagnosis not present

## 2019-10-25 DIAGNOSIS — Z9081 Acquired absence of spleen: Secondary | ICD-10-CM | POA: Diagnosis not present

## 2019-11-28 ENCOUNTER — Other Ambulatory Visit: Payer: Self-pay | Admitting: Cardiology

## 2019-12-16 DIAGNOSIS — Z8616 Personal history of COVID-19: Secondary | ICD-10-CM

## 2019-12-16 HISTORY — DX: Personal history of COVID-19: Z86.16

## 2019-12-17 LAB — COLOGUARD: COLOGUARD: NEGATIVE

## 2020-01-23 ENCOUNTER — Other Ambulatory Visit: Payer: Self-pay | Admitting: Cardiology

## 2020-02-06 DIAGNOSIS — E7800 Pure hypercholesterolemia, unspecified: Secondary | ICD-10-CM | POA: Insufficient documentation

## 2020-02-06 DIAGNOSIS — E079 Disorder of thyroid, unspecified: Secondary | ICD-10-CM | POA: Insufficient documentation

## 2020-02-06 DIAGNOSIS — I4891 Unspecified atrial fibrillation: Secondary | ICD-10-CM | POA: Insufficient documentation

## 2020-02-06 DIAGNOSIS — K859 Acute pancreatitis without necrosis or infection, unspecified: Secondary | ICD-10-CM | POA: Insufficient documentation

## 2020-02-06 DIAGNOSIS — K635 Polyp of colon: Secondary | ICD-10-CM | POA: Insufficient documentation

## 2020-02-06 DIAGNOSIS — N390 Urinary tract infection, site not specified: Secondary | ICD-10-CM | POA: Insufficient documentation

## 2020-02-06 DIAGNOSIS — G4730 Sleep apnea, unspecified: Secondary | ICD-10-CM | POA: Insufficient documentation

## 2020-02-06 DIAGNOSIS — I2510 Atherosclerotic heart disease of native coronary artery without angina pectoris: Secondary | ICD-10-CM | POA: Insufficient documentation

## 2020-02-07 NOTE — Progress Notes (Addendum)
Cardiology Office Note:    Date:  02/08/2020   ID:  Wendy Leon, DOB 08-16-1932, MRN [id number]  PCP:  Gordan Payment., MD  Cardiologist:  Norman Herrlich, MD    Referring MD: Gordan Payment., MD    ASSESSMENT:    1. Coronary artery disease of native artery of native heart with stable angina pectoris (HCC)   2. Hypertensive heart disease with heart failure (HCC)   3. Mixed hyperlipidemia   4. Statin intolerance   5. CKD stage 3 due to type 2 diabetes mellitus (HCC)    PLAN:    In order of problems listed above:  1. In general she is done well she is not having typical angina.  Today however she is having nonangled anginal chest pain is at high risk her EKG shows her usual left bundle branch block pattern I will draw high-sensitivity troponin for reassurance.  Clinically the majority of her complaint is neck pain is positional and my index of suspicion here is relatively low.  I gave her the option of an ED evaluation she prefers not continue her other medical therapy including oral nitrate beta-blocker calcium channel blocker.  We will draw high-sensitivity troponin is significantly elevated would need an ED evaluation.  I just received a call from [facility] her high-sensitivity troponin on their assay is mildly elevated 16 upper limit 14, although her symptoms are atypical and she has known CAD with flow-limiting stenosis in her LAD left bundle branch block and will advise her to go to med [facility] ED to check high-sensitivity troponin in our system to decide if she is acute coronary syndrome.   2. Stable BP at target for her with previous severe symptomatic orthostatic hypotension 3. Stable continue PCSK9 inhibitor she is [place] statin intolerant 4. Renal function is actually improved from baseline   Next appointment: 6 weeks   Medication Adjustments/Labs and Tests Ordered: Current medicines are reviewed at length with the patient today.  Concerns regarding medicines are  outlined above.  No orders of the defined types were placed in this encounter.  No orders of the defined types were placed in this encounter.   Chief Complaint  Patient presents with  . Follow-up  . Coronary Artery Disease    History of Present Illness:    Wendy Leon is a 86 y.o. female with a hx of CAD dyslipidemia with statin intolerance hypertension left bundle branch block and costochondral pain syndrome.  She was last seen 09/14/2019.  She was seen Monday by her PCP for left groin pain and referred to orthopedics. Compliance with diet, lifestyle and medications: Yes  Since seen by me she had COVID-19 pneumonia August required ambulatory oxygen at home was not admitted to the hospital did not receive monoclonal antibody.  She recovered from that about a month ago she started having left groin left hip pain worsened in the last few days and radiates down to her knee very painful with movement.  She has not had what we interpret is angina since her last visit but she is complaining of pain in the back of her neck that radiates into the chest is been constant for the last day.  Not pleuritic in nature she is not short of breath no palpitation or syncope.  Recent labs performed PCP [facility] 01/30/2020: CMP shows glucose 122 potassium normal 3.7 sodium normal 137 normal liver function test creatinine 0.91 GFR 58 cc is improved. Cholesterol at target 143, LDL  at target 70, triglycerides 138 HDL 54. TSH normal 2.34 Hemoglobin A1c at target 7.0 Hemoglobin 11.7 mildly decreased normocytic Past Medical History:  Diagnosis Date  . Abnormal gait 06/07/2009   Last Assessment & Plan:  She saw specialist who has given her orders for PT and she is wanting to have it setup for her at [facility] where she already is receiving care from at home  . Acid reflux 02/20/2014  . Aftercare following surgery 11/27/2015  . Anemia of unknown etiology 10/18/2016  . Anxiety 02/20/2014  . Atrial  fibrillation ([place])   . CAD (coronary artery disease)   . Cerebral ischemia 03/11/2016   Overview:  Microvascular ischemia seen on MRI 01/2015 with partial empty sella turcica neurology appt 03/12/2016 pending with [facility], also has [facility] with PT coming to the house for her gait abnormality and lumbar DDD, as well as local ortho ([place])   . Chest pain 11/03/2018  . CHF (congestive heart failure) ([place]) 06/24/2015   Last Assessment & Plan:  Relevant Hx: Course: Daily Update: Today's Plan:she has been doing well overall from her heart and no SOB  Electronically signed by: Mayer Camel, NP 06/26/15 1522  . Chronic diastolic heart failure ([place]) 06/24/2015   Last Assessment & Plan:  Relevant Hx: Course: Daily Update: Today's Plan:she has been doing well overall from her heart and no SOB  Electronically signed by: Mayer Camel, NP 06/26/15 1522  . Chronic UTI 06/27/2015   Last Assessment & Plan:  She requested a UA be checked for her as she was having more frequency than before and it has been awhile since she was on oral abx for this, she is on low dose maintenance med to prevent and has done well with that.  . CKD stage 3 due to type 2 diabetes mellitus ([place]) 02/24/2018  . Colon polyps   . COPD (chronic obstructive pulmonary disease) ([place]) 03/09/2012   Last Assessment & Plan:  She feels she is stable from this discussed with her the allergies she has and she is going to discuss with Dr. Alcide Clever having allergy injections as well for this   . Diabetes ([place]) 02/20/2014  . Dizziness 02/07/2016   Overview:  Chronic and has been having falls, MRI showing chronic microvascular ischemia, as well as partial sella turcica pending neuro appt for her with [facility]  . Dyslipidemia 07/29/2015  . Edema 06/24/2015   Last Assessment & Plan:  Relevant Hx: Course: Daily Update: Today's Plan:this is stable for her overall and will follow for her  Electronically signed by: Mayer Camel,  NP 06/27/15 0813  . Elevated cholesterol   . Essential hypertension 03/09/2012   Last Assessment & Plan:  On repeat this was stable for her and she does not check it at home much but when has been checked has been fine for her  . Fistula 02/20/2014  . Gastroesophageal reflux disease without esophagitis 02/20/2014   Last Assessment & Plan:  Relevant Hx: Course: Daily Update: Today's Plan:not on meds for this as she has had issues with her arms cramping and she is going to just drink buttermilk  Electronically signed by: Mayer Camel, NP 06/26/15 1530  . Generalized abdominal pain 10/24/2016  . Hallux valgus of left foot 12/04/2015  . Hallux valgus of right foot 12/04/2015  . High risk medication use 06/24/2015  . History of recurrent UTIs 06/15/2019  . Hx of valvular heart disease 01/29/2016  . Hypertension 02/20/2014  . Hypertensive  heart disease with heart failure ([place]) 03/09/2012   Last Assessment & Plan:  On repeat this was stable for her and she does not check it at home much but when has been checked has been fine for her  . Hypothyroidism (acquired) 03/09/2012   Last Assessment & Plan:  She was due for this to be updated and have advised her to get this drawn while here today  . IBS (irritable bowel syndrome) 06/24/2015   Last Assessment & Plan:  Relevant Hx: Course: Daily Update: Today's Plan:this is stable for her  Electronically signed by: Mayer Camel, NP 06/26/15 1531  . IBS (irritable bowel syndrome)   . Idiopathic progressive polyneuropathy 06/07/2009  . Left bundle branch block 06/24/2015  . Leukocytosis 06/24/2015   Last Assessment & Plan:  Relevant Hx: Course: Daily Update: Today's Plan:this has been stable for her with FU through hematology who felt she was having benign swings in this and she chose to not have bone marrow biopsy done   Electronically signed by: Mayer Camel, NP 06/27/15 [phone]  . 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 Ralene Cork [facility]), 8 x 6 x 3 cm. Local recurrence, re-excision 03/23/2007 Candiss Norse), 5 x 3 x 2 cm. Surveillance plan: baseline MRI April 2014  Last Assessment & Plan:  Relevant Hx: Course: Daily Update: Today's Plan:she is stable from this  Electronically signed by: Mayer Camel, NP 06/26/15 1533  . Lumbar degenerative disc disease 11/06/2015   Last Assessment & Plan:  At this point she needs updated MRI of her lumbar spine and we discussed with her gait abnormality she is likely going to have to see specialist at [facility] for this, she has seen local neuro for this several years ago in terms of her neuropathy and her falls and he really did not address it . She was advised several years ago to have back surgery locally which we did not feel was ideal with her sugars at the time being so out of control and now she is really more in control than then and it may be doable for her if felt  Needed though she would have to have cardiology clear her as well.  . Mild CAD 07/29/2015   Overview:  Overview:  Cath march 2016 with 60% LAD with normal FFR   Overview:  Cath march 2016 with 60% LAD with normal FFR   . Mixed hyperlipidemia 06/24/2015   Last Assessment & Plan:  Relevant Hx: Course: Daily Update: Today's Plan:update her lipids for her today and she is trying to watch her diet   Electronically signed by: Mayer Camel, NP 06/27/15 0813  . Obstructive sleep apnea syndrome 06/24/2015  . Onychomycosis due to dermatophyte 11/27/2015  . 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   Last Assessment & Plan:  Relevant Hx: Course: Daily Update: Today's Plan:she is doing well overall  Electronically signed by: Mayer Camel, NP 06/26/15 1531  . Pancreatitis   . Pre-ulcerative calluses 12/04/2015  . Pre-ulcerative corn or callous 11/27/2015  . Primary osteoarthritis involving multiple joints 07/06/2015   Last Assessment & Plan:   Relevant Hx: Course: Daily Update: Today's Plan:she has DDD of her back with some spinal  Stenosis that she has seen ortho locally for and he wanted to do surgery which she has avoided and I agree with, she is going to use the flexeril for this right now as she is having more muscle spasm than  what she has had in the past from her attempt to walk down some very steep steps that she was not used to . She should use her walker for support through the weekend and avoid overhead strain and lifting and stay in touch if not better.  Electronically signed by: Mayer Camel, NP 07/06/15 [phone]  . Seasonal allergic rhinitis 01/12/2019  . Sleep apnea   . Stress incontinence of urine 04/17/2016  . Syncope and collapse 10/13/2016  . Thyroid disease   . TIA (transient ischemic attack) 10/13/2016  . Type 2 diabetes mellitus without complication, without long-term current use of insulin ([place]) 03/09/2012   Last Assessment & Plan:  She brings in readings of her sugars which show she has been doing much better overall with this at home and quite pleased with her  . UTI (urinary tract infection)   . Vascular calcification 01/29/2016  . Visual changes 01/29/2016   Overview:  Following with Dr. Julio Alm as well as had recent carotid doppler showing minimal plaque present and vertebral arteries normal , nothing signficant  . Vitamin B12 deficiency 12/04/2015    Past Surgical History:  Procedure Laterality Date  . ABDOMINAL HYSTERECTOMY    . CHOLECYSTECTOMY    . EYE SURGERY    . PANCREAS SURGERY    . skin cancer lesion      Current Medications: Current Meds  Medication Sig  . albuterol (PROVENTIL) (2.5 MG/3ML) 0.083% nebulizer solution SMARTSIG:1 Via Nebulizer 4-5 Times Daily  . albuterol (VENTOLIN HFA) 108 (90 Base) MCG/ACT inhaler Inhale 2 puffs into the lungs every 4 (four) hours as needed.  . Alirocumab (PRALUENT) 150 MG/ML SOAJ Inject 150 mg into the skin every 14 (fourteen) days.  . ALPRAZolam  (XANAX) 0.5 MG tablet Take 0.5 mg by mouth 3 (three) times daily as needed for anxiety.   Marland Kitchen amLODipine (NORVASC) 5 MG tablet Take 2.5 mg by mouth daily.   Marland Kitchen amoxicillin (AMOXIL) 250 MG capsule Take 250 mg by mouth daily.  Marland Kitchen ascorbic acid (VITAMIN C) 1000 MG tablet Take 1,000 mg by mouth daily.  Marland Kitchen aspirin EC 81 MG tablet Take 81 mg by mouth daily.  . benazepril (LOTENSIN) 20 MG tablet Take 20 mg by mouth daily.  . Blood Glucose Monitoring Suppl (ACCU-CHEK AVIVA PLUS) w/Device KIT See admin instructions.  . Cholecalciferol 25 MCG (1000 UT) tablet Take 1,000 Units by mouth daily.  . cyanocobalamin (,VITAMIN B-12,) 1000 MCG/ML injection Inject into the muscle every 30 (thirty) days.  Marland Kitchen donepezil (ARICEPT) 5 MG tablet Take 5 mg by mouth at bedtime.  . fluticasone (FLONASE) 50 MCG/ACT nasal spray Place 1 spray into the nose 2 (two) times daily.  . furosemide (LASIX) 40 MG tablet TAKE 1 TABLET BY MOUTH TWICE(2) DAILY  . gabapentin (NEURONTIN) 300 MG capsule Take 300 mg by mouth daily.  Marland Kitchen glimepiride (AMARYL) 1 MG tablet Take 0.5 tablets by mouth in the morning and at bedtime.   . Insulin Glargine (LANTUS SOLOSTAR) 100 UNIT/ML Solostar Pen Inject 10 units at HS may increase to max 40 units per day based upon blood sugars  . isosorbide mononitrate (IMDUR) 30 MG 24 hr tablet Take 30 mg by mouth daily.  Marland Kitchen levothyroxine (SYNTHROID, LEVOTHROID) 75 MCG tablet Take 75 mcg by mouth daily.  . metFORMIN (GLUCOPHAGE-XR) 500 MG 24 hr tablet Take 500 mg by mouth 2 (two) times daily.  . metoprolol tartrate (LOPRESSOR) 50 MG tablet Take 50 mg by mouth 2 (two) times daily.  Marland Kitchen  Mirabegron (MYRBETRIQ PO) Take 50 mg by mouth daily.  . montelukast (SINGULAIR) 10 MG tablet Take 10 mg by mouth at bedtime.  . nitroGLYCERIN (NITROSTAT) 0.4 MG SL tablet Place 1 tablet (0.4 mg total) under the tongue every 5 (five) minutes x 3 doses as needed for chest pain.  Marland Kitchen PREMARIN vaginal cream 3 (three) times a week.  . SYMBICORT 160-4.5  MCG/ACT inhaler Inhale 2 puffs into the lungs 2 (two) times daily.     Allergies:   Doxycycline, Nitrofurantoin, and Statins   Social History   Socioeconomic History  . Marital status: Widowed    Spouse name: Not on file  . Number of children: Not on file  . Years of education: Not on file  . Highest education level: Not on file  Occupational History  . Not on file  Tobacco Use  . Smoking status: Never Smoker  . Smokeless tobacco: Current User    Types: Snuff  Vaping Use  . Vaping Use: Never used  Substance and Sexual Activity  . Alcohol use: No  . Drug use: No  . Sexual activity: Not on file  Other Topics Concern  . Not on file  Social History Narrative  . Not on file   Social Determinants of Health   Financial Resource Strain:   . Difficulty of Paying Living Expenses: Not on file  Food Insecurity:   . Worried About Charity fundraiser in the Last Year: Not on file  . Ran Out of Food in the Last Year: Not on file  Transportation Needs:   . Lack of Transportation (Medical): Not on file  . Lack of Transportation (Non-Medical): Not on file  Physical Activity:   . Days of Exercise per Week: Not on file  . Minutes of Exercise per Session: Not on file  Stress:   . Feeling of Stress : Not on file  Social Connections:   . Frequency of Communication with Friends and Family: Not on file  . Frequency of Social Gatherings with Friends and Family: Not on file  . Attends Religious Services: Not on file  . Active Member of Clubs or Organizations: Not on file  . Attends Archivist Meetings: Not on file  . Marital Status: Not on file     Family History: The patient's family history includes Cancer in her sister; Diabetes in her mother; Hypertension in her father and mother. ROS:   Please see the history of present illness.    All other systems reviewed and are negative.  EKGs/Labs/Other Studies Reviewed:    The following studies were reviewed today:  EKG:   EKG ordered today and personally reviewed.  The ekg ordered today demonstrates sinus rhythm left bundle branch block no acute ischemic changes acute ischemic changes compared to baseline  Recent Labs: 03/28/2019: ALT 14; BUN 25; Creatinine, Ser 1.20; NT-Pro BNP 343; Potassium 4.1; Sodium 137  Recent Lipid Panel    Component Value Date/Time   CHOL 208 (H) 03/28/2019 1519   TRIG 190 (H) 03/28/2019 1519   HDL 86 03/28/2019 1519   CHOLHDL 2.4 03/28/2019 1519   LDLCALC 91 03/28/2019 1519    Physical Exam:    VS:  BP (!) 151/70   Pulse 64   Ht $R'5\' 4"'aL$  (1.626 m)   Wt 160 lb 3.2 oz (72.7 kg)   SpO2 95%   BMI 27.50 kg/m     Wt Readings from Last 3 Encounters:  02/08/20 160 lb 3.2 oz (72.7 kg)  09/14/19 161 lb 3.2 oz (73.1 kg)  08/01/19 157 lb 12.8 oz (71.6 kg)     GEN: She does not appear to be uncomfortable be in distress does not look acutely ill well nourished, well developed in no acute distress HEENT: Normal NECK: No JVD; No carotid bruits LYMPHATICS: No lymphadenopathy CARDIAC: S2 paradoxical RRR, no murmurs, rubs, gallops RESPIRATORY:  Clear to auscultation without rales, wheezing or rhonchi  ABDOMEN: Soft, non-tender, non-distended MUSCULOSKELETAL:  No edema; No deformity  SKIN: Warm and dry NEUROLOGIC:  Alert and oriented x 3 PSYCHIATRIC:  Normal affect    Signed, Shirlee More, MD  02/08/2020 11:26 AM    [place]

## 2020-02-08 ENCOUNTER — Encounter (HOSPITAL_BASED_OUTPATIENT_CLINIC_OR_DEPARTMENT_OTHER): Payer: Self-pay

## 2020-02-08 ENCOUNTER — Emergency Department (HOSPITAL_BASED_OUTPATIENT_CLINIC_OR_DEPARTMENT_OTHER)
Admission: EM | Admit: 2020-02-08 | Discharge: 2020-02-08 | Disposition: A | Discharge status: Home / Self Care | Payer: Medicare Other | Attending: Emergency Medicine | Admitting: Emergency Medicine

## 2020-02-08 ENCOUNTER — Telehealth: Payer: Self-pay

## 2020-02-08 ENCOUNTER — Emergency Department (HOSPITAL_BASED_OUTPATIENT_CLINIC_OR_DEPARTMENT_OTHER): Payer: Medicare Other

## 2020-02-08 ENCOUNTER — Encounter: Payer: Self-pay | Admitting: Cardiology

## 2020-02-08 ENCOUNTER — Ambulatory Visit: Payer: Medicare Other | Admitting: Cardiology

## 2020-02-08 ENCOUNTER — Other Ambulatory Visit: Payer: Self-pay

## 2020-02-08 VITALS — BP 151/70 | HR 64 | Ht 64.0 in | Wt 160.2 lb

## 2020-02-08 DIAGNOSIS — I5032 Chronic diastolic (congestive) heart failure: Secondary | ICD-10-CM | POA: Diagnosis not present

## 2020-02-08 DIAGNOSIS — R799 Abnormal finding of blood chemistry, unspecified: Secondary | ICD-10-CM | POA: Diagnosis present

## 2020-02-08 DIAGNOSIS — Z7982 Long term (current) use of aspirin: Secondary | ICD-10-CM | POA: Diagnosis not present

## 2020-02-08 DIAGNOSIS — Z7984 Long term (current) use of oral hypoglycemic drugs: Secondary | ICD-10-CM | POA: Diagnosis not present

## 2020-02-08 DIAGNOSIS — R079 Chest pain, unspecified: Secondary | ICD-10-CM | POA: Insufficient documentation

## 2020-02-08 DIAGNOSIS — E782 Mixed hyperlipidemia: Secondary | ICD-10-CM

## 2020-02-08 DIAGNOSIS — Z789 Other specified health status: Secondary | ICD-10-CM

## 2020-02-08 DIAGNOSIS — N183 Chronic kidney disease, stage 3 unspecified: Secondary | ICD-10-CM | POA: Diagnosis not present

## 2020-02-08 DIAGNOSIS — I25118 Atherosclerotic heart disease of native coronary artery with other forms of angina pectoris: Secondary | ICD-10-CM | POA: Diagnosis not present

## 2020-02-08 DIAGNOSIS — J449 Chronic obstructive pulmonary disease, unspecified: Secondary | ICD-10-CM | POA: Insufficient documentation

## 2020-02-08 DIAGNOSIS — I130 Hypertensive heart and chronic kidney disease with heart failure and stage 1 through stage 4 chronic kidney disease, or unspecified chronic kidney disease: Secondary | ICD-10-CM | POA: Insufficient documentation

## 2020-02-08 DIAGNOSIS — I2510 Atherosclerotic heart disease of native coronary artery without angina pectoris: Secondary | ICD-10-CM | POA: Diagnosis not present

## 2020-02-08 DIAGNOSIS — Z79899 Other long term (current) drug therapy: Secondary | ICD-10-CM | POA: Insufficient documentation

## 2020-02-08 DIAGNOSIS — E119 Type 2 diabetes mellitus without complications: Secondary | ICD-10-CM | POA: Insufficient documentation

## 2020-02-08 DIAGNOSIS — Z7901 Long term (current) use of anticoagulants: Secondary | ICD-10-CM | POA: Insufficient documentation

## 2020-02-08 DIAGNOSIS — E039 Hypothyroidism, unspecified: Secondary | ICD-10-CM | POA: Insufficient documentation

## 2020-02-08 DIAGNOSIS — Z794 Long term (current) use of insulin: Secondary | ICD-10-CM | POA: Diagnosis not present

## 2020-02-08 DIAGNOSIS — I110 Hypertensive heart disease with heart failure: Secondary | ICD-10-CM

## 2020-02-08 DIAGNOSIS — E1122 Type 2 diabetes mellitus with diabetic chronic kidney disease: Secondary | ICD-10-CM

## 2020-02-08 LAB — COMPREHENSIVE METABOLIC PANEL
ALT: 15 U/L (ref 0–44)
AST: 19 U/L (ref 15–41)
Albumin: 4.1 g/dL (ref 3.5–5.0)
Alkaline Phosphatase: 41 U/L (ref 38–126)
Anion gap: 13 (ref 5–15)
BUN: 22 mg/dL (ref 8–23)
CO2: 26 mmol/L (ref 22–32)
Calcium: 9 mg/dL (ref 8.9–10.3)
Chloride: 96 mmol/L — ABNORMAL LOW (ref 98–111)
Creatinine, Ser: 1.04 mg/dL — ABNORMAL HIGH (ref 0.44–1.00)
GFR, Estimated: 49 mL/min — ABNORMAL LOW (ref >60)
Glucose, Bld: 90 mg/dL (ref 70–99)
Potassium: 3.7 mmol/L (ref 3.5–5.1)
Sodium: 135 mmol/L (ref 135–145)
Total Bilirubin: 0.4 mg/dL (ref 0.3–1.2)
Total Protein: 7.5 g/dL (ref 6.5–8.1)

## 2020-02-08 LAB — CBC WITH DIFFERENTIAL/PLATELET
Abs Immature Granulocytes: 0.04 10*3/uL (ref 0.00–0.07)
Basophils Absolute: 0.1 10*3/uL (ref 0.0–0.1)
Basophils Relative: 1 %
Eosinophils Absolute: 0.2 10*3/uL (ref 0.0–0.5)
Eosinophils Relative: 2 %
HCT: 38.7 % (ref 36.0–46.0)
Hemoglobin: 12.6 g/dL (ref 12.0–15.0)
Immature Granulocytes: 0 %
Lymphocytes Relative: 32 %
Lymphs Abs: 4.7 10*3/uL — ABNORMAL HIGH (ref 0.7–4.0)
MCH: 29.1 pg (ref 26.0–34.0)
MCHC: 32.6 g/dL (ref 30.0–36.0)
MCV: 89.4 fL (ref 80.0–100.0)
Monocytes Absolute: 1.8 10*3/uL — ABNORMAL HIGH (ref 0.1–1.0)
Monocytes Relative: 12 %
Neutro Abs: 7.8 10*3/uL — ABNORMAL HIGH (ref 1.7–7.7)
Neutrophils Relative %: 53 %
Platelets: 387 10*3/uL (ref 150–400)
RBC: 4.33 MIL/uL (ref 3.87–5.11)
RDW: 15.9 % — ABNORMAL HIGH (ref 11.5–15.5)
WBC: 14.6 10*3/uL — ABNORMAL HIGH (ref 4.0–10.5)
nRBC: 0 % (ref 0.0–0.2)

## 2020-02-08 LAB — BRAIN NATRIURETIC PEPTIDE: B Natriuretic Peptide: 76.3 pg/mL (ref 0.0–100.0)

## 2020-02-08 LAB — TROPONIN T: Troponin T (Highly Sensitive): 15 ng/L (ref 0–14)

## 2020-02-08 LAB — TROPONIN I (HIGH SENSITIVITY): Troponin I (High Sensitivity): 6 ng/L (ref <18)

## 2020-02-08 NOTE — Telephone Encounter (Signed)
Per a note from Dr. Bettina Gavia:  Re: Wendy Leon 09/03/1932, the troponin from [facility] was mildly elevated I think she should go to [facility] and have recheck with our in-house high-sensitivity troponin if abnormal we need to consider admission and treatment as acute coronary syndrome  Spoke with patient and patients daughter regarding results and recommendation.  They Verbalize understanding and is agreeable to plan of care. Advised patient to call back with any issues or concerns.

## 2020-02-08 NOTE — ED Triage Notes (Addendum)
Per daughter pt had appt with Dr Bettina Gavia cardiology this am-was called and advised elevated troponin and advised to come to ED-pt denies pain at present-c/o "chest sore" x 2 days-NAD-to triage in w/c

## 2020-02-08 NOTE — ED Provider Notes (Signed)
[facility] EMERGENCY DEPARTMENT Provider Note   CSN: [id number] Arrival date & time: 02/08/20  1606     History Chief Complaint  Patient presents with  . Abnormal Lab    Wendy Leon is a 86 y.o. female.  Patient is an 86 year old female with a history of CHF, CAD with known stenosis of the LAD but has not had stenting and is being watched closely, atrial fibrillation not on anticoagulation, chronic left bundle branch block, CKD and diabetes presenting today at the request of cardiology due to an elevated troponin.  Patient reports for the last 2 days she has had some soreness in her chest.  She reports its not as bad as the pain it is just sore and is present usually when she moves a certain way with her arms and she feels it across her chest and in her left shoulder.  She cannot recall any trauma or new activity that she is done.  It is not exertional and is the same with activity as at rest or laying down.  She has not had new shortness of breath or excessive swelling in the lower extremities.  She has had no recent medication changes.  She reports she has not taken any medication for the discomfort because it is not that bad.  When she saw her cardiologist today her EKG showed her ongoing left bundle branch block with no new changes.  They checked her troponin for reassurance and it came back today at 16 with the high normal being 14.  The last time patient had troponin checked it was 11.  These are high-sensitivity troponins.  They recommended she come here for further evaluation.  Patient denies any abdominal pain, nausea or vomiting.  She did have Covid in August reports since that time she has had some ongoing coughing but otherwise feels at her baseline.  The history is provided by the patient and a relative.  Abnormal Lab Time since result:  Today Patient referred by:  Specialist Resulting agency:  Internal Result type: cardiac   Cardiac:    Troponin:  High       Past Medical History:  Diagnosis Date  . Abnormal gait 06/07/2009   Last Assessment & Plan:  She saw specialist who has given her orders for PT and she is wanting to have it setup for her at [facility] where she already is receiving care from at home  . Acid reflux 02/20/2014  . Aftercare following surgery 11/27/2015  . Anemia of unknown etiology 10/18/2016  . Anxiety 02/20/2014  . Atrial fibrillation ([place])   . CAD (coronary artery disease)   . Cerebral ischemia 03/11/2016   Overview:  Microvascular ischemia seen on MRI 01/2015 with partial empty sella turcica neurology appt 03/12/2016 pending with [facility], also has [facility] with PT coming to the house for her gait abnormality and lumbar DDD, as well as local ortho ([place])   . Chest pain 11/03/2018  . CHF (congestive heart failure) ([place]) 06/24/2015   Last Assessment & Plan:  Relevant Hx: Course: Daily Update: Today's Plan:she has been doing well overall from her heart and no SOB  Electronically signed by: Mayer Camel, NP 06/26/15 1522  . Chronic diastolic heart failure ([place]) 06/24/2015   Last Assessment & Plan:  Relevant Hx: Course: Daily Update: Today's Plan:she has been doing well overall from her heart and no SOB  Electronically signed by: Mayer Camel, NP 06/26/15 1522  . Chronic UTI 06/27/2015  Last Assessment & Plan:  She requested a UA be checked for her as she was having more frequency than before and it has been awhile since she was on oral abx for this, she is on low dose maintenance med to prevent and has done well with that.  . CKD stage 3 due to type 2 diabetes mellitus ([place]) 02/24/2018  . Colon polyps   . COPD (chronic obstructive pulmonary disease) ([place]) 03/09/2012   Last Assessment & Plan:  She feels she is stable from this discussed with her the allergies she has and she is going to discuss with Dr. Alcide Clever having allergy injections as well for this   . Diabetes ([place]) 02/20/2014  . Dizziness  02/07/2016   Overview:  Chronic and has been having falls, MRI showing chronic microvascular ischemia, as well as partial sella turcica pending neuro appt for her with [facility]  . Dyslipidemia 07/29/2015  . Edema 06/24/2015   Last Assessment & Plan:  Relevant Hx: Course: Daily Update: Today's Plan:this is stable for her overall and will follow for her  Electronically signed by: Mayer Camel, NP 06/27/15 0813  . Elevated cholesterol   . Essential hypertension 03/09/2012   Last Assessment & Plan:  On repeat this was stable for her and she does not check it at home much but when has been checked has been fine for her  . Fistula 02/20/2014  . Gastroesophageal reflux disease without esophagitis 02/20/2014   Last Assessment & Plan:  Relevant Hx: Course: Daily Update: Today's Plan:not on meds for this as she has had issues with her arms cramping and she is going to just drink buttermilk  Electronically signed by: Mayer Camel, NP 06/26/15 1530  . Generalized abdominal pain 10/24/2016  . Hallux valgus of left foot 12/04/2015  . Hallux valgus of right foot 12/04/2015  . High risk medication use 06/24/2015  . History of recurrent UTIs 06/15/2019  . Hx of valvular heart disease 01/29/2016  . Hypertension 02/20/2014  . Hypertensive heart disease with heart failure ([place]) 03/09/2012   Last Assessment & Plan:  On repeat this was stable for her and she does not check it at home much but when has been checked has been fine for her  . Hypothyroidism (acquired) 03/09/2012   Last Assessment & Plan:  She was due for this to be updated and have advised her to get this drawn while here today  . IBS (irritable bowel syndrome) 06/24/2015   Last Assessment & Plan:  Relevant Hx: Course: Daily Update: Today's Plan:this is stable for her  Electronically signed by: Mayer Camel, NP 06/26/15 1531  . IBS (irritable bowel syndrome)   . Idiopathic progressive polyneuropathy 06/07/2009  . Left bundle  branch block 06/24/2015  . Leukocytosis 06/24/2015   Last Assessment & Plan:  Relevant Hx: Course: Daily Update: Today's Plan:this has been stable for her with FU through hematology who felt she was having benign swings in this and she chose to not have bone marrow biopsy done   Electronically signed by: Mayer Camel, NP 06/27/15 [phone]  . Liposarcoma, well differentiated type ([place]) 03/09/2012   Overview:  Initial excision 03/18/2000 (Dr. Ralene Cork [facility]), 8 x 6 x 3 cm. Local recurrence, re-excision 03/23/2007 Candiss Norse), 5 x 3 x 2 cm. Surveillance plan: baseline MRI April 2014  Last Assessment & Plan:  Relevant Hx: Course: Daily Update: Today's Plan:she is stable from this  Electronically signed by: Mayer Camel, NP 06/26/15 1533  .  Lumbar degenerative disc disease 11/06/2015   Last Assessment & Plan:  At this point she needs updated MRI of her lumbar spine and we discussed with her gait abnormality she is likely going to have to see specialist at [facility] for this, she has seen local neuro for this several years ago in terms of her neuropathy and her falls and he really did not address it . She was advised several years ago to have back surgery locally which we did not feel was ideal with her sugars at the time being so out of control and now she is really more in control than then and it may be doable for her if felt  Needed though she would have to have cardiology clear her as well.  . Mild CAD 07/29/2015   Overview:  Overview:  Cath march 2016 with 60% LAD with normal FFR   Overview:  Cath march 2016 with 60% LAD with normal FFR   . Mixed hyperlipidemia 06/24/2015   Last Assessment & Plan:  Relevant Hx: Course: Daily Update: Today's Plan:update her lipids for her today and she is trying to watch her diet   Electronically signed by: Mayer Camel, NP 06/27/15 0813  . Obstructive sleep apnea syndrome 06/24/2015  . Onychomycosis due to dermatophyte 11/27/2015    . 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   Last Assessment & Plan:  Relevant Hx: Course: Daily Update: Today's Plan:she is doing well overall  Electronically signed by: Mayer Camel, NP 06/26/15 1531  . Pancreatitis   . Pre-ulcerative calluses 12/04/2015  . Pre-ulcerative corn or callous 11/27/2015  . Primary osteoarthritis involving multiple joints 07/06/2015   Last Assessment & Plan:  Relevant Hx: Course: Daily Update: Today's Plan:she has DDD of her back with some spinal  Stenosis that she has seen ortho locally for and he wanted to do surgery which she has avoided and I agree with, she is going to use the flexeril for this right now as she is having more muscle spasm than what she has had in the past from her attempt to walk down some very steep steps that she was not used to . She should use her walker for support through the weekend and avoid overhead strain and lifting and stay in touch if not better.  Electronically signed by: Mayer Camel, NP 07/06/15 [phone]  . Seasonal allergic rhinitis 01/12/2019  . Sleep apnea   . Stress incontinence of urine 04/17/2016  . Syncope and collapse 10/13/2016  . Thyroid disease   . TIA (transient ischemic attack) 10/13/2016  . Type 2 diabetes mellitus without complication, without long-term current use of insulin ([place]) 03/09/2012   Last Assessment & Plan:  She brings in readings of her sugars which show she has been doing much better overall with this at home and quite pleased with her  . UTI (urinary tract infection)   . Vascular calcification 01/29/2016  . Visual changes 01/29/2016   Overview:  Following with Dr. Julio Alm as well as had recent carotid doppler showing minimal plaque present and vertebral arteries normal , nothing signficant  . Vitamin B12 deficiency 12/04/2015    Patient Active Problem List   Diagnosis Date Noted  . UTI (urinary tract infection)   . Thyroid disease   . Sleep apnea   . Pancreatitis   . Elevated cholesterol   .  Colon polyps   . CAD (coronary artery disease)   . Atrial fibrillation ([place])   . History of recurrent UTIs 06/15/2019  .  Seasonal allergic rhinitis 01/12/2019  . Chest pain 11/03/2018  . CKD stage 3 due to type 2 diabetes mellitus ([place]) 02/24/2018  . Generalized abdominal pain 10/24/2016  . Anemia of unknown etiology 10/18/2016  . Syncope and collapse 10/13/2016  . TIA (transient ischemic attack) 10/13/2016  . Stress incontinence of urine 04/17/2016  . Cerebral ischemia 03/11/2016  . Dizziness 02/07/2016  . Hx of valvular heart disease 01/29/2016  . Vascular calcification 01/29/2016  . Visual changes 01/29/2016  . Hallux valgus of left foot 12/04/2015  . Hallux valgus of right foot 12/04/2015  . Pre-ulcerative calluses 12/04/2015  . Vitamin B12 deficiency 12/04/2015  . Aftercare following surgery 11/27/2015  . Onychomycosis due to dermatophyte 11/27/2015  . Pre-ulcerative corn or callous 11/27/2015  . Lumbar degenerative disc disease 11/06/2015  . Dyslipidemia 07/29/2015  . Mild CAD 07/29/2015  . Primary osteoarthritis involving multiple joints 07/06/2015  . Chronic UTI 06/27/2015  . Chronic diastolic heart failure ([employer]) 06/24/2015  . Edema 06/24/2015  . High risk medication use 06/24/2015  . IBS (irritable bowel syndrome) 06/24/2015  . Left bundle branch block 06/24/2015  . Leukocytosis 06/24/2015  . Mixed hyperlipidemia 06/24/2015  . Obstructive sleep apnea syndrome 06/24/2015  . 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  . CHF (congestive heart failure) ([place]) 06/24/2015  . Acid reflux 02/20/2014  . Anxiety 02/20/2014  . Diabetes ([place]) 02/20/2014  . Gastroesophageal reflux disease without esophagitis 02/20/2014  . Fistula 02/20/2014  . Hypertension 02/20/2014  . COPD (chronic obstructive pulmonary disease) ([place]) 03/09/2012  . Hypertensive heart disease with heart failure ([place]) 03/09/2012  . Hypothyroidism (acquired) 03/09/2012  . Liposarcoma, well differentiated type ([place])  03/09/2012  . Type 2 diabetes mellitus without complication, without long-term current use of insulin ([place]) 03/09/2012  . Essential hypertension 03/09/2012  . Abnormal gait 06/07/2009  . Idiopathic progressive polyneuropathy 06/07/2009    Past Surgical History:  Procedure Laterality Date  . ABDOMINAL HYSTERECTOMY    . CHOLECYSTECTOMY    . EYE SURGERY    . PANCREAS SURGERY    . skin cancer lesion       OB History   No obstetric history on file.     Family History  Problem Relation Age of Onset  . Diabetes Mother   . Hypertension Mother   . Cancer Sister   . Hypertension Father     Social History   Tobacco Use  . Smoking status: Never Smoker  . Smokeless tobacco: Current User    Types: Snuff  Vaping Use  . Vaping Use: Never used  Substance Use Topics  . Alcohol use: No  . Drug use: No    Home Medications Prior to Admission medications   Medication Sig Start Date End Date Taking? Authorizing Provider  albuterol (PROVENTIL) (2.5 MG/3ML) 0.083% nebulizer solution SMARTSIG:1 Via Nebulizer 4-5 Times Daily 02/03/20   [provider]  albuterol (VENTOLIN HFA) 108 (90 Base) MCG/ACT inhaler Inhale 2 puffs into the lungs every 4 (four) hours as needed. 12/30/19   [provider]  Alirocumab (PRALUENT) 150 MG/ML SOAJ Inject 150 mg into the skin every 14 (fourteen) days. 09/14/19   Richardo Priest, MD  ALPRAZolam Duanne Moron) 0.5 MG tablet Take 0.5 mg by mouth 3 (three) times daily as needed for anxiety.  10/13/16   [provider]  amLODipine (NORVASC) 5 MG tablet Take 2.5 mg by mouth daily.     [provider]  amoxicillin (AMOXIL) 250 MG capsule Take 250 mg by mouth daily.    [provider]  ascorbic acid (VITAMIN C) 1000 MG tablet Take 1,000 mg by mouth daily.    [provider]  aspirin EC 81 MG tablet Take 81 mg by mouth daily.    [provider]  benazepril (LOTENSIN) 20 MG tablet Take 20 mg by mouth daily. 05/26/17    [provider]  Blood Glucose Monitoring Suppl (ACCU-CHEK AVIVA PLUS) w/Device KIT See admin instructions. 10/07/16   [provider]  Cholecalciferol 25 MCG (1000 UT) tablet Take 1,000 Units by mouth daily.    [provider]  Cranberry (RA CRANBERRY) 500 MG CAPS Take 1 Can by mouth 2 (two) times daily. Patient not taking: Reported on 02/08/2020    [provider]  cyanocobalamin (,VITAMIN B-12,) 1000 MCG/ML injection Inject into the muscle every 30 (thirty) days. 03/08/19   [provider]  donepezil (ARICEPT) 5 MG tablet Take 5 mg by mouth at bedtime. 11/14/16   [provider]  fluticasone (FLONASE) 50 MCG/ACT nasal spray Place 1 spray into the nose 2 (two) times daily. 01/29/12   [provider]  furosemide (LASIX) 40 MG tablet TAKE 1 TABLET BY MOUTH TWICE(2) DAILY 01/24/20   Richardo Priest, MD  gabapentin (NEURONTIN) 300 MG capsule Take 300 mg by mouth daily. 11/14/16   [provider]  glimepiride (AMARYL) 1 MG tablet Take 0.5 tablets by mouth in the morning and at bedtime.  11/13/16   [provider]  Insulin Glargine (LANTUS SOLOSTAR) 100 UNIT/ML Solostar Pen Inject 10 units at HS may increase to max 40 units per day based upon blood sugars 05/21/18   [provider]  isosorbide mononitrate (IMDUR) 30 MG 24 hr tablet Take 1 tablet (30 mg total) by mouth daily. 08/01/19 10/30/19  Richardo Priest, MD  isosorbide mononitrate (IMDUR) 30 MG 24 hr tablet Take 30 mg by mouth daily. 08/01/19   [provider]  levothyroxine (SYNTHROID, LEVOTHROID) 75 MCG tablet Take 75 mcg by mouth daily. 11/14/16   [provider]  metFORMIN (GLUCOPHAGE-XR) 500 MG 24 hr tablet Take 500 mg by mouth 2 (two) times daily. 11/14/16   [provider]  metoprolol tartrate (LOPRESSOR) 50 MG tablet Take 50 mg by mouth 2 (two) times daily.    [provider]  Mirabegron (MYRBETRIQ PO) Take 50 mg by mouth daily.     [provider]  montelukast (SINGULAIR) 10 MG tablet Take 10 mg by mouth at bedtime. 11/17/16   [provider]  nitroGLYCERIN (NITROSTAT) 0.4 MG SL tablet Place 1 tablet (0.4 mg total) under the tongue every 5 (five) minutes x 3 doses as needed for chest pain. 07/28/18   Richardo Priest, MD  PREMARIN vaginal cream 3 (three) times a week. 01/31/20   [provider]  SYMBICORT 160-4.5 MCG/ACT inhaler Inhale 2 puffs into the lungs 2 (two) times daily. 11/17/16   [provider]    Allergies    Doxycycline, Nitrofurantoin, and Statins  Review of Systems   Review of Systems  All other systems reviewed and are negative.   Physical Exam Updated Vital Signs BP (!) 192/72 (BP Location: Right Arm)   Pulse 65   Temp 98.8 F (37.1 C) (Oral)   Resp 20   SpO2 98%   Physical Exam Vitals and nursing note reviewed.  Constitutional:      General: She is not in acute distress.    Appearance: She is well-developed and normal weight.  HENT:  Head: Normocephalic and atraumatic.     Mouth/Throat:     Mouth: Mucous membranes are moist.  Eyes:     Pupils: Pupils are equal, round, and reactive to light.  Cardiovascular:     Rate and Rhythm: Normal rate and regular rhythm.     Pulses: Normal pulses.     Heart sounds: Normal heart sounds. No murmur heard.  No friction rub.  Pulmonary:     Effort: Pulmonary effort is normal.     Breath sounds: Normal breath sounds. No wheezing or rales.     Comments: Pain is not reproducible with palpation of the chest or the left shoulder however when she moves her arm she can reproduce the pain Chest:     Chest wall: No tenderness.  Abdominal:     General: Bowel sounds are normal. There is no distension.     Palpations: Abdomen is soft.     Tenderness: There is no abdominal tenderness. There is no guarding or rebound.  Musculoskeletal:        General: No tenderness. Normal range of motion.     Right lower leg: Edema  present.     Left lower leg: Edema present.     Comments: Trace edema bilaterally  Skin:    General: Skin is warm and dry.     Findings: No rash.  Neurological:     General: No focal deficit present.     Mental Status: She is alert and oriented to person, place, and time. Mental status is at baseline.     Cranial Nerves: No cranial nerve deficit.  Psychiatric:        Mood and Affect: Mood normal.        Behavior: Behavior normal.        Thought Content: Thought content normal.     ED Results / Procedures / Treatments   Labs (all labs ordered are listed, but only abnormal results are displayed) Labs Reviewed  CBC WITH DIFFERENTIAL/PLATELET - Abnormal; Notable for the following components:      Result Value   WBC 14.6 (*)    RDW 15.9 (*)    Neutro Abs 7.8 (*)    Lymphs Abs 4.7 (*)    Monocytes Absolute 1.8 (*)    All other components within normal limits  COMPREHENSIVE METABOLIC PANEL - Abnormal; Notable for the following components:   Chloride 96 (*)    Creatinine, Ser 1.04 (*)    GFR, Estimated 49 (*)    All other components within normal limits  BRAIN NATRIURETIC PEPTIDE  TROPONIN I (HIGH SENSITIVITY)  TROPONIN I (HIGH SENSITIVITY)    EKG EKG Interpretation  Date/Time:  Wednesday February 08 2020 16:15:00 EDT Ventricular Rate:  59 PR Interval:  168 QRS Duration: 124 QT Interval:  456 QTC Calculation: 451 R Axis:   82 Text Interpretation: Sinus bradycardia Left bundle branch block No significant change since last tracing Confirmed by Blanchie Dessert [phone]) on 02/08/2020 4:24:28 PM   Radiology DG Chest 2 View  Result Date: 02/08/2020 CLINICAL DATA:  86 year old female with chest pain. EXAM: CHEST - 2 VIEW COMPARISON:  None. FINDINGS: There are biapical subpleural scarring and reticulation. Bilateral upper lobe/biapical streaky and nodular densities similar to the prior radiograph, chronic. No new consolidation. There is no pleural effusion or pneumothorax. The  cardiac silhouette is within limits. Atherosclerotic calcification of the aorta. No acute osseous pathology. IMPRESSION: No acute cardiopulmonary process. Electronically Signed   By: Anner Crete M.D.   On:  02/08/2020 17:38    Procedures Procedures (including critical care time)  Medications Ordered in ED Medications - No data to display  ED Course  I have reviewed the triage vital signs and the nursing notes.  Pertinent labs & imaging results that were available during my care of the patient were reviewed by me and considered in my medical decision making (see chart for details).    MDM Rules/Calculators/A&P                          Patient with extensive cardiac history who saw her cardiologist earlier today and is presenting with chest pain.  The patient's chest discomfort is nonspecific and atypical.  It is not exertional and made worse with moving her arms a certain way.  She is not having associated symptoms.  Her EKG here shows ongoing left bundle branch block which looks similar to the EKG done in the office earlier today.  Based on cardiology notes they wanted her to have a repeat troponin here.  High-sensitivity troponin she had drawn by lab core was 16 with a high normal being 14.  CBC, CMP, BNP, troponin and chest x-ray are pending.  Patient's vital signs are reassuring except for hypertension with a blood pressure of 182/72.  She otherwise is comfortable in the room and in no acute distress.  6:13 PM Patient's troponin here is normal at 6, CMP with no acute changes creatinine stable at 1.04.  CBC with leukocytosis of 14,000 however when speaking with the patient and her daughter this is common for her because she is asplenic and always runs a higher white count and she sees a hematologist for this.  Patient has no infectious symptoms at this time.  Chest x-ray is within normal limits.  And BNP is normal.  Will discharge patient home to follow-up with Dr. Bettina Gavia in an outpatient  setting.  Feel patient's chest pain today is atypical and noncardiac.  MDM Number of Diagnoses or Management Options   Amount and/or Complexity of Data Reviewed Clinical lab tests: reviewed and ordered Tests in the radiology section of CPT: reviewed and ordered Tests in the medicine section of CPT: reviewed and ordered Decide to obtain previous medical records or to obtain history from someone other than the patient: yes Obtain history from someone other than the patient: yes Review and summarize past medical records: yes Discuss the patient with other providers: no Independent visualization of images, tracings, or specimens: yes  Risk of Complications, Morbidity, and/or Mortality Presenting problems: moderate Diagnostic procedures: minimal Management options: minimal  Patient Progress Patient progress: stable   Final Clinical Impression(s) / ED Diagnoses Final diagnoses:  Nonspecific chest pain    Rx / DC Orders ED Discharge Orders    None       Blanchie Dessert, MD 02/08/20 1818

## 2020-02-08 NOTE — Patient Instructions (Signed)
Medication Instructions:  Your physician recommends that you continue on your current medications as directed. Please refer to the Current Medication list given to you today.  *If you need a refill on your cardiac medications before your next appointment, please call your pharmacy*   Lab Work: Your physician recommends that you return for lab work in: TODAY Troponin If you have labs (blood work) drawn today and your tests are completely normal, you will receive your results only by: Marland Kitchen MyChart Message (if you have MyChart) OR . A paper copy in the mail If you have any lab test that is abnormal or we need to change your treatment, we will call you to review the results.   Testing/Procedures: None   Follow-Up: At [facility], you and your health needs are our priority.  As part of our continuing mission to provide you with exceptional heart care, we have created designated Provider Care Teams.  These Care Teams include your primary Cardiologist (physician) and Advanced Practice Providers (APPs -  Physician Assistants and Nurse Practitioners) who all work together to provide you with the care you need, when you need it.  We recommend signing up for the patient portal called "MyChart".  Sign up information is provided on this After Visit Summary.  MyChart is used to connect with patients for Virtual Visits (Telemedicine).  Patients are able to view lab/test results, encounter notes, upcoming appointments, etc.  Non-urgent messages can be sent to your provider as well.   To learn more about what you can do with MyChart, go to [email or web address].    Your next appointment:   6 week(s)  The format for your next appointment:   In Person  Provider:   Shirlee More, MD   Other Instructions

## 2020-02-10 ENCOUNTER — Other Ambulatory Visit: Payer: Self-pay | Admitting: Hematology and Oncology

## 2020-02-10 DIAGNOSIS — D509 Iron deficiency anemia, unspecified: Secondary | ICD-10-CM

## 2020-02-10 LAB — HEPATIC FUNCTION PANEL
ALT: 17 (ref 7–35)
AST: 24 (ref 13–35)
Alkaline Phosphatase: 57 (ref 25–125)
Bilirubin, Total: 0.4

## 2020-02-10 LAB — BASIC METABOLIC PANEL
BUN: 28 — AB (ref 4–21)
CO2: 28 — AB (ref 13–22)
Chloride: 93 — AB (ref 99–108)
Creatinine: 1.2 — AB (ref 0.5–1.1)
Glucose: 87
Potassium: 4.5 (ref 3.4–5.3)
Sodium: 134 — AB (ref 137–147)

## 2020-02-10 LAB — CBC AND DIFFERENTIAL
HCT: 35 — AB (ref 36–46)
Hemoglobin: 12.1 (ref 12.0–16.0)
Platelets: 341 (ref 150–399)
WBC: 13

## 2020-02-10 LAB — COMPREHENSIVE METABOLIC PANEL
Albumin: 4.7 (ref 3.5–5.0)
Calcium: 9.9 (ref 8.7–10.7)

## 2020-02-10 LAB — CBC: RBC: 4.01 (ref 3.87–5.11)

## 2020-03-20 DIAGNOSIS — M1612 Unilateral primary osteoarthritis, left hip: Secondary | ICD-10-CM | POA: Insufficient documentation

## 2020-03-20 HISTORY — DX: Unilateral primary osteoarthritis, left hip: M16.12

## 2020-03-21 ENCOUNTER — Other Ambulatory Visit: Payer: Self-pay

## 2020-03-21 ENCOUNTER — Encounter: Payer: Self-pay | Admitting: Cardiology

## 2020-03-21 ENCOUNTER — Ambulatory Visit: Payer: Medicare Other | Admitting: Cardiology

## 2020-03-21 VITALS — BP 124/71 | HR 60 | Ht 64.0 in | Wt 161.0 lb

## 2020-03-21 DIAGNOSIS — E782 Mixed hyperlipidemia: Secondary | ICD-10-CM

## 2020-03-21 DIAGNOSIS — E1122 Type 2 diabetes mellitus with diabetic chronic kidney disease: Secondary | ICD-10-CM

## 2020-03-21 DIAGNOSIS — Z789 Other specified health status: Secondary | ICD-10-CM

## 2020-03-21 DIAGNOSIS — I110 Hypertensive heart disease with heart failure: Secondary | ICD-10-CM

## 2020-03-21 DIAGNOSIS — N183 Chronic kidney disease, stage 3 unspecified: Secondary | ICD-10-CM

## 2020-03-21 DIAGNOSIS — I25118 Atherosclerotic heart disease of native coronary artery with other forms of angina pectoris: Secondary | ICD-10-CM | POA: Diagnosis not present

## 2020-03-21 NOTE — Progress Notes (Signed)
Cardiology Office Note:    Date:  03/21/2020   ID:  Wendy Leon, DOB [birth year], MRN [id number]  PCP:  Wendy Mina., MD  Cardiologist:  Wendy More, MD    Referring MD: Wendy Mina., MD    ASSESSMENT:    1. Coronary artery disease of native artery of native heart with stable angina pectoris ([place])   2. Hypertensive heart disease with heart failure ([place])   3. Mixed hyperlipidemia   4. Statin intolerance   5. CKD stage 3 due to type 2 diabetes mellitus ([place])    PLAN:    In order of problems listed above:  1. She is doing quite well is having no angina on current medical therapy has not needed nitroglycerin continue aspirin low-dose oral nitrate and lipid-lowering treatment with Praluent PCSK9 she is statin intolerant 2. Labile BP at target continue treatment including ACE inhibitor low-dose diuretic heart failure compensated 3. Excellent response continue her PCSK9 inhibitor 4. Stable CKD   Next appointment: 6 months   Medication Adjustments/Labs and Tests Ordered: Current medicines are reviewed at length with the patient today.  Concerns regarding medicines are outlined above.  No orders of the defined types were placed in this encounter.  No orders of the defined types were placed in this encounter.   Chief Complaint  Patient presents with  . Follow-up  . Coronary Artery Disease    History of Present Illness:    Wendy Leon is a 86 y.o. female with a hx of CAD dyslipidemia with statin intolerance hypertension left bundle branch block and costochondral pain syndrome  last seen 02/08/2019.  CTA showed 60% LAD proximal stenosis reduced FFR medically her choice and with severe comorbidities.  She was seen with her PCP yesterday for ongoing pain left hip osteoarthritis MRI shows severely degenerative labrum and there is a tear.  Is been some discussion about hip replacement.  Compliance with diet, lifestyle and medications: She is not doing well best were to she  is in agony and requires narcotics and cannot ambulate because of hip pain.  Her family relates she is seeing a specialist at [facility] for consideration of intervention.  Although she is a high risk patient she is stable is had no recent angina and I think if she is required for her to have surgery in my opinion she is optimized and I would advise her to proceed.  She has simply stop her aspirin 1 week prior to surgery go to a monitored bed postoperatively an EKG postoperative day 1.  Most recent labs [facility] PCP 01/30/2020: A1c 7.0% CMP potassium 3.7 creatinine 0.91 normal liver function test GFR 58 cc Cholesterol 143 LDL direct 70 triglycerides 138 HDL 54 non-HDL cholesterol 89 Hemoglobin 11.2 normal MCV platelets 337,000 Past Medical History:  Diagnosis Date  . Abnormal gait 06/07/2009   Last Assessment & Plan:  She saw specialist who has given her orders for PT and she is wanting to have it setup for her at [facility] where she already is receiving care from at home  . Acid reflux 02/20/2014  . Aftercare following surgery 11/27/2015  . Anemia of unknown etiology 10/18/2016  . Anxiety 02/20/2014  . Atrial fibrillation ([place])   . CAD (coronary artery disease)   . Cerebral ischemia 03/11/2016   Overview:  Microvascular ischemia seen on MRI 01/2015 with partial empty sella turcica neurology appt 03/12/2016 pending with [facility], also has [facility] with [facility] coming to the  house for her gait abnormality and lumbar DDD, as well as local ortho ([place])   . Chest pain 11/03/2018  . CHF (congestive heart failure) ([place]) 06/24/2015   Last Assessment & Plan:  Relevant Hx: Course: Daily Update: Today's Plan:she has been doing well overall from her heart and no SOB  Electronically signed by: Mayer Camel, NP 06/26/15 1522  . Chronic diastolic heart failure ([place]) 06/24/2015   Last Assessment & Plan:  Relevant Hx: Course: Daily Update: Today's Plan:she has been doing well  overall from her heart and no SOB  Electronically signed by: Mayer Camel, NP 06/26/15 1522  . Chronic UTI 06/27/2015   Last Assessment & Plan:  She requested a UA be checked for her as she was having Leon frequency than before and it has been awhile since she was on oral abx for this, she is on low dose maintenance med to prevent and has done well with that.  . CKD stage 3 due to type 2 diabetes mellitus ([place]) 02/24/2018  . Colon polyps   . COPD (chronic obstructive pulmonary disease) ([place]) 03/09/2012   Last Assessment & Plan:  She feels she is stable from this discussed with her the allergies she has and she is going to discuss with Dr. Alcide Clever having allergy injections as well for this   . Diabetes ([place]) 02/20/2014  . Dizziness 02/07/2016   Overview:  Chronic and has been having falls, MRI showing chronic microvascular ischemia, as well as partial sella turcica pending neuro appt for her with [facility]  . Dyslipidemia 07/29/2015  . Edema 06/24/2015   Last Assessment & Plan:  Relevant Hx: Course: Daily Update: Today's Plan:this is stable for her overall and will follow for her  Electronically signed by: Mayer Camel, NP 06/27/15 0813  . Elevated cholesterol   . Essential hypertension 03/09/2012   Last Assessment & Plan:  On repeat this was stable for her and she does not check it at home much but when has been checked has been fine for her  . Fistula 02/20/2014  . Gastroesophageal reflux disease without esophagitis 02/20/2014   Last Assessment & Plan:  Relevant Hx: Course: Daily Update: Today's Plan:not on meds for this as she has had issues with her arms cramping and she is going to just drink buttermilk  Electronically signed by: Mayer Camel, NP 06/26/15 1530  . Generalized abdominal pain 10/24/2016  . Hallux valgus of left foot 12/04/2015  . Hallux valgus of right foot 12/04/2015  . High risk medication use 06/24/2015  . History of recurrent UTIs 06/15/2019  . Hx  of valvular heart disease 01/29/2016  . Hypertension 02/20/2014  . Hypertensive heart disease with heart failure ([place]) 03/09/2012   Last Assessment & Plan:  On repeat this was stable for her and she does not check it at home much but when has been checked has been fine for her  . Hypothyroidism (acquired) 03/09/2012   Last Assessment & Plan:  She was due for this to be updated and have advised her to get this drawn while here today  . IBS (irritable bowel syndrome) 06/24/2015   Last Assessment & Plan:  Relevant Hx: Course: Daily Update: Today's Plan:this is stable for her  Electronically signed by: Mayer Camel, NP 06/26/15 1531  . IBS (irritable bowel syndrome)   . Idiopathic progressive polyneuropathy 06/07/2009  . Left bundle branch block 06/24/2015  . Leukocytosis 06/24/2015   Last Assessment & Plan:  Relevant Hx: Course: Daily  Update: Today's Plan:this has been stable for her with FU through hematology who felt she was having benign swings in this and she chose to not have bone marrow biopsy done   Electronically signed by: Mayer Camel, NP 06/27/15 [phone]  . Liposarcoma, well differentiated type ([place]) 03/09/2012   Overview:  Initial excision 03/18/2000 (Dr. Ralene Cork [facility]), 8 x 6 x 3 cm. Local recurrence, re-excision 03/23/2007 Candiss Norse), 5 x 3 x 2 cm. Surveillance plan: baseline MRI April 2014  Last Assessment & Plan:  Relevant Hx: Course: Daily Update: Today's Plan:she is stable from this  Electronically signed by: Mayer Camel, NP 06/26/15 1533  . Lumbar degenerative disc disease 11/06/2015   Last Assessment & Plan:  At this point she needs updated MRI of her lumbar spine and we discussed with her gait abnormality she is likely going to have to see specialist at [facility] for this, she has seen local neuro for this several years ago in terms of her neuropathy and her falls and he really did not address it . She was advised several years ago to  have back surgery locally which we did not feel was ideal with her sugars at the time being so out of control and now she is really Leon in control than then and it may be doable for her if felt  Needed though she would have to have cardiology clear her as well.  . Mild CAD 07/29/2015   Overview:  Overview:  Cath march 2016 with 60% LAD with normal FFR   Overview:  Cath march 2016 with 60% LAD with normal FFR   . Mixed hyperlipidemia 06/24/2015   Last Assessment & Plan:  Relevant Hx: Course: Daily Update: Today's Plan:update her lipids for her today and she is trying to watch her diet   Electronically signed by: Mayer Camel, NP 06/27/15 0813  . Obstructive sleep apnea syndrome 06/24/2015  . Onychomycosis due to dermatophyte 11/27/2015  . 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   Last Assessment & Plan:  Relevant Hx: Course: Daily Update: Today's Plan:she is doing well overall  Electronically signed by: Mayer Camel, NP 06/26/15 1531  . Pancreatitis   . Pre-ulcerative calluses 12/04/2015  . Pre-ulcerative corn or callous 11/27/2015  . Primary osteoarthritis involving multiple joints 07/06/2015   Last Assessment & Plan:  Relevant Hx: Course: Daily Update: Today's Plan:she has DDD of her back with some spinal  Stenosis that she has seen ortho locally for and he wanted to do surgery which she has avoided and I agree with, she is going to use the flexeril for this right now as she is having Leon muscle spasm than what she has had in the past from her attempt to walk down some very steep steps that she was not used to . She should use her walker for support through the weekend and avoid overhead strain and lifting and stay in touch if not better.  Electronically signed by: Mayer Camel, NP 07/06/15 [phone]  . Seasonal allergic rhinitis 01/12/2019  . Sleep apnea   . Stress incontinence of urine 04/17/2016  . Syncope and collapse 10/13/2016  . Thyroid disease   . TIA (transient ischemic  attack) 10/13/2016  . Type 2 diabetes mellitus without complication, without long-term current use of insulin ([place]) 03/09/2012   Last Assessment & Plan:  She brings in readings of her sugars which show she has been doing much better overall with this at home and quite pleased with her  .  UTI (urinary tract infection)   . Vascular calcification 01/29/2016  . Visual changes 01/29/2016   Overview:  Following with Dr. Julio Alm as well as had recent carotid doppler showing minimal plaque present and vertebral arteries normal , nothing signficant  . Vitamin B12 deficiency 12/04/2015    Past Surgical History:  Procedure Laterality Date  . ABDOMINAL HYSTERECTOMY    . CHOLECYSTECTOMY    . EYE SURGERY    . PANCREAS SURGERY    . skin cancer lesion      Current Medications: Current Meds  Medication Sig  . albuterol (PROVENTIL) (2.5 MG/3ML) 0.083% nebulizer solution SMARTSIG:1 Via Nebulizer 4-5 Times Daily  . albuterol (VENTOLIN HFA) 108 (90 Base) MCG/ACT inhaler Inhale 2 puffs into the lungs every 4 (four) hours as needed.  . Alirocumab (PRALUENT) 150 MG/ML SOAJ Inject 150 mg into the skin every 14 (fourteen) days.  . ALPRAZolam (XANAX) 0.5 MG tablet Take 0.5 mg by mouth 3 (three) times daily as needed for anxiety.   Marland Kitchen amLODipine (NORVASC) 5 MG tablet Take 2.5 mg by mouth daily.   Marland Kitchen amoxicillin (AMOXIL) 250 MG capsule Take 250 mg by mouth daily.  Marland Kitchen ascorbic acid (VITAMIN C) 1000 MG tablet Take 1,000 mg by mouth daily.  Marland Kitchen aspirin EC 81 MG tablet Take 81 mg by mouth daily.  . benazepril (LOTENSIN) 20 MG tablet Take 20 mg by mouth daily.  . Blood Glucose Monitoring Suppl (ACCU-CHEK AVIVA PLUS) w/Device KIT See admin instructions.  . Cholecalciferol 25 MCG (1000 UT) tablet Take 1,000 Units by mouth daily.  . Cranberry (RA CRANBERRY) 500 MG CAPS Take 1 Can by mouth 2 (two) times daily.   . cyanocobalamin (,VITAMIN B-12,) 1000 MCG/ML injection Inject into the muscle every 30 (thirty) days.  Marland Kitchen donepezil  (ARICEPT) 5 MG tablet Take 5 mg by mouth at bedtime.  . fluticasone (FLONASE) 50 MCG/ACT nasal spray Place 1 spray into the nose 2 (two) times daily.  . furosemide (LASIX) 40 MG tablet TAKE 1 TABLET BY MOUTH TWICE(2) DAILY  . gabapentin (NEURONTIN) 300 MG capsule Take 300 mg by mouth daily.  Marland Kitchen glimepiride (AMARYL) 1 MG tablet Take 0.5 tablets by mouth in the morning and at bedtime.   . Insulin Glargine (LANTUS SOLOSTAR) 100 UNIT/ML Solostar Pen Inject 10 units at HS may increase to max 40 units per day based upon blood sugars  . isosorbide mononitrate (IMDUR) 30 MG 24 hr tablet Take 1 tablet (30 mg total) by mouth daily.  . isosorbide mononitrate (IMDUR) 30 MG 24 hr tablet Take 30 mg by mouth daily.  Marland Kitchen levothyroxine (SYNTHROID, LEVOTHROID) 75 MCG tablet Take 75 mcg by mouth daily.  . metFORMIN (GLUCOPHAGE-XR) 500 MG 24 hr tablet Take 500 mg by mouth 2 (two) times daily.  . metoprolol tartrate (LOPRESSOR) 50 MG tablet Take 50 mg by mouth 2 (two) times daily.  . Mirabegron (MYRBETRIQ PO) Take 50 mg by mouth daily.  . montelukast (SINGULAIR) 10 MG tablet Take 10 mg by mouth at bedtime.  . nitroGLYCERIN (NITROSTAT) 0.4 MG SL tablet Place 1 tablet (0.4 mg total) under the tongue every 5 (five) minutes x 3 doses as needed for chest pain.  Marland Kitchen PREMARIN vaginal cream 3 (three) times a week.  . SYMBICORT 160-4.5 MCG/ACT inhaler Inhale 2 puffs into the lungs 2 (two) times daily.  . traMADol (ULTRAM) 50 MG tablet Take 50 mg by mouth every 6 (six) hours as needed.     Allergies:   Doxycycline, Nitrofurantoin, and  Statins   Social History   Socioeconomic History  . Marital status: Widowed    Spouse name: Not on file  . Number of children: Not on file  . Years of education: Not on file  . Highest education level: Not on file  Occupational History  . Not on file  Tobacco Use  . Smoking status: Never Smoker  . Smokeless tobacco: Current User    Types: Snuff  Vaping Use  . Vaping Use: Never used    Substance and Sexual Activity  . Alcohol use: No  . Drug use: No  . Sexual activity: Not on file  Other Topics Concern  . Not on file  Social History Narrative  . Not on file   Social Determinants of Health   Financial Resource Strain:   . Difficulty of Paying Living Expenses: Not on file  Food Insecurity:   . Worried About Charity fundraiser in the Last Year: Not on file  . Ran Out of Food in the Last Year: Not on file  Transportation Needs:   . Lack of Transportation (Medical): Not on file  . Lack of Transportation (Non-Medical): Not on file  Physical Activity:   . Days of Exercise per Week: Not on file  . Minutes of Exercise per Session: Not on file  Stress:   . Feeling of Stress : Not on file  Social Connections:   . Frequency of Communication with Friends and Family: Not on file  . Frequency of Social Gatherings with Friends and Family: Not on file  . Attends Religious Services: Not on file  . Active Member of Clubs or Organizations: Not on file  . Attends Archivist Meetings: Not on file  . Marital Status: Not on file     Family History: The patient's family history includes Cancer in her sister; Diabetes in her mother; Hypertension in her father and mother. ROS:   Please see the history of present illness.    All other systems reviewed and are negative.  EKGs/Labs/Other Studies Reviewed:    The following studies were reviewed today:  EKG:  EKG ordered today and personally reviewed.  The ekg ordered today demonstrates sinus rhythm stable pattern left bundle branch block  Recent Labs: 03/28/2019: NT-Pro BNP 343 02/08/2020: B Natriuretic Peptide 76.3 02/10/2020: ALT 17; BUN 28; Creatinine 1.2; Hemoglobin 12.1; Platelets 341; Potassium 4.5; Sodium 134  Recent Lipid Panel    Component Value Date/Time   CHOL 208 (H) 03/28/2019 1519   TRIG 190 (H) 03/28/2019 1519   HDL 86 03/28/2019 1519   CHOLHDL 2.4 03/28/2019 1519   LDLCALC 91 03/28/2019 1519     Physical Exam:    VS:  BP 124/71   Pulse 60   Ht $R'5\' 4"'ww$  (1.626 m)   Wt 161 lb (73 kg)   SpO2 94%   BMI 27.64 kg/m     Wt Readings from Last 3 Encounters:  03/21/20 161 lb (73 kg)  02/08/20 160 lb 3.2 oz (72.7 kg)  09/14/19 161 lb 3.2 oz (73.1 kg)     GEN: Appears younger than her age does not look chronically ill or debilitated well nourished, well developed in no acute distress HEENT: Normal NECK: No JVD; No carotid bruits LYMPHATICS: No lymphadenopathy CARDIAC: RRR, no murmurs, rubs, gallops RESPIRATORY:  Clear to auscultation without rales, wheezing or rhonchi  ABDOMEN: Soft, non-tender, non-distended MUSCULOSKELETAL:  No edema; No deformity  SKIN: Warm and dry NEUROLOGIC:  Alert and oriented x 3 PSYCHIATRIC:  Normal affect    Signed, Wendy More, MD  03/21/2020 1:44 PM    [facility]

## 2020-03-21 NOTE — Patient Instructions (Signed)

## 2020-06-20 DIAGNOSIS — M48062 Spinal stenosis, lumbar region with neurogenic claudication: Secondary | ICD-10-CM

## 2020-06-20 HISTORY — DX: Spinal stenosis, lumbar region with neurogenic claudication: M48.062

## 2020-06-21 DIAGNOSIS — E876 Hypokalemia: Secondary | ICD-10-CM

## 2020-06-21 HISTORY — DX: Hypokalemia: E87.6

## 2020-07-12 ENCOUNTER — Other Ambulatory Visit: Payer: Self-pay | Admitting: Cardiology

## 2020-07-12 ENCOUNTER — Telehealth: Payer: Self-pay | Admitting: Cardiology

## 2020-07-12 NOTE — Progress Notes (Unsigned)
Cardiology Office Note:    Date:  07/13/2020   ID:  Wendy Leon, DOB 12-20-1932, MRN [id number]  PCP:  Raina Mina., MD  Cardiologist:  Shirlee More, MD    Referring MD: Raina Mina., MD    ASSESSMENT:    1. Coronary artery disease of native artery of native heart with stable angina pectoris ([place])   2. Essential hypertension   3. Mixed hyperlipidemia   4. Shortness of breath   5. Statin intolerance   6. Hypertensive heart disease with heart failure ([place])   7. CKD stage 3 due to type 2 diabetes mellitus ([place])    PLAN:    In order of problems listed above:  1. She is having increased frequency of angina but the pattern is not unstable.  She certainly is at high risk for elective cardiac interventions after discussion with the patient and her daughter they want to take a careful cautious approach we will continue medical therapy including aspirin lipid-lowering beta-blocker calcium channel blocker and intensify nitrates.  If symptoms progress will need to consider elective PCI of her LAD. 2. Stable BP heart failure compensated continue her current diuretic check proBNP level 3. Continue nonstatin treatment for CAD including PCSK9 inhibitor check liver function lipid profile 4. Recheck kidney function with CKD   Next appointment: 2 months her daughter will contact us if symptoms progress   Medication Adjustments/Labs and Tests Ordered: Current medicines are reviewed at length with the patient today.  Concerns regarding medicines are outlined above.  Orders Placed This Encounter  Procedures  . CBC  . Comprehensive metabolic panel  . Lipid panel  . Pro b natriuretic peptide (BNP)  . EKG 12-Lead   No orders of the defined types were placed in this encounter.   Chief Complaint  Patient presents with  . Follow-up  . Coronary Artery Disease    History of Present Illness:    Wendy Leon is a 87 y.o. female with a hx of CAD hypertensive heart disease with heart  failure hyperlipidemia and statin intolerance and stage III CKD 2 diabetes mellitus.  She was last seen 03/21/2020.  She called my office and is worked in today to be seen for chest pain.  Cardiac CTA showed a 60% proximal LAD stenosis although FFR was diminished and she is medically treated at her choice and in the setting of severe comorbidities. Compliance with diet, lifestyle and medications: Yes  Patient's daughter phoned yesterday was concerned her mother is not doing as well she has a chronic pattern of early morning angina maybe once a week wakes her in the morning takes nitroglycerin is relieved she has also had some daytime episodes of pressure in her chest again is taken nitroglycerin with relief.  The symptoms have progressed but are not unstable.  We discussed the potential for cardiac interventions everybody is extremely reluctant and that she is so frail and her underlying CKD organ intensify medical therapy adding topical nitrates at bedtime and increase her daytime nitrates.  I do not think that we can increase her beta-blocker the resting heart rate of 61 and she has had severe orthostatic hypotension in the past and will continue her present calcium channel blocker.  She has difficulty ambulating is elderly has CKD and I am going to avoid ranolazine for the time being.  If this is ineffective and her symptoms progress will need to reluctantly consider cardiac intervention.  She also is short of breath during the day but  has no evidence of volume overload. Past Medical History:  Diagnosis Date  . Abnormal gait 06/07/2009   Last Assessment & Plan:  She saw specialist who has given her orders for PT and she is wanting to have it setup for her at [facility] where she already is receiving care from at home  . Acid reflux 02/20/2014  . Aftercare following surgery 11/27/2015  . Anemia of unknown etiology 10/18/2016  . Anxiety 02/20/2014  . Atrial fibrillation ([place])   . CAD (coronary artery  disease)   . Cerebral ischemia 03/11/2016   Overview:  Microvascular ischemia seen on MRI 01/2015 with partial empty sella turcica neurology appt 03/12/2016 pending with [facility], also has [facility] with PT coming to the house for her gait abnormality and lumbar DDD, as well as local ortho ([place])   . Chest pain 11/03/2018  . CHF (congestive heart failure) ([place]) 06/24/2015   Last Assessment & Plan:  Relevant Hx: Course: Daily Update: Today's Plan:she has been doing well overall from her heart and no SOB  Electronically signed by: Mayer Camel, NP 06/26/15 1522  . Chronic diastolic heart failure ([place]) 06/24/2015   Last Assessment & Plan:  Relevant Hx: Course: Daily Update: Today's Plan:she has been doing well overall from her heart and no SOB  Electronically signed by: Mayer Camel, NP 06/26/15 1522  . Chronic UTI 06/27/2015   Last Assessment & Plan:  She requested a UA be checked for her as she was having more frequency than before and it has been awhile since she was on oral abx for this, she is on low dose maintenance med to prevent and has done well with that.  . CKD stage 3 due to type 2 diabetes mellitus ([place]) 02/24/2018  . Colon polyps   . COPD (chronic obstructive pulmonary disease) ([place]) 03/09/2012   Last Assessment & Plan:  She feels she is stable from this discussed with her the allergies she has and she is going to discuss with Dr. Alcide Clever having allergy injections as well for this   . Diabetes ([place]) 02/20/2014  . Dizziness 02/07/2016   Overview:  Chronic and has been having falls, MRI showing chronic microvascular ischemia, as well as partial sella turcica pending neuro appt for her with [facility]  . Dyslipidemia 07/29/2015  . Edema 06/24/2015   Last Assessment & Plan:  Relevant Hx: Course: Daily Update: Today's Plan:this is stable for her overall and will follow for her  Electronically signed by: Mayer Camel, NP 06/27/15 0813  . Elevated cholesterol    . Essential hypertension 03/09/2012   Last Assessment & Plan:  On repeat this was stable for her and she does not check it at home much but when has been checked has been fine for her  . Fistula 02/20/2014  . Gastroesophageal reflux disease without esophagitis 02/20/2014   Last Assessment & Plan:  Relevant Hx: Course: Daily Update: Today's Plan:not on meds for this as she has had issues with her arms cramping and she is going to just drink buttermilk  Electronically signed by: Mayer Camel, NP 06/26/15 1530  . Generalized abdominal pain 10/24/2016  . Hallux valgus of left foot 12/04/2015  . Hallux valgus of right foot 12/04/2015  . High risk medication use 06/24/2015  . History of recurrent UTIs 06/15/2019  . Hx of valvular heart disease 01/29/2016  . Hypertension 02/20/2014  . Hypertensive heart disease with heart failure ([place]) 03/09/2012   Last Assessment & Plan:  On repeat this was stable for her and she does not check it at home much but when has been checked has been fine for her  . Hypothyroidism (acquired) 03/09/2012   Last Assessment & Plan:  She was due for this to be updated and have advised her to get this drawn while here today  . IBS (irritable bowel syndrome) 06/24/2015   Last Assessment & Plan:  Relevant Hx: Course: Daily Update: Today's Plan:this is stable for her  Electronically signed by: Mayer Camel, NP 06/26/15 1531  . IBS (irritable bowel syndrome)   . Idiopathic progressive polyneuropathy 06/07/2009  . Left bundle branch block 06/24/2015  . Leukocytosis 06/24/2015   Last Assessment & Plan:  Relevant Hx: Course: Daily Update: Today's Plan:this has been stable for her with FU through hematology who felt she was having benign swings in this and she chose to not have bone marrow biopsy done   Electronically signed by: Mayer Camel, NP 06/27/15 [phone]  . Liposarcoma, well differentiated type ([place]) 03/09/2012   Overview:  Initial excision  03/18/2000 (Dr. Ralene Cork [facility]), 8 x 6 x 3 cm. Local recurrence, re-excision 03/23/2007 [facility]iss Norse), 5 x 3 x 2 cm. Surveillance plan: baseline MRI April 2014  Last Assessment & Plan:  Relevant Hx: Course: Daily Update: Today's Plan:she is stable from this  Electronically signed by: Mayer Camel, NP 06/26/15 1533  . Lumbar degenerative disc disease 11/06/2015   Last Assessment & Plan:  At this point she needs updated MRI of her lumbar spine and we discussed with her gait abnormality she is likely going to have to see specialist at [facility] for this, she has seen local neuro for this several years ago in terms of her neuropathy and her falls and he really did not address it . She was advised several years ago to have back surgery locally which we did not feel was ideal with her sugars at the time being so out of control and now she is really more in control than then and it may be doable for her if felt  Needed though she would have to have cardiology clear her as well.  . Mild CAD 07/29/2015   Overview:  Overview:  Cath march 2016 with 60% LAD with normal FFR   Overview:  Cath march 2016 with 60% LAD with normal FFR   . Mixed hyperlipidemia 06/24/2015   Last Assessment & Plan:  Relevant Hx: Course: Daily Update: Today's Plan:update her lipids for her today and she is trying to watch her diet   Electronically signed by: Mayer Camel, NP 06/27/15 0813  . Obstructive sleep apnea syndrome 06/24/2015  . Onychomycosis due to dermatophyte 11/27/2015  . 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   Last Assessment & Plan:  Relevant Hx: Course: Daily Update: Today's Plan:she is doing well overall  Electronically signed by: Mayer Camel, NP 06/26/15 1531  . Pancreatitis   . Pre-ulcerative calluses 12/04/2015  . Pre-ulcerative corn or callous 11/27/2015  . Primary osteoarthritis involving multiple joints 07/06/2015   Last Assessment & Plan:  Relevant Hx: Course: Daily Update: Today's  Plan:she has DDD of her back with some spinal  Stenosis that she has seen ortho locally for and he wanted to do surgery which she has avoided and I agree with, she is going to use the flexeril for this right now as she is having more muscle spasm than what she has had in the past from her attempt to walk down some  very steep steps that she was not used to . She should use her walker for support through the weekend and avoid overhead strain and lifting and stay in touch if not better.  Electronically signed by: Mayer Camel, NP 07/06/15 [phone]  . Seasonal allergic rhinitis 01/12/2019  . Sleep apnea   . Stress incontinence of urine 04/17/2016  . Syncope and collapse 10/13/2016  . Thyroid disease   . TIA (transient ischemic attack) 10/13/2016  . Type 2 diabetes mellitus without complication, without long-term current use of insulin ([place]) 03/09/2012   Last Assessment & Plan:  She brings in readings of her sugars which show she has been doing much better overall with this at home and quite pleased with her  . UTI (urinary tract infection)   . Vascular calcification 01/29/2016  . Visual changes 01/29/2016   Overview:  Following with Dr. Julio Alm as well as had recent carotid doppler showing minimal plaque present and vertebral arteries normal , nothing signficant  . Vitamin B12 deficiency 12/04/2015    Past Surgical History:  Procedure Laterality Date  . ABDOMINAL HYSTERECTOMY    . CHOLECYSTECTOMY    . EYE SURGERY    . PANCREAS SURGERY    . skin cancer lesion      Current Medications: Current Meds  Medication Sig  . albuterol (PROVENTIL) (2.5 MG/3ML) 0.083% nebulizer solution SMARTSIG:1 Via Nebulizer 4-5 Times Daily  . albuterol (VENTOLIN HFA) 108 (90 Base) MCG/ACT inhaler Inhale 2 puffs into the lungs every 4 (four) hours as needed.  . Alirocumab (PRALUENT) 150 MG/ML SOAJ Inject 150 mg into the skin every 14 (fourteen) days.  . ALPRAZolam (XANAX) 0.5 MG tablet Take 0.5 mg by mouth 3  (three) times daily as needed for anxiety.   Marland Kitchen amLODipine (NORVASC) 5 MG tablet Take 2.5 mg by mouth daily.   Marland Kitchen ascorbic acid (VITAMIN C) 1000 MG tablet Take 1,000 mg by mouth daily.  Marland Kitchen aspirin EC 81 MG tablet Take 81 mg by mouth daily.  . benazepril (LOTENSIN) 20 MG tablet Take 20 mg by mouth daily.  . Blood Glucose Monitoring Suppl (ACCU-CHEK AVIVA PLUS) w/Device KIT See admin instructions.  . cephALEXin (KEFLEX) 250 MG capsule Take 250 mg by mouth daily.  . Cholecalciferol 25 MCG (1000 UT) tablet Take 1,000 Units by mouth daily.  . Cranberry 500 MG CAPS Take 1 Can by mouth 2 (two) times daily.   . cyanocobalamin (,VITAMIN B-12,) 1000 MCG/ML injection Inject into the muscle every 30 (thirty) days.  Marland Kitchen donepezil (ARICEPT) 5 MG tablet Take 5 mg by mouth at bedtime.  . fluticasone (FLONASE) 50 MCG/ACT nasal spray Place 1 spray into the nose 2 (two) times daily.  . furosemide (LASIX) 40 MG tablet TAKE 1 TABLET BY MOUTH TWICE(2) DAILY  . gabapentin (NEURONTIN) 300 MG capsule Take 300 mg by mouth daily.  Marland Kitchen glimepiride (AMARYL) 1 MG tablet Take 0.5 tablets by mouth in the morning and at bedtime.   . insulin glargine (LANTUS) 100 UNIT/ML Solostar Pen Inject 10 units at HS may increase to max 40 units per day based upon blood sugars  . isosorbide mononitrate (IMDUR) 30 MG 24 hr tablet Take 30 mg by mouth daily.  Marland Kitchen levothyroxine (SYNTHROID, LEVOTHROID) 75 MCG tablet Take 75 mcg by mouth daily.  Marland Kitchen loratadine (CLARITIN) 10 MG tablet as needed.  . metFORMIN (GLUCOPHAGE-XR) 500 MG 24 hr tablet Take 500 mg by mouth 2 (two) times daily.  . metoprolol tartrate (LOPRESSOR) 50 MG tablet Take  50 mg by mouth 2 (two) times daily.  . montelukast (SINGULAIR) 10 MG tablet Take 10 mg by mouth at bedtime.  Marland Kitchen MYRBETRIQ 50 MG TB24 tablet Take 50 mg by mouth daily.  . nitroGLYCERIN (NITROSTAT) 0.4 MG SL tablet TAKE 1 TABLET UNDER THE TONGUE AS NEEDEDFOR CHEST PAIN ( MAY REPEAT EVERY 5 MINUTES X 3)  . PREMARIN vaginal  cream 3 (three) times a week.  . SYMBICORT 160-4.5 MCG/ACT inhaler Inhale 2 puffs into the lungs 2 (two) times daily.  . traMADol (ULTRAM) 50 MG tablet Take 50 mg by mouth every 6 (six) hours as needed.  . [DISCONTINUED] amoxicillin (AMOXIL) 250 MG capsule Take 250 mg by mouth daily.  . [DISCONTINUED] Mirabegron (MYRBETRIQ PO) Take 50 mg by mouth daily.     Allergies:   Doxycycline, Nitrofurantoin, Statins, Sulfamethoxazole, and Trimethoprim   Social History   Socioeconomic History  . Marital status: Widowed    Spouse name: Not on file  . Number of children: Not on file  . Years of education: Not on file  . Highest education level: Not on file  Occupational History  . Not on file  Tobacco Use  . Smoking status: Never Smoker  . Smokeless tobacco: Current User    Types: Snuff  Vaping Use  . Vaping Use: Never used  Substance and Sexual Activity  . Alcohol use: No  . Drug use: No  . Sexual activity: Not on file  Other Topics Concern  . Not on file  Social History Narrative  . Not on file   Social Determinants of Health   Financial Resource Strain: Not on file  Food Insecurity: Not on file  Transportation Needs: Not on file  Physical Activity: Not on file  Stress: Not on file  Social Connections: Not on file     Family History: The patient's family history includes Cancer in her sister; Diabetes in her mother; Hypertension in her father and mother. ROS:   Please see the history of present illness.    All other systems reviewed and are negative.  EKGs/Labs/Other Studies Reviewed:    The following studies were reviewed today:  EKG:  EKG ordered today and personally reviewed.  The ekg ordered today demonstrates sinus rhythm left bundle branch block  Recent Labs: 02/08/2020: B Natriuretic Peptide 76.3 02/10/2020: ALT 17; BUN 28; Creatinine 1.2; Hemoglobin 12.1; Platelets 341; Potassium 4.5; Sodium 134  Recent Lipid Panel    Component Value Date/Time   CHOL 208 (H)  03/28/2019 1519   TRIG 190 (H) 03/28/2019 1519   HDL 86 03/28/2019 1519   CHOLHDL 2.4 03/28/2019 1519   LDLCALC 91 03/28/2019 1519    Physical Exam:    VS:  BP 132/62   Pulse 61   Ht _0  (1.626 m)   Wt 165 lb (74.8 kg)   SpO2 96%   BMI 28.32 kg/m     Wt Readings from Last 3 Encounters:  07/13/20 165 lb (74.8 kg)  03/21/20 161 lb (73 kg)  02/08/20 160 lb 3.2 oz (72.7 kg)     GEN: Frail looking well nourished, well developed in no acute distress HEENT: Normal NECK: No JVD; No carotid bruits LYMPHATICS: No lymphadenopathy CARDIAC: S2 paradoxical RRR, no murmurs, rubs, gallops RESPIRATORY:  Clear to auscultation without rales, wheezing or rhonchi  ABDOMEN: Soft, non-tender, non-distended MUSCULOSKELETAL:  No edema; No deformity  SKIN: Warm and dry NEUROLOGIC:  Alert and oriented x 3 PSYCHIATRIC:  Normal affect    Signed, Aaron Edelman  Bettina Gavia, MD  07/13/2020 9:44 AM    [facility]

## 2020-07-12 NOTE — Telephone Encounter (Signed)
Called patient and her daughter back. Patient complaining of frequent chest pain, goes away when taking nitro, only in the morning most times, but last night chest was feeling heavy and now getting SOB with activity. Patient stated her and Dr. Bettina Gavia had talked about heart cath in past, but did not get done due to COVID pandemic at the time. Patient would like to see Dr. Bettina Gavia to discuss. Made patient an appointment with Dr. Bettina Gavia tomorrow morning. Informed patient that in the mean time if she starts having chest pain again and it is not relieved with Nitro then to call 911. Patient verbalized understanding.

## 2020-07-12 NOTE — Telephone Encounter (Signed)
Pt c/o of Chest Pain: STAT if CP now or developed within 24 hours  1. Are you having CP right now? no  2. Are you experiencing any other symptoms (ex. SOB, nausea, vomiting, sweating)? Sob with exertion  3. How long have you been experiencing CP? A week  4. Is your CP continuous or coming and going? Coming and going  5. Have you taken Nitroglycerin? yes ? Chest pain has been going on and off past week and taken Nitroglycerin the past two days that help with the chest pain. Patients has be scheduled for April 1st because of CP

## 2020-07-13 ENCOUNTER — Other Ambulatory Visit: Payer: Self-pay

## 2020-07-13 ENCOUNTER — Ambulatory Visit: Payer: Medicare Other | Admitting: Cardiology

## 2020-07-13 ENCOUNTER — Encounter: Payer: Self-pay | Admitting: Cardiology

## 2020-07-13 VITALS — BP 132/62 | HR 61 | Ht 64.0 in | Wt 165.0 lb

## 2020-07-13 DIAGNOSIS — I25118 Atherosclerotic heart disease of native coronary artery with other forms of angina pectoris: Secondary | ICD-10-CM

## 2020-07-13 DIAGNOSIS — I10 Essential (primary) hypertension: Secondary | ICD-10-CM | POA: Diagnosis not present

## 2020-07-13 DIAGNOSIS — N183 Chronic kidney disease, stage 3 unspecified: Secondary | ICD-10-CM

## 2020-07-13 DIAGNOSIS — R0602 Shortness of breath: Secondary | ICD-10-CM | POA: Diagnosis not present

## 2020-07-13 DIAGNOSIS — E782 Mixed hyperlipidemia: Secondary | ICD-10-CM

## 2020-07-13 DIAGNOSIS — E1122 Type 2 diabetes mellitus with diabetic chronic kidney disease: Secondary | ICD-10-CM

## 2020-07-13 DIAGNOSIS — Z789 Other specified health status: Secondary | ICD-10-CM

## 2020-07-13 DIAGNOSIS — I110 Hypertensive heart disease with heart failure: Secondary | ICD-10-CM

## 2020-07-13 NOTE — Patient Instructions (Signed)
Medication Instructions:  Your physician recommends that you continue on your current medications as directed. Please refer to the Current Medication list given to you today.  *If you need a refill on your cardiac medications before your next appointment, please call your pharmacy*   Lab Work: Your physician recommends that you return for lab work in: TODAY CBC, CMP, ProBNP, Lipids If you have labs (blood work) drawn today and your tests are completely normal, you will receive your results only by: Marland Kitchen MyChart Message (if you have MyChart) OR . A paper copy in the mail If you have any lab test that is abnormal or we need to change your treatment, we will call you to review the results.   Testing/Procedures: None   Follow-Up: At [facility], you and your health needs are our priority.  As part of our continuing mission to provide you with exceptional heart care, we have created designated Provider Care Teams.  These Care Teams include your primary Cardiologist (physician) and Advanced Practice Providers (APPs -  Physician Assistants and Nurse Practitioners) who all work together to provide you with the care you need, when you need it.  We recommend signing up for the patient portal called "MyChart".  Sign up information is provided on this After Visit Summary.  MyChart is used to connect with patients for Virtual Visits (Telemedicine).  Patients are able to view lab/test results, encounter notes, upcoming appointments, etc.  Non-urgent messages can be sent to your provider as well.   To learn more about what you can do with MyChart, go to [email or web address].    Your next appointment:   8 week(s)  The format for your next appointment:   In Person  Provider:   Shirlee More, MD   Other Instructions

## 2020-07-14 LAB — COMPREHENSIVE METABOLIC PANEL
ALT: 17 IU/L (ref 0–32)
AST: 17 IU/L (ref 0–40)
Albumin/Globulin Ratio: 1.8 (ref 1.2–2.2)
Albumin: 4.4 g/dL (ref 3.6–4.6)
Alkaline Phosphatase: 56 IU/L (ref 44–121)
BUN/Creatinine Ratio: 17 (ref 12–28)
BUN: 15 mg/dL (ref 8–27)
Bilirubin Total: 0.3 mg/dL (ref 0.0–1.2)
CO2: 25 mmol/L (ref 20–29)
Calcium: 9.9 mg/dL (ref 8.7–10.3)
Chloride: 92 mmol/L — ABNORMAL LOW (ref 96–106)
Creatinine, Ser: 0.89 mg/dL (ref 0.57–1.00)
Globulin, Total: 2.5 g/dL (ref 1.5–4.5)
Glucose: 119 mg/dL — ABNORMAL HIGH (ref 65–99)
Potassium: 3.7 mmol/L (ref 3.5–5.2)
Sodium: 136 mmol/L (ref 134–144)
Total Protein: 6.9 g/dL (ref 6.0–8.5)
eGFR: 63 mL/min/{1.73_m2} (ref >59)

## 2020-07-14 LAB — LIPID PANEL
Chol/HDL Ratio: 2.7 ratio (ref 0.0–4.4)
Cholesterol, Total: 178 mg/dL (ref 100–199)
HDL: 66 mg/dL (ref >39)
LDL Chol Calc (NIH): 89 mg/dL (ref 0–99)
Triglycerides: 133 mg/dL (ref 0–149)
VLDL Cholesterol Cal: 23 mg/dL (ref 5–40)

## 2020-07-14 LAB — CBC
Hematocrit: 35.1 % (ref 34.0–46.6)
Hemoglobin: 12 g/dL (ref 11.1–15.9)
MCH: 30.5 pg (ref 26.6–33.0)
MCHC: 34.2 g/dL (ref 31.5–35.7)
MCV: 89 fL (ref 79–97)
Platelets: 341 10*3/uL (ref 150–450)
RBC: 3.94 x10E6/uL (ref 3.77–5.28)
RDW: 12 % (ref 11.7–15.4)
WBC: 14.7 10*3/uL — ABNORMAL HIGH (ref 3.4–10.8)

## 2020-07-14 LAB — PRO B NATRIURETIC PEPTIDE: NT-Pro BNP: 191 pg/mL (ref 0–738)

## 2020-07-16 ENCOUNTER — Telehealth: Payer: Self-pay

## 2020-07-16 MED ORDER — NITRO-BID 2 % TD OINT: 1.0000 [in_us] | TOPICAL_OINTMENT | Freq: Every day | TRANSDERMAL | 3 refills | Status: DC

## 2020-07-16 NOTE — Addendum Note (Signed)
Addended by: Resa Miner I on: 07/16/2020 08:51 AM   Modules accepted: Orders

## 2020-07-16 NOTE — Telephone Encounter (Signed)
-----   Message from Richardo Priest, MD sent at 07/15/2020  7:27 PM EDT ----- Good stable results no changes

## 2020-07-16 NOTE — Telephone Encounter (Signed)
Spoke to the patient and patients daughter and let her know that I have sent this medication in for them per Dr. Joya Gaskins recommendation. They verbalize understanding and thank me for calling back.

## 2020-07-16 NOTE — Telephone Encounter (Signed)
Yes nitroglycerin paste dispense 1 tube apply 1 inch at bedtime move a.m.

## 2020-07-16 NOTE — Telephone Encounter (Signed)
Spoke with patient regarding results and recommendation.  Patient verbalizes understanding and is agreeable to plan of care. Advised patient to call back with any issues or concerns.  

## 2020-07-23 ENCOUNTER — Other Ambulatory Visit: Payer: Self-pay | Admitting: Cardiology

## 2020-07-23 NOTE — Telephone Encounter (Signed)
Imdur 30 approved and sent

## 2020-07-27 ENCOUNTER — Ambulatory Visit: Payer: Medicare Other | Admitting: Cardiology

## 2020-09-12 ENCOUNTER — Other Ambulatory Visit: Payer: Self-pay

## 2020-09-12 ENCOUNTER — Ambulatory Visit: Payer: Medicare Other | Admitting: Cardiology

## 2020-09-12 ENCOUNTER — Encounter: Payer: Self-pay | Admitting: Cardiology

## 2020-09-12 VITALS — BP 130/80 | HR 62 | Ht 64.0 in | Wt 166.0 lb

## 2020-09-12 DIAGNOSIS — E1122 Type 2 diabetes mellitus with diabetic chronic kidney disease: Secondary | ICD-10-CM | POA: Diagnosis not present

## 2020-09-12 DIAGNOSIS — N183 Chronic kidney disease, stage 3 unspecified: Secondary | ICD-10-CM

## 2020-09-12 DIAGNOSIS — E782 Mixed hyperlipidemia: Secondary | ICD-10-CM

## 2020-09-12 DIAGNOSIS — I25118 Atherosclerotic heart disease of native coronary artery with other forms of angina pectoris: Secondary | ICD-10-CM | POA: Diagnosis not present

## 2020-09-12 DIAGNOSIS — I447 Left bundle-branch block, unspecified: Secondary | ICD-10-CM

## 2020-09-12 DIAGNOSIS — I110 Hypertensive heart disease with heart failure: Secondary | ICD-10-CM

## 2020-09-12 DIAGNOSIS — Z789 Other specified health status: Secondary | ICD-10-CM

## 2020-09-12 NOTE — Patient Instructions (Addendum)

## 2020-09-12 NOTE — Progress Notes (Signed)
Cardiology Office Note:    Date:  09/12/2020   ID:  Wendy Leon, DOB Jun 14, 1932, MRN [id number]  PCP:  Raina Mina., MD  Cardiologist:  Shirlee More, MD    Referring MD: Raina Mina., MD she will need a lipid profile and CMP in September and think she has an appointment in her office   ASSESSMENT:    1. Coronary artery disease of native artery of native heart with stable angina pectoris ([place])   2. Hypertensive heart disease with heart failure ([place])   3. CKD stage 3 due to type 2 diabetes mellitus ([place])   4. Left bundle branch block   5. Mixed hyperlipidemia   6. Statin intolerance    PLAN:    In order of problems listed above:  1. Markedly improved with the addition of topical nitroglycerin optimize antianginal therapy [place] Heart Association class I continue treatment including aspirin calcium channel blocker oral nitrate topical nitroglycerin at bedtime beta-blocker.  I would not advise elective revascularization due to comorbidities personal choices and CKD at this time 2. BP at target continue current treatment she is not having symptomatic orthostatic hypotension 3.  Stable her last GFR was actually improved. 4.  Stable EKG pattern 5.  Continue nonstatin therapy lipids are at target high risk of CAD I will ask her PCP to do a lipid profile and CMP at her follow-up visit this fall   Next appointment: 6 months   Medication Adjustments/Labs and Tests Ordered: Current medicines are reviewed at length with the patient today.  Concerns regarding medicines are outlined above.  No orders of the defined types were placed in this encounter.  No orders of the defined types were placed in this encounter.   Chief Complaint  Patient presents with  . Follow-up  . Coronary Artery Disease    History of Present Illness:    Wendy Leon is a 87 y.o. female with a hx of CAD hypertensive heart disease with heart failure hyperlipidemia and statin intolerance and stage III CKD  2 diabetes mellitus  last seen 07/13/2020.Cardiac CTA showed a 60% proximal LAD stenosis although FFR was diminished and she is medically treated at her choice and in the setting of severe comorbidities.   Compliance with diet, lifestyle and medications: Yes  Reaching back into, put on topical nitroglycerin paste for her angina and she is done well and has had no further episodes. She enjoys the quality of her life and just came back from a visit to [facility] No angina shortness of breath orthopnea palpitation or syncope. She had edema after the car driving with increased dietary sodium but is cleared She tolerates Praluent without muscle pain or weakness she is statin intolerant.  Recent labs [facility] PCP 06/01/2020: Sodium 138 potassium 3.4 creatinine 1.07 GFR 51 cc 01/30/2020: Cholesterol 143 LDL 70 triglycerides 138 HDL 54 non-HDL cholesterol 89 with Praluent therapy Past Medical History:  Diagnosis Date  . Abnormal gait 06/07/2009   Last Assessment & Plan:  She saw specialist who has given her orders for PT and she is wanting to have it setup for her at [facility] where she already is receiving care from at home  . Acid reflux 02/20/2014  . Aftercare following surgery 11/27/2015  . Anemia of unknown etiology 10/18/2016  . Anxiety 02/20/2014  . Atrial fibrillation ([place])   . CAD (coronary artery disease)   . Cerebral ischemia 03/11/2016   Overview:  Microvascular ischemia seen on MRI  01/2015 with partial empty sella turcica neurology appt 03/12/2016 pending with [facility], also has [facility] with PT coming to the house for her gait abnormality and lumbar DDD, as well as local ortho ([place])   . Chest pain 11/03/2018  . CHF (congestive heart failure) ([place]) 06/24/2015   Last Assessment & Plan:  Relevant Hx: Course: Daily Update: Today's Plan:she has been doing well overall from her heart and no SOB  Electronically signed by: Mayer Camel, NP 06/26/15 1522  .  Chronic diastolic heart failure ([place]) 06/24/2015   Last Assessment & Plan:  Relevant Hx: Course: Daily Update: Today's Plan:she has been doing well overall from her heart and no SOB  Electronically signed by: Mayer Camel, NP 06/26/15 1522  . Chronic UTI 06/27/2015   Last Assessment & Plan:  She requested a UA be checked for her as she was having more frequency than before and it has been awhile since she was on oral abx for this, she is on low dose maintenance med to prevent and has done well with that.  . CKD stage 3 due to type 2 diabetes mellitus ([place]) 02/24/2018  . Colon polyps   . COPD (chronic obstructive pulmonary disease) ([place]) 03/09/2012   Last Assessment & Plan:  She feels she is stable from this discussed with her the allergies she has and she is going to discuss with Dr. Alcide Clever having allergy injections as well for this   . Diabetes ([place]) 02/20/2014  . Dizziness 02/07/2016   Overview:  Chronic and has been having falls, MRI showing chronic microvascular ischemia, as well as partial sella turcica pending neuro appt for her with [facility]  . Dyslipidemia 07/29/2015  . Edema 06/24/2015   Last Assessment & Plan:  Relevant Hx: Course: Daily Update: Today's Plan:this is stable for her overall and will follow for her  Electronically signed by: Mayer Camel, NP 06/27/15 0813  . Elevated cholesterol   . Essential hypertension 03/09/2012   Last Assessment & Plan:  On repeat this was stable for her and she does not check it at home much but when has been checked has been fine for her  . Fistula 02/20/2014  . Gastroesophageal reflux disease without esophagitis 02/20/2014   Last Assessment & Plan:  Relevant Hx: Course: Daily Update: Today's Plan:not on meds for this as she has had issues with her arms cramping and she is going to just drink buttermilk  Electronically signed by: Mayer Camel, NP 06/26/15 1530  . Generalized abdominal pain 10/24/2016  . Hallux valgus  of left foot 12/04/2015  . Hallux valgus of right foot 12/04/2015  . High risk medication use 06/24/2015  . History of recurrent UTIs 06/15/2019  . Hx of valvular heart disease 01/29/2016  . Hypertension 02/20/2014  . Hypertensive heart disease with heart failure ([place]) 03/09/2012   Last Assessment & Plan:  On repeat this was stable for her and she does not check it at home much but when has been checked has been fine for her  . Hypothyroidism (acquired) 03/09/2012   Last Assessment & Plan:  She was due for this to be updated and have advised her to get this drawn while here today  . IBS (irritable bowel syndrome) 06/24/2015   Last Assessment & Plan:  Relevant Hx: Course: Daily Update: Today's Plan:this is stable for her  Electronically signed by: Mayer Camel, NP 06/26/15 1531  . IBS (irritable bowel syndrome)   . Idiopathic progressive polyneuropathy 06/07/2009  .  Left bundle branch block 06/24/2015  . Leukocytosis 06/24/2015   Last Assessment & Plan:  Relevant Hx: Course: Daily Update: Today's Plan:this has been stable for her with FU through hematology who felt she was having benign swings in this and she chose to not have bone marrow biopsy done   Electronically signed by: Mayer Camel, NP 06/27/15 [phone]  . Liposarcoma, well differentiated type ([place]) 03/09/2012   Overview:  Initial excision 03/18/2000 ([facility]), 8 x 6 x 3 cm. Local recurrence, re-excision 03/23/2007 [facility]se), 5 x 3 x 2 cm. Surveillance plan: baseline MRI April 2014  Last Assessment & Plan:  Relevant Hx: Course: Daily Update: Today's Plan:she is stable from this  Electronically signed by: Mayer Camel, NP 06/26/15 1533  . Lumbar degenerative disc disease 11/06/2015   Last Assessment & Plan:  At this point she needs updated MRI of her lumbar spine and we discussed with her gait abnormality she is likely going to have to see specialist at [facility] for this, she has seen  local neuro for this several years ago in terms of her neuropathy and her falls and he really did not address it . She was advised several years ago to have back surgery locally which we did not feel was ideal with her sugars at the time being so out of control and now she is really more in control than then and it may be doable for her if felt  Needed though she would have to have cardiology clear her as well.  . Mild CAD 07/29/2015   Overview:  Overview:  Cath march 2016 with 60% LAD with normal FFR   Overview:  Cath march 2016 with 60% LAD with normal FFR   . Mixed hyperlipidemia 06/24/2015   Last Assessment & Plan:  Relevant Hx: Course: Daily Update: Today's Plan:update her lipids for her today and she is trying to watch her diet   Electronically signed by: Mayer Camel, NP 06/27/15 0813  . Obstructive sleep apnea syndrome 06/24/2015  . Onychomycosis due to dermatophyte 11/27/2015  . 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   Last Assessment & Plan:  Relevant Hx: Course: Daily Update: Today's Plan:she is doing well overall  Electronically signed by: Mayer Camel, NP 06/26/15 1531  . Pancreatitis   . Pre-ulcerative calluses 12/04/2015  . Pre-ulcerative corn or callous 11/27/2015  . Primary osteoarthritis involving multiple joints 07/06/2015   Last Assessment & Plan:  Relevant Hx: Course: Daily Update: Today's Plan:she has DDD of her back with some spinal  Stenosis that she has seen ortho locally for and he wanted to do surgery which she has avoided and I agree with, she is going to use the flexeril for this right now as she is having more muscle spasm than what she has had in the past from her attempt to walk down some very steep steps that she was not used to . She should use her walker for support through the weekend and avoid overhead strain and lifting and stay in touch if not better.  Electronically signed by: Mayer Camel, NP 07/06/15 [phone]  . Seasonal allergic rhinitis  01/12/2019  . Sleep apnea   . Stress incontinence of urine 04/17/2016  . Syncope and collapse 10/13/2016  . Thyroid disease   . TIA (transient ischemic attack) 10/13/2016  . Type 2 diabetes mellitus without complication, without long-term current use of insulin ([place]) 03/09/2012   Last Assessment & Plan:  She brings in readings of  her sugars which show she has been doing much better overall with this at home and quite pleased with her  . UTI (urinary tract infection)   . Vascular calcification 01/29/2016  . Visual changes 01/29/2016   Overview:  Following with Dr. Julio Alm as well as had recent carotid doppler showing minimal plaque present and vertebral arteries normal , nothing signficant  . Vitamin B12 deficiency 12/04/2015    Past Surgical History:  Procedure Laterality Date  . ABDOMINAL HYSTERECTOMY    . CHOLECYSTECTOMY    . EYE SURGERY    . PANCREAS SURGERY    . skin cancer lesion      Current Medications: Current Meds  Medication Sig  . albuterol (PROVENTIL) (2.5 MG/3ML) 0.083% nebulizer solution Take 2.5 mg by nebulization every 4 (four) hours as needed for wheezing or shortness of breath.  Marland Kitchen albuterol (VENTOLIN HFA) 108 (90 Base) MCG/ACT inhaler Inhale 2 puffs into the lungs every 4 (four) hours as needed.  . Alirocumab (PRALUENT) 150 MG/ML SOAJ Inject 150 mg into the skin every 14 (fourteen) days.  . ALPRAZolam (XANAX) 0.5 MG tablet Take 0.5 mg by mouth 3 (three) times daily as needed for anxiety.   Marland Kitchen amLODipine (NORVASC) 5 MG tablet Take 2.5 mg by mouth daily.   Marland Kitchen ascorbic acid (VITAMIN C) 1000 MG tablet Take 1,000 mg by mouth daily.  Marland Kitchen aspirin EC 81 MG tablet Take 81 mg by mouth daily.  . benazepril (LOTENSIN) 20 MG tablet Take 20 mg by mouth daily.  . Blood Glucose Monitoring Suppl (ACCU-CHEK AVIVA PLUS) w/Device KIT See admin instructions.  . cephALEXin (KEFLEX) 250 MG capsule Take 250 mg by mouth daily.  . Cholecalciferol 25 MCG (1000 UT) tablet Take 1,000 Units by mouth  daily.  . Cranberry 500 MG CAPS Take 1 Can by mouth 2 (two) times daily.   . cyanocobalamin (,VITAMIN B-12,) 1000 MCG/ML injection Inject into the muscle every 30 (thirty) days.  Marland Kitchen donepezil (ARICEPT) 5 MG tablet Take 5 mg by mouth at bedtime.  . fluticasone (FLONASE) 50 MCG/ACT nasal spray Place 1 spray into the nose 2 (two) times daily.  . furosemide (LASIX) 40 MG tablet TAKE 1 TABLET BY MOUTH TWICE(2) DAILY  . gabapentin (NEURONTIN) 300 MG capsule Take 300 mg by mouth daily.  Marland Kitchen glimepiride (AMARYL) 1 MG tablet Take 0.5 tablets by mouth in the morning and at bedtime.   . insulin glargine (LANTUS) 100 UNIT/ML Solostar Pen Inject 10 units at HS may increase to max 40 units per day based upon blood sugars  . isosorbide mononitrate (IMDUR) 30 MG 24 hr tablet Take 30 mg by mouth daily.  Marland Kitchen levothyroxine (SYNTHROID, LEVOTHROID) 75 MCG tablet Take 75 mcg by mouth daily.  Marland Kitchen loratadine (CLARITIN) 10 MG tablet as needed.  . metFORMIN (GLUCOPHAGE-XR) 500 MG 24 hr tablet Take 500 mg by mouth 2 (two) times daily.  . metoprolol tartrate (LOPRESSOR) 50 MG tablet Take 50 mg by mouth 2 (two) times daily.  . montelukast (SINGULAIR) 10 MG tablet Take 10 mg by mouth at bedtime.  Marland Kitchen MYRBETRIQ 50 MG TB24 tablet Take 50 mg by mouth daily.  . nitroGLYCERIN (NITRO-BID) 2 % ointment Apply 1 inch topically at bedtime.  . nitroGLYCERIN (NITROSTAT) 0.4 MG SL tablet TAKE 1 TABLET UNDER THE TONGUE AS NEEDEDFOR CHEST PAIN ( MAY REPEAT EVERY 5 MINUTES X 3)  . PREMARIN vaginal cream 3 (three) times a week.  . SYMBICORT 160-4.5 MCG/ACT inhaler Inhale 2 puffs into the lungs  2 (two) times daily.  . traMADol (ULTRAM) 50 MG tablet Take 50 mg by mouth every 6 (six) hours as needed.     Allergies:   Doxycycline, Nitrofurantoin, Statins, Sulfamethoxazole, and Trimethoprim   Social History   Socioeconomic History  . Marital status: Widowed    Spouse name: Not on file  . Number of children: Not on file  . Years of education: Not  on file  . Highest education level: Not on file  Occupational History  . Not on file  Tobacco Use  . Smoking status: Never Smoker  . Smokeless tobacco: Current User    Types: Snuff  Vaping Use  . Vaping Use: Never used  Substance and Sexual Activity  . Alcohol use: No  . Drug use: No  . Sexual activity: Not on file  Other Topics Concern  . Not on file  Social History Narrative  . Not on file   Social Determinants of Health   Financial Resource Strain: Not on file  Food Insecurity: Not on file  Transportation Needs: Not on file  Physical Activity: Not on file  Stress: Not on file  Social Connections: Not on file     Family History: The patient's family history includes Cancer in her sister; Diabetes in her mother; Hypertension in her father and mother. ROS:   Please see the history of present illness.    All other systems reviewed and are negative.  EKGs/Labs/Other Studies Reviewed:    The following studies were reviewed today:  Recent Labs: 02/08/2020: B Natriuretic Peptide 76.3 07/13/2020: ALT 17; BUN 15; Creatinine, Ser 0.89; Hemoglobin 12.0; NT-Pro BNP 191; Platelets 341; Potassium 3.7; Sodium 136  Recent Lipid Panel    Component Value Date/Time   CHOL 178 07/13/2020 0946   TRIG 133 07/13/2020 0946   HDL 66 07/13/2020 0946   CHOLHDL 2.7 07/13/2020 0946   LDLCALC 89 07/13/2020 0946    Physical Exam:    VS:  BP 130/80 (BP Location: Right Arm, Patient Position: Sitting, Cuff Size: Normal)   Pulse 62   Ht _0  (1.626 m)   Wt 166 lb (75.3 kg)   SpO2 95%   BMI 28.49 kg/m     Wt Readings from Last 3 Encounters:  09/12/20 166 lb (75.3 kg)  07/13/20 165 lb (74.8 kg)  03/21/20 161 lb (73 kg)     GEN:  Well nourished, well developed in no acute distress HEENT: Normal NECK: No JVD; No carotid bruits LYMPHATICS: No lymphadenopathy CARDIAC: RRR, no murmurs, rubs, gallops RESPIRATORY:  Clear to auscultation without rales, wheezing or rhonchi  ABDOMEN:  Soft, non-tender, non-distended MUSCULOSKELETAL:  No edema; No deformity  SKIN: Warm and dry NEUROLOGIC:  Alert and oriented x 3 PSYCHIATRIC:  Normal affect    Signed, Shirlee More, MD  09/12/2020 1:04 PM    [facility]

## 2020-09-12 NOTE — Addendum Note (Signed)
Addended by: Jerl Santos R on: 09/12/2020 01:31 PM   Modules accepted: Orders

## 2020-09-14 ENCOUNTER — Other Ambulatory Visit: Payer: Self-pay

## 2020-09-14 MED ORDER — ISOSORBIDE MONONITRATE ER 30 MG PO TB24: 30.0000 mg | ORAL_TABLET | Freq: Every day | ORAL | 3 refills | Status: AC

## 2020-09-14 MED ORDER — FUROSEMIDE 40 MG PO TABS: 40.0000 mg | ORAL_TABLET | Freq: Two times a day (BID) | ORAL | 3 refills | Status: DC

## 2020-09-14 MED ORDER — NITROGLYCERIN 0.4 MG SL SUBL: 0.4000 mg | SUBLINGUAL_TABLET | SUBLINGUAL | 11 refills | Status: DC | PRN

## 2020-09-14 NOTE — Telephone Encounter (Signed)
Refill of Furosemide 40 mg, Imdur 30 mg, and Nitroglycerin 0.4 mg to [facility].

## 2020-09-18 ENCOUNTER — Ambulatory Visit: Payer: Medicare Other | Admitting: Cardiology

## 2020-10-01 NOTE — Telephone Encounter (Signed)
Pharmacy is stating that the pt is needing name brand of Lasix in order to fill rx, please send new rx for pt

## 2020-10-10 ENCOUNTER — Telehealth: Payer: Self-pay | Admitting: Cardiology

## 2020-10-10 MED ORDER — PRALUENT 150 MG/ML ~~LOC~~ SOAJ
150.0000 mg | SUBCUTANEOUS | 3 refills | Status: DC
Start: 2020-10-10 — End: 2021-11-11

## 2020-10-10 NOTE — Telephone Encounter (Signed)
*  STAT* If patient is at the pharmacy, call can be transferred to refill team.   1. Which medications need to be refilled? (please list name of each medication and dose if known) Alirocumab (PRALUENT) 150 MG/ML SOAJ  2. Which pharmacy/location (including street and city if local pharmacy) is medication to be sent to?[facility] [place], [place]  3. Do they need a 30 day or 90 day supply? [place]

## 2020-10-10 NOTE — Telephone Encounter (Signed)
Refill sent in per request.  

## 2020-11-15 DIAGNOSIS — M21611 Bunion of right foot: Secondary | ICD-10-CM

## 2020-11-15 HISTORY — DX: Bunion of right foot: M21.611

## 2020-12-08 IMAGING — DX DG CHEST 2V
2 series · 2 of 2 positions shown · non-contrast
Comparison: None.

CLINICAL DATA: 87-year-old female with chest pain.

EXAM:
CHEST - 2 VIEW

[chest lat]
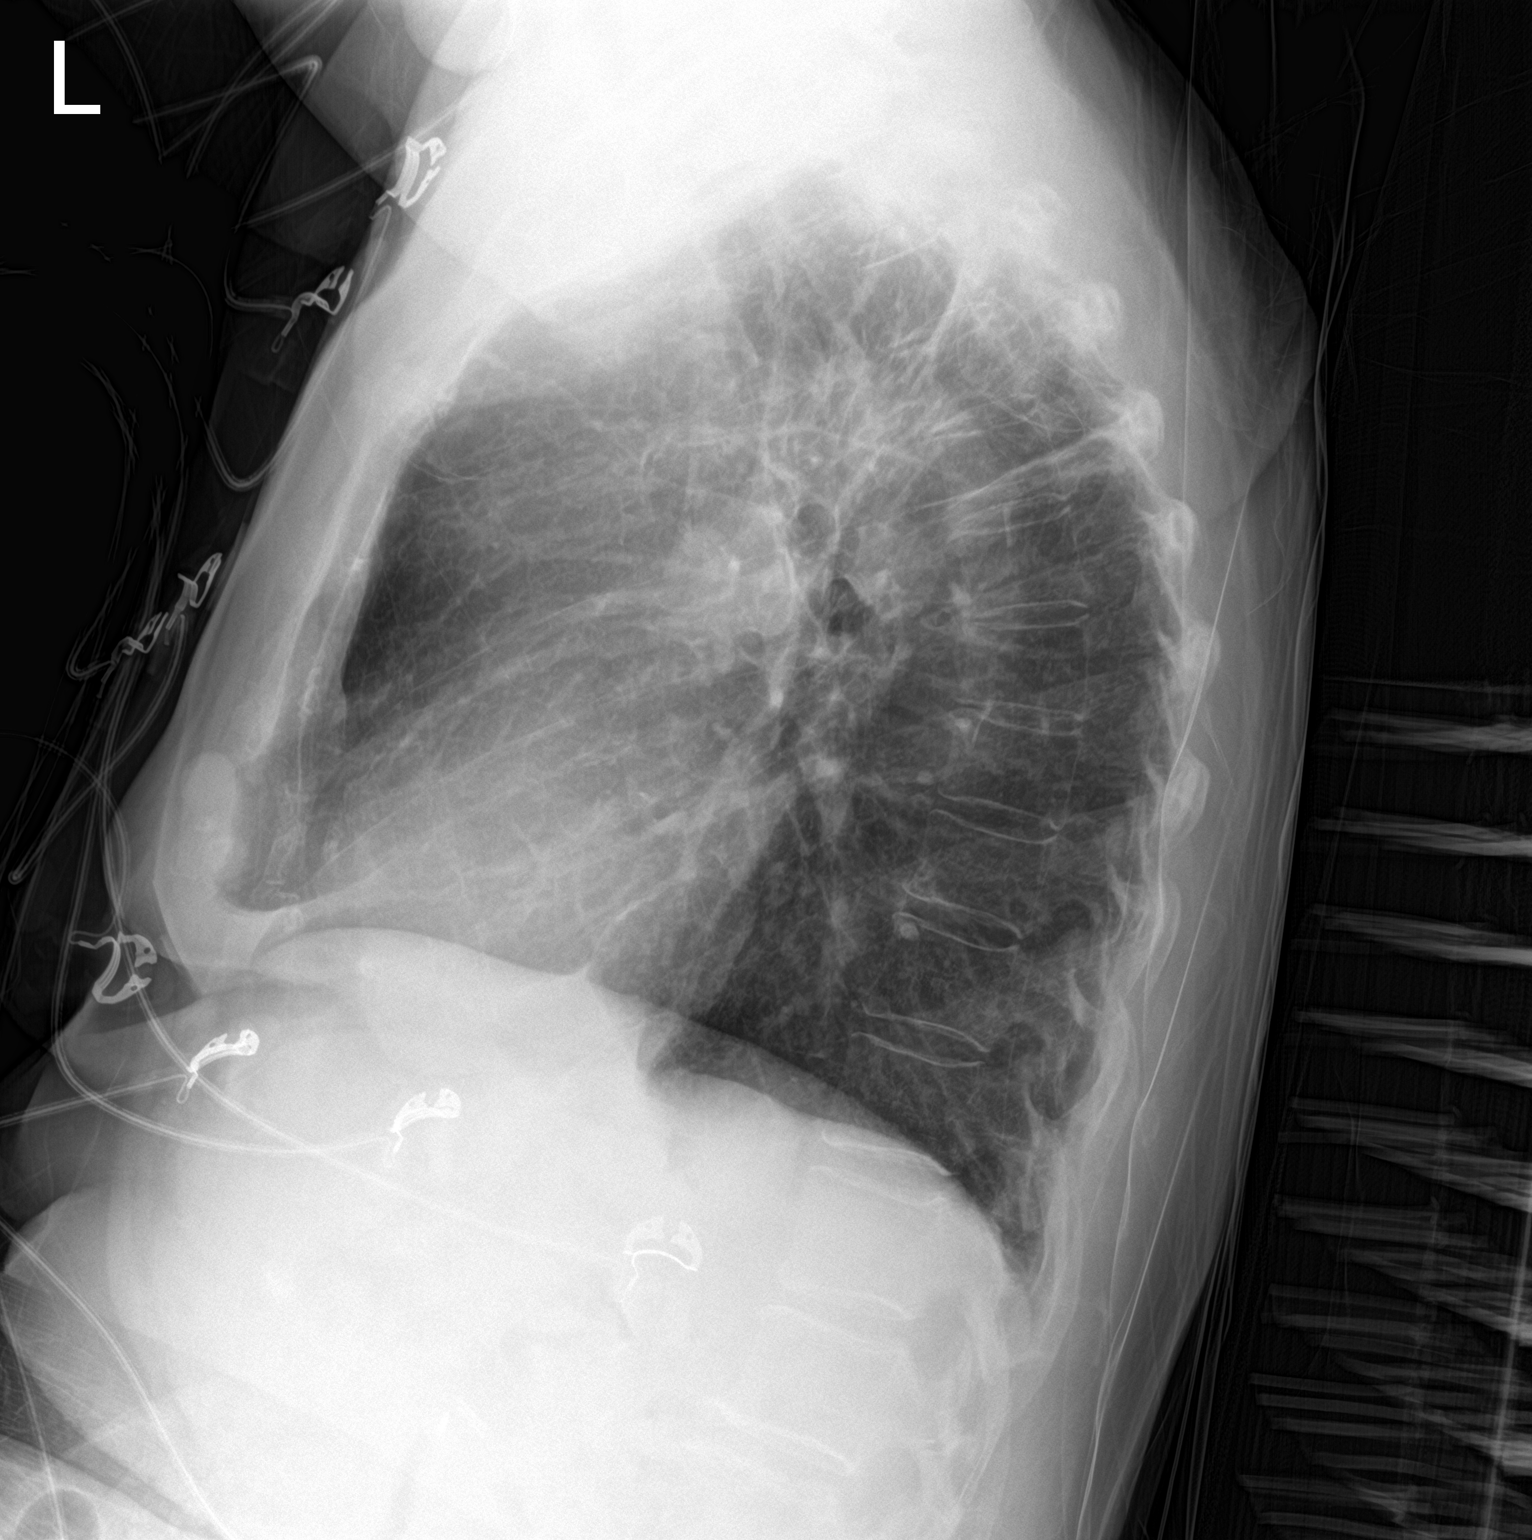

[chest ap]
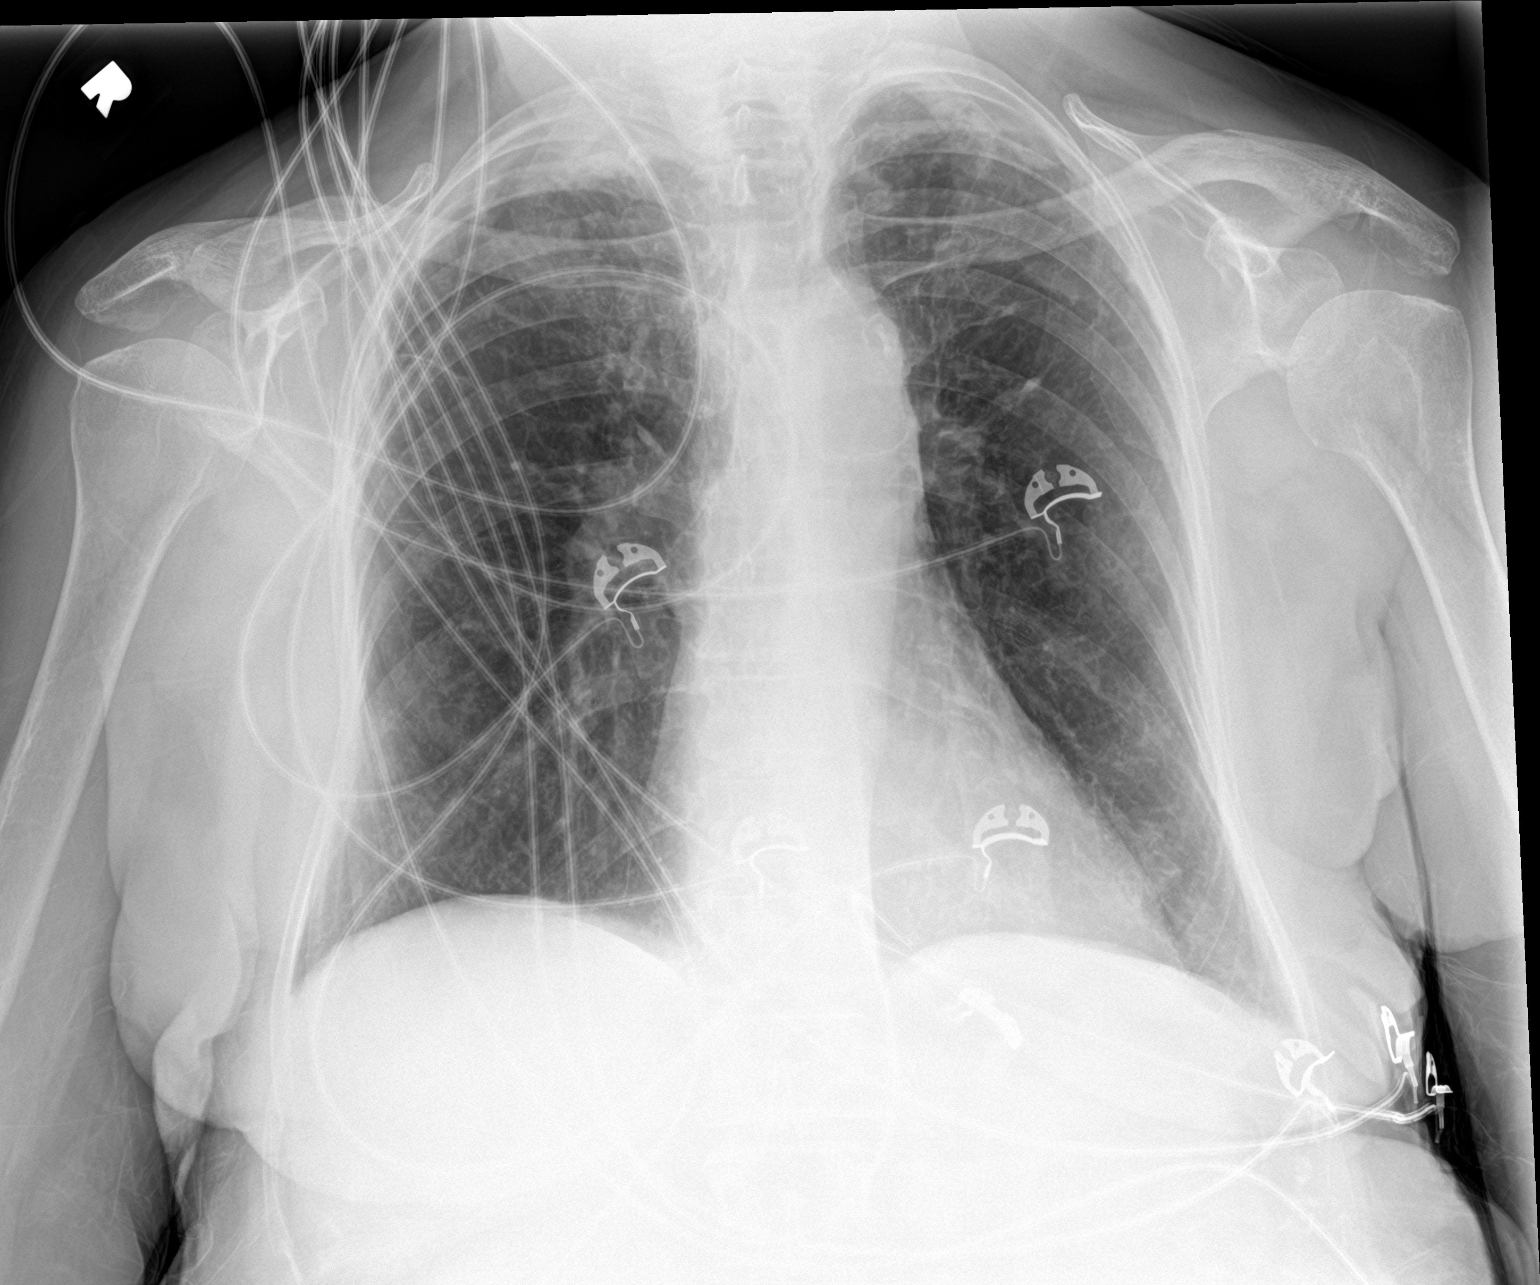

[2 of 2 positions shown; findings below may reference images not displayed]

FINDINGS: There are biapical subpleural scarring and reticulation. Bilateral
upper lobe/biapical streaky and nodular densities similar to the
prior radiograph, chronic. No new consolidation. There is no pleural
effusion or pneumothorax. The cardiac silhouette is within limits.
Atherosclerotic calcification of the aorta. No acute osseous
pathology.
IMPRESSION: No acute cardiopulmonary process.

## 2021-01-04 ENCOUNTER — Telehealth: Payer: Self-pay | Admitting: Cardiology

## 2021-01-04 MED ORDER — NITROGLYCERIN 0.4 MG SL SUBL: 0.4000 mg | SUBLINGUAL_TABLET | SUBLINGUAL | 11 refills | Status: DC | PRN

## 2021-01-04 NOTE — Telephone Encounter (Signed)
RX sent

## 2021-01-04 NOTE — Telephone Encounter (Signed)
*  STAT* If patient is at the pharmacy, call can be transferred to refill team.   1. Which medications need to be refilled? (please list name of each medication and dose if known)  nitroGLYCERIN (NITROSTAT) 0.4 MG SL tablet  2. Which pharmacy/location (including street and city if local pharmacy) is medication to be sent to? [place], [place]  3. Do they need a 30 day or 90 day supply? 90 day supply

## 2021-03-11 ENCOUNTER — Encounter: Payer: Self-pay | Admitting: Cardiology

## 2021-03-11 ENCOUNTER — Ambulatory Visit: Payer: Medicare Other | Admitting: Cardiology

## 2021-03-11 ENCOUNTER — Other Ambulatory Visit: Payer: Self-pay

## 2021-03-11 VITALS — BP 154/73 | HR 64 | Ht 64.0 in | Wt 166.0 lb

## 2021-03-11 DIAGNOSIS — G903 Multi-system degeneration of the autonomic nervous system: Secondary | ICD-10-CM | POA: Diagnosis not present

## 2021-03-11 DIAGNOSIS — I110 Hypertensive heart disease with heart failure: Secondary | ICD-10-CM

## 2021-03-11 DIAGNOSIS — N183 Chronic kidney disease, stage 3 unspecified: Secondary | ICD-10-CM

## 2021-03-11 DIAGNOSIS — I25118 Atherosclerotic heart disease of native coronary artery with other forms of angina pectoris: Secondary | ICD-10-CM | POA: Diagnosis not present

## 2021-03-11 DIAGNOSIS — E1122 Type 2 diabetes mellitus with diabetic chronic kidney disease: Secondary | ICD-10-CM

## 2021-03-11 DIAGNOSIS — E782 Mixed hyperlipidemia: Secondary | ICD-10-CM

## 2021-03-11 DIAGNOSIS — Z789 Other specified health status: Secondary | ICD-10-CM

## 2021-03-11 NOTE — Progress Notes (Signed)
Cardiology Office Note:    Date:  03/11/2021   ID:  Wendy Leon, DOB October 18, 1932, MRN [id number]  PCP:  Raina Mina., MD  Cardiologist:  Shirlee More, MD    Referring MD: Raina Mina., MD    ASSESSMENT:    1. Coronary artery disease of native artery of native heart with stable angina pectoris ([place])   2. Hypertensive heart disease with heart failure ([place])   3. Neurogenic orthostatic hypotension (HCC)   4. CKD stage 3 due to type 2 diabetes mellitus ([place])   5. Mixed hyperlipidemia   6. Statin intolerance    PLAN:    In order of problems listed above:  Wendy Leon is done remarkably well presently asymptomatic with her CAD having no angina on current medical therapy including aspirin oral nitrate topical nitroglycerin at bedtime and her beta-blocker. Stable BP is in adequate range we do not try to tightly control her hypertension because of previous severe orthostatic hypotension related to her diabetic neuropathy.  Continue her current treatment including her loop diuretic and ARB Stable no clinical recurrence of orthostatic syncope Stable CKD Good response to PCSK9 inhibitor she is  statin intolerant LDL is at target   Next appointment: 6 months   Medication Adjustments/Labs and Tests Ordered: Current medicines are reviewed at length with the patient today.  Concerns regarding medicines are outlined above.  No orders of the defined types were placed in this encounter.  No orders of the defined types were placed in this encounter.   Chief Complaint  Patient presents with   Follow-up   Coronary Artery Disease   Congestive Heart Failure   Hypotension   Hyperlipidemia    And statin intolerance   .[facility]  History of Present Illness:    Wendy Leon is a 87 y.o. female with a hx of CAD hypertensive heart disease with heart failure hyperlipidemia and statin intolerance and stage III CKD with diabetes mellitus and orthostatic hypotension.  Cardiac CTA showed 60%  proximal LAD stenosis although FFR is diminished and she was medically treated her choice in the context of multiple severe comorbidities.  She was last seen 09/12/2020.  Compliance with diet, lifestyle and medications: Yes  She has done remarkably well since we started using nocturnal nitroglycerin paste she has had no further chest pain. She has had no orthostatic lightheadedness edema shortness of breath palpitation or syncope She enjoys life and is pleased with the quality of her life. She prayed for her hair to be restored and its come back and is very curly they have never been that way in her life.  Recent labs 01/30/2022 [facility] [address] PCP Cholesterol 173 LDL 88 triglycerides 179 HDL 59 CMP normal including liver function test hemoglobin 12.4 and Past Medical History:  Diagnosis Date   Abnormal gait 06/07/2009   Last Assessment & Plan:  She saw specialist who has given her orders for PT and she is wanting to have it setup for her at [facility] where she already is receiving care from at home   Acid reflux 02/20/2014   Aftercare following surgery 11/27/2015   Anemia of unknown etiology 10/18/2016   Anxiety 02/20/2014   Atrial fibrillation ([place])    CAD (coronary artery disease)    Cerebral ischemia 03/11/2016   Overview:  Microvascular ischemia seen on MRI 01/2015 with partial empty sella turcica neurology appt 03/12/2016 pending with [facility], also [facility] with [facility] coming to the house for her gait abnormality and  lumbar DDD, as well as local ortho ([facility])    Chest pain 11/03/2018   CHF (congestive heart failure) ([place]) 06/24/2015   Last Assessment & Plan:  Relevant Hx: Course: Daily Update: Today's Plan:she has been doing well overall from her heart and no SOB  Electronically signed by: Mayer Camel, NP 06/26/15 1522   Chronic diastolic heart failure ([place]) 06/24/2015   Last Assessment & Plan:  Relevant Hx: Course: Daily Update: Today's Plan:she has been doing well  overall from her heart and no SOB  Electronically signed by: Mayer Camel, NP 06/26/15 1522   Chronic UTI 06/27/2015   Last Assessment & Plan:  She requested a UA be checked for her as she was having more frequency than before and it has been awhile since she was on oral abx for this, she is on low dose maintenance med to prevent and has done well with that.   CKD stage 3 due to type 2 diabetes mellitus ([place]) 02/24/2018   Colon polyps    COPD (chronic obstructive pulmonary disease) ([place]) 03/09/2012   Last Assessment & Plan:  She feels she is stable from this discussed with her the allergies she has and she is going to discuss with Dr. Alcide Clever having allergy injections as well for this    Diabetes ([place]) 02/20/2014   Dizziness 02/07/2016   Overview:  Chronic and has been having falls, MRI showing chronic microvascular ischemia, as well as partial sella turcica pending neuro appt for her with [facility]   Dyslipidemia 07/29/2015   Edema 06/24/2015   Last Assessment & Plan:  Relevant Hx: Course: Daily Update: Today's Plan:this is stable for her overall and will follow for her  Electronically signed by: Mayer Camel, NP 06/27/15 0813   Elevated cholesterol    Essential hypertension 03/09/2012   Last Assessment & Plan:  On repeat this was stable for her and she does not check it at home much but when has been checked has been fine for her   Fistula 02/20/2014   Gastroesophageal reflux disease without esophagitis 02/20/2014   Last Assessment & Plan:  Relevant Hx: Course: Daily Update: Today's Plan:not on meds for this as she has had issues with her arms cramping and she is going to just drink buttermilk  Electronically signed by: Mayer Camel, NP 06/26/15 1530   Generalized abdominal pain 10/24/2016   Hallux valgus of left foot 12/04/2015   Hallux valgus of right foot 12/04/2015   High risk medication use 06/24/2015   History of recurrent UTIs 06/15/2019   Hx of valvular heart  disease 01/29/2016   Hypertension 02/20/2014   Hypertensive heart disease with heart failure ([place]) 03/09/2012   Last Assessment & Plan:  On repeat this was stable for her and she does not check it at home much but when has been checked has been fine for her   Hypothyroidism (acquired) 03/09/2012   Last Assessment & Plan:  She was due for this to be updated and have advised her to get this drawn while here today   IBS (irritable bowel syndrome) 06/24/2015   Last Assessment & Plan:  Relevant Hx: Course: Daily Update: Today's Plan:this is stable for her  Electronically signed by: Mayer Camel, NP 06/26/15 1531   IBS (irritable bowel syndrome)    Idiopathic progressive polyneuropathy 06/07/2009   Left bundle branch block 06/24/2015   Leukocytosis 06/24/2015   Last Assessment & Plan:  Relevant Hx: Course: Daily Update: Today's Plan:this has been stable  for her with FU through hematology who felt she was having benign swings in this and she chose to not have bone marrow biopsy done   Electronically signed by: Mayer Camel, NP 06/27/15 [phone]   Liposarcoma, 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 Ralene Cork [facility]), 8 x 6 x 3 cm. Local recurrence, re-excision 03/23/2007 [facility]), 5 x 3 x 2 cm. Surveillance plan: baseline MRI April 2014  Last Assessment & Plan:  Relevant Hx: Course: Daily Update: Today's Plan:she is stable from this  Electronically signed by: Mayer Camel, NP 06/26/15 1533   Lumbar degenerative disc disease 11/06/2015   Last Assessment & Plan:  At this point she needs updated MRI of her lumbar spine and we discussed with her gait abnormality she is likely going to have to see specialist at [facility] for this, she has seen local neuro for this several years ago in terms of her neuropathy and her falls and he really did not address it . She was advised several years ago to have back surgery locally  which we did not feel was ideal with her sugars at the time being so out of control and now she is really more in control than then and it may be doable for her if felt  Needed though she would have to have cardiology clear her as well.   Mild CAD 07/29/2015   Overview:  Overview:  Cath march 2016 with 60% LAD with normal FFR   Overview:  Cath march 2016 with 60% LAD with normal FFR    Mixed hyperlipidemia 06/24/2015   Last Assessment & Plan:  Relevant Hx: Course: Daily Update: Today's Plan:update her lipids for her today and she is trying to watch her diet   Electronically signed by: Mayer Camel, NP 06/27/15 0813   Obstructive sleep apnea syndrome 06/24/2015   Onychomycosis due to dermatophyte 11/27/2015   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   Last Assessment & Plan:  Relevant Hx: Course: Daily Update: Today's Plan:she is doing well overall  Electronically signed by: Mayer Camel, NP 06/26/15 1531   Pancreatitis    Pre-ulcerative calluses 12/04/2015   Pre-ulcerative corn or callous 11/27/2015   Primary osteoarthritis involving multiple joints 07/06/2015   Last Assessment & Plan:  Relevant Hx: Course: Daily Update: Today's Plan:she has DDD of her back with some spinal  Stenosis that she has seen ortho locally for and he wanted to do surgery which she has avoided and I agree with, she is going to use the flexeril for this right now as she is having more muscle spasm than what she has had in the past from her attempt to walk down some very steep steps that she was not used to . She should use her walker for support through the weekend and avoid overhead strain and lifting and stay in touch if not better.  Electronically signed by: Mayer Camel, NP 07/06/15 [phone]   Seasonal allergic rhinitis 01/12/2019   Sleep apnea    Stress incontinence of urine 04/17/2016   Syncope and collapse 10/13/2016   Thyroid disease    TIA (transient ischemic attack) 10/13/2016   Type 2 diabetes mellitus  without complication, without long-term current use of insulin ([place]) 03/09/2012   Last Assessment & Plan:  She brings in readings of her sugars which show she has been doing much better overall with this at home and quite pleased with her   UTI (urinary tract infection)  Vascular calcification 01/29/2016   Visual changes 01/29/2016   Overview:  Following with Dr. Marina Gravel as well as had recent carotid doppler showing minimal plaque present and vertebral arteries normal , nothing signficant   Vitamin B12 deficiency 12/04/2015    Past Surgical History:  Procedure Laterality Date   ABDOMINAL HYSTERECTOMY     CHOLECYSTECTOMY     EYE SURGERY     PANCREAS SURGERY     skin cancer lesion      Current Medications: Current Meds  Medication Sig   albuterol (PROVENTIL) (2.5 MG/3ML) 0.083% nebulizer solution Take 2.5 mg by nebulization every 4 (four) hours as needed for wheezing or shortness of breath.   albuterol (VENTOLIN HFA) 108 (90 Base) MCG/ACT inhaler Inhale 2 puffs into the lungs every 4 (four) hours as needed.   Alirocumab (PRALUENT) 150 MG/ML SOAJ Inject 150 mg into the skin every 14 (fourteen) days.   ALPRAZolam (XANAX) 0.5 MG tablet Take 0.5 mg by mouth 3 (three) times daily as needed for anxiety.    amLODipine (NORVASC) 5 MG tablet Take 2.5 mg by mouth daily.    ascorbic acid (VITAMIN C) 1000 MG tablet Take 1,000 mg by mouth daily.   aspirin EC 81 MG tablet Take 81 mg by mouth daily.   benazepril (LOTENSIN) 20 MG tablet Take 20 mg by mouth daily.   Blood Glucose Monitoring Suppl (ACCU-CHEK AVIVA PLUS) w/Device KIT See admin instructions.   Cholecalciferol 25 MCG (1000 UT) tablet Take 1,000 Units by mouth daily.   cyanocobalamin (,VITAMIN B-12,) 1000 MCG/ML injection Inject into the muscle every 30 (thirty) days.   D-MANNOSE PO Take 1 tablet by mouth daily.   donepezil (ARICEPT) 5 MG tablet Take 5 mg by mouth at bedtime.   fluticasone (FLONASE) 50 MCG/ACT nasal spray Place 1 spray into  the nose 2 (two) times daily.   furosemide (LASIX) 40 MG tablet Take 1 tablet (40 mg total) by mouth 2 (two) times daily.   gabapentin (NEURONTIN) 300 MG capsule Take 300 mg by mouth daily.   glimepiride (AMARYL) 1 MG tablet Take 0.5 tablets by mouth in the morning and at bedtime.    insulin glargine (LANTUS) 100 UNIT/ML Solostar Pen Inject 10 units at HS may increase to max 40 units per day based upon blood sugars   isosorbide mononitrate (IMDUR) 30 MG 24 hr tablet Take 1 tablet (30 mg total) by mouth daily.   levothyroxine (SYNTHROID, LEVOTHROID) 75 MCG tablet Take 75 mcg by mouth daily.   loratadine (CLARITIN) 10 MG tablet Take 10 mg by mouth daily as needed for allergies.   metFORMIN (GLUCOPHAGE-XR) 500 MG 24 hr tablet Take 500 mg by mouth 2 (two) times daily.   metoprolol tartrate (LOPRESSOR) 50 MG tablet Take 50 mg by mouth 2 (two) times daily.   montelukast (SINGULAIR) 10 MG tablet Take 10 mg by mouth at bedtime.   MYRBETRIQ 50 MG TB24 tablet Take 50 mg by mouth daily.   nitroGLYCERIN (NITROSTAT) 0.4 MG SL tablet Place 1 tablet (0.4 mg total) under the tongue every 5 (five) minutes as needed for chest pain.   PREMARIN vaginal cream 3 (three) times a week.   SYMBICORT 160-4.5 MCG/ACT inhaler Inhale 2 puffs into the lungs 2 (two) times daily.   traMADol (ULTRAM) 50 MG tablet Take 50 mg by mouth every 6 (six) hours as needed.     Allergies:   Doxycycline, Nitrofurantoin, Statins, Sulfamethoxazole, and Trimethoprim   Social History   Socioeconomic History  Marital status: Widowed    Spouse name: Not on file   Number of children: Not on file   Years of education: Not on file   Highest education level: Not on file  Occupational History   Not on file  Tobacco Use   Smoking status: Never   Smokeless tobacco: Current    Types: Snuff  Vaping Use   Vaping Use: Never used  Substance and Sexual Activity   Alcohol use: No   Drug use: No   Sexual activity: Not on file  Other Topics  Concern   Not on file  Social History Narrative   Not on file   Social Determinants of Health   Financial Resource Strain: Not on file  Food Insecurity: Not on file  Transportation Needs: Not on file  Physical Activity: Not on file  Stress: Not on file  Social Connections: Not on file     Family History: The patient's family history includes Cancer in her sister; Diabetes in her mother; Hypertension in her father and mother. ROS:   Please see the history of present illness.    All other systems reviewed and are negative.  EKGs/Labs/Other Studies Reviewed:    The following studies were reviewed today:    Recent Labs: 07/13/2020: ALT 17; BUN 15; Creatinine, Ser 0.89; Hemoglobin 12.0; NT-Pro BNP 191; Platelets 341; Potassium 3.7; Sodium 136  Recent Lipid Panel    Component Value Date/Time   CHOL 178 07/13/2020 0946   TRIG 133 07/13/2020 0946   HDL 66 07/13/2020 0946   CHOLHDL 2.7 07/13/2020 0946   LDLCALC 89 07/13/2020 0946    Physical Exam:    VS:  BP (!) 154/73   Pulse 64   Ht $R'5\' 4"'gs$  (1.626 m)   Wt 166 lb (75.3 kg)   SpO2 98%   BMI 28.49 kg/m     Wt Readings from Last 3 Encounters:  03/11/21 166 lb (75.3 kg)  09/12/20 166 lb (75.3 kg)  07/13/20 165 lb (74.8 kg)     GEN:  Well nourished, well developed in no acute distress HEENT: Normal NECK: No JVD; No carotid bruits LYMPHATICS: No lymphadenopathy CARDIAC: RRR, no murmurs, rubs, gallops RESPIRATORY:  Clear to auscultation without rales, wheezing or rhonchi  ABDOMEN: Soft, non-tender, non-distended MUSCULOSKELETAL:  No edema; No deformity  SKIN: Warm and dry NEUROLOGIC:  Alert and oriented x 3 PSYCHIATRIC:  Normal affect    Signed, Shirlee More, MD  03/11/2021 10:58 AM    [place]

## 2021-03-11 NOTE — Patient Instructions (Signed)

## 2021-04-22 ENCOUNTER — Other Ambulatory Visit: Payer: Self-pay | Admitting: Cardiology

## 2021-04-23 ENCOUNTER — Other Ambulatory Visit: Payer: Self-pay | Admitting: Cardiology

## 2021-04-24 ENCOUNTER — Telehealth: Payer: Self-pay | Admitting: Cardiology

## 2021-04-24 MED ORDER — NITROGLYCERIN 0.4 MG SL SUBL: SUBLINGUAL_TABLET | SUBLINGUAL | 3 refills | Status: DC

## 2021-04-24 NOTE — Telephone Encounter (Signed)
°*  STAT* If patient is at the pharmacy, call can be transferred to refill team.   1. Which medications need to be refilled? (please list name of each medication and dose if known)  new prescription for nitro bid  2. Which pharmacy/location (including street and city if local pharmacy) is medication to be sent to?[facility]   [address]  3. Do they need a 30 day or 90 day supply?

## 2021-04-25 ENCOUNTER — Telehealth: Payer: Self-pay | Admitting: Cardiology

## 2021-04-25 NOTE — Telephone Encounter (Signed)
°*  STAT* If patient is at the pharmacy, call can be transferred to refill team.   1. Which medications need to be refilled? (please list name of each medication and dose if known)  NITRO-BID CREAM  2. Which pharmacy/location (including street and city if local pharmacy) is medication to be sent to? [place], [place]  3. Do they need a 30 day or 90 day supply?

## 2021-04-26 ENCOUNTER — Other Ambulatory Visit: Payer: Self-pay

## 2021-04-26 MED ORDER — NITROGLYCERIN 0.4 MG SL SUBL: SUBLINGUAL_TABLET | SUBLINGUAL | 3 refills | Status: DC

## 2021-04-26 MED ORDER — NITRO-BID 2 % TD OINT: 1.0000 [in_us] | TOPICAL_OINTMENT | Freq: Every evening | TRANSDERMAL | 3 refills | Status: DC

## 2021-07-27 ENCOUNTER — Other Ambulatory Visit: Payer: Self-pay | Admitting: Cardiology

## 2021-08-13 ENCOUNTER — Other Ambulatory Visit: Payer: Self-pay | Admitting: Cardiology

## 2021-09-07 NOTE — Progress Notes (Signed)
?Cardiology Office Note:   ? ?Date:  09/09/2021  ? ?ID:  Wendy Leon, DOB 16-Jul-1932, MRN [id number] ? ?PCP:  Raina Mina., MD  ?Cardiologist:  Shirlee More, MD   ? ?Referring MD: Raina Mina., MD  ? ? ?ASSESSMENT:   ? ?1. Coronary artery disease of native artery of native heart with stable angina pectoris ([place])   ?2. Hypertensive heart disease with heart failure ([place])   ?3. Neurogenic orthostatic hypotension ([facility])   ?4. Mixed hyperlipidemia   ?5. Statin intolerance   ? ?PLAN:   ? ?In order of problems listed above: ? ?Marley continues to do well with CAD with a good medical regimen including aspirin oral and topical nitroglycerin and beta-blocker having no anginal discomfort New York Heart Association class I ?Stable no fluid overload continue her loop diuretic and I would not try to tighten blood pressure control with her previous severe symptomatic hypotension ?Lipids at target continue treatment including PCSK9 inhibitor ? ? ?Next appointment: 6 months ? ? ?Medication Adjustments/Labs and Tests Ordered: ?Current medicines are reviewed at length with the patient today.  Concerns regarding medicines are outlined above.  ?Orders Placed This Encounter  ?Procedures  ? EKG 12-Lead  ? ?No orders of the defined types were placed in this encounter. ? ? ?Chief Complaint  ?Patient presents with  ? Follow-up  ? Coronary Artery Disease  ? ? ?History of Present Illness:   ? ?Wendy Leon is a 88 y.o. female with a hx of CAD hypertensive heart disease with heart failure hyperlipidemia and statin intolerance and stage III CKD with diabetes mellitus and orthostatic hypotension.  Cardiac CTA showed 60% proximal LAD stenosis although FFR is diminished and she was medically treated her choice in the context of multiple severe comorbidities and she wants last seen 03/11/2021. ?Compliance with diet, lifestyle and medications: Yes ? ?She continues to do well she has had no anginal discomfort symptomatic hypotension edema shortness  of breath palpitation or syncope ?I reviewed her recent labs with her. ? ?Recent labs 08/13/2021 [facility] PCP: ?Sodium 137 potassium 3.6 creatinine 1.12 GFR 48 cc cholesterol 177 LDL 86 triglycerides 172 HDL 61 non-HDL cholesterol 116 hemoglobin 12.2 ?Past Medical History:  ?Diagnosis Date  ? Abnormal gait 06/07/2009  ? Last Assessment & Plan:  She saw specialist who has given her orders for PT and she is wanting to have it setup for her at [facility] where she already is receiving care from at home  ? Acid reflux 02/20/2014  ? Aftercare following surgery 11/27/2015  ? Anemia of unknown etiology 10/18/2016  ? Anxiety 02/20/2014  ? Atrial fibrillation ([place])   ? CAD (coronary artery disease)   ? Cerebral ischemia 03/11/2016  ? Overview:  Microvascular ischemia seen on MRI 01/2015 with partial empty sella turcica neurology appt 03/12/2016 pending with [facility], also has [facility] with PT coming to the house for her gait abnormality and lumbar DDD, as well as local ortho (d[place])   ? Chest pain 11/03/2018  ? CHF (congestive heart failure) ([place]) 06/24/2015  ? Last Assessment & Plan:  Relevant Hx: Course: Daily Update: Today's Plan:she has been doing well overall from her heart and no SOB  Electronically signed by: Mayer Camel, NP 06/26/15 1522  ? Chronic diastolic heart failure ([place]) 06/24/2015  ? Last Assessment & Plan:  Relevant Hx: Course: Daily Update: Today's Plan:she has been doing well overall from her heart and no SOB  Electronically signed  by: Mayer Camel, NP 06/26/15 1522  ? Chronic UTI 06/27/2015  ? Last Assessment & Plan:  She requested a UA be checked for her as she was having more frequency than before and it has been awhile since she was on oral abx for this, she is on low dose maintenance med to prevent and has done well with that.  ? CKD stage 3 due to type 2 diabetes mellitus ([place]) 02/24/2018  ? Colon polyps   ? COPD (chronic obstructive pulmonary disease)  ([place]) 03/09/2012  ? Last Assessment & Plan:  She feels she is stable from this discussed with her the allergies she has and she is going to discuss with Dr. Alcide Clever having allergy injections as well for this   ? Diabetes ([place]) 02/20/2014  ? Dizziness 02/07/2016  ? Overview:  Chronic and has been having falls, MRI showing chronic microvascular ischemia, as well as partial sella turcica pending neuro appt for her with [facility]  ? Dyslipidemia 07/29/2015  ? Edema 06/24/2015  ? Last Assessment & Plan:  Relevant Hx: Course: Daily Update: Today's Plan:this is stable for her overall and will follow for her  Electronically signed by: Mayer Camel, NP 06/27/15 0813  ? Elevated cholesterol   ? Essential hypertension 03/09/2012  ? Last Assessment & Plan:  On repeat this was stable for her and she does not check it at home much but when has been checked has been fine for her  ? Fistula 02/20/2014  ? Gastroesophageal reflux disease without esophagitis 02/20/2014  ? Last Assessment & Plan:  Relevant Hx: Course: Daily Update: Today's Plan:not on meds for this as she has had issues with her arms cramping and she is going to just drink buttermilk  Electronically signed by: Mayer Camel, NP 06/26/15 1530  ? Generalized abdominal pain 10/24/2016  ? Hallux valgus of left foot 12/04/2015  ? Hallux valgus of right foot 12/04/2015  ? High risk medication use 06/24/2015  ? History of recurrent UTIs 06/15/2019  ? Hx of valvular heart disease 01/29/2016  ? Hypertension 02/20/2014  ? Hypertensive heart disease with heart failure ([place]) 03/09/2012  ? Last Assessment & Plan:  On repeat this was stable for her and she does not check it at home much but when has been checked has been fine for her  ? Hypothyroidism (acquired) 03/09/2012  ? Last Assessment & Plan:  She was due for this to be updated and have advised her to get this drawn while here today  ? IBS (irritable bowel syndrome) 06/24/2015  ? Last Assessment & Plan:  Relevant Hx:  Course: Daily Update: Today's Plan:this is stable for her  Electronically signed by: Mayer Camel, NP 06/26/15 1531  ? IBS (irritable bowel syndrome)   ? Idiopathic progressive polyneuropathy 06/07/2009  ? Left bundle branch block 06/24/2015  ? Leukocytosis 06/24/2015  ? Last Assessment & Plan:  Relevant Hx: Course: Daily Update: Today's Plan:this has been stable for her with FU through hematology who felt she was having benign swings in this and she chose to not have bone marrow biopsy done   Electronically signed by: Mayer Camel, NP 06/27/15 [phone]  ? 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 Overview:  Initial excision 03/18/2000 (Dr. Ralene [facility]), 8 x 6 x 3 cm. Local recurrence, re-excision 03/23/2007 [facility]se), 5 x 3 x 2 cm. Surveillance plan: baseline MRI April 2014  Last Assessment & Plan:  Relevant Hx: Course: Daily Update: Today's Plan:she  is stable from this  Electronically signed by: Mayer Camel, NP 06/26/15 1533  ? Lumbar degenerative disc disease 11/06/2015  ? Last Assessment & Plan:  At this point she needs updated MRI of her lumbar spine and we discussed with her gait abnormality she is likely going to have to see specialist at [facility] for this, she has seen local neuro for this several years ago in terms of her neuropathy and her falls and he really did not address it . She was advised several years ago to have back surgery locally which we did not feel was ideal with her sugars at the time being so out of control and now she is really more in control than then and it may be doable for her if felt  Needed though she would have to have cardiology clear her as well.  ? Mild CAD 07/29/2015  ? Overview:  Overview:  Cath march 2016 with 60% LAD with normal FFR   Overview:  Cath march 2016 with 60% LAD with normal FFR   ? Mixed hyperlipidemia 06/24/2015  ? Last Assessment & Plan:  Relevant Hx: Course: Daily Update: Today's Plan:update  her lipids for her today and she is trying to watch her diet   Electronically signed by: Mayer Camel, NP 06/27/15 0813  ? Obstructive sleep apnea syndrome 06/24/2015  ? Onychomycosis due t

## 2021-09-09 ENCOUNTER — Ambulatory Visit: Payer: Medicare Other | Admitting: Cardiology

## 2021-09-09 ENCOUNTER — Encounter: Payer: Self-pay | Admitting: Cardiology

## 2021-09-09 VITALS — BP 160/82 | HR 54 | Ht 64.0 in | Wt 165.8 lb

## 2021-09-09 DIAGNOSIS — I110 Hypertensive heart disease with heart failure: Secondary | ICD-10-CM | POA: Diagnosis not present

## 2021-09-09 DIAGNOSIS — I25118 Atherosclerotic heart disease of native coronary artery with other forms of angina pectoris: Secondary | ICD-10-CM

## 2021-09-09 DIAGNOSIS — E782 Mixed hyperlipidemia: Secondary | ICD-10-CM | POA: Diagnosis not present

## 2021-09-09 DIAGNOSIS — G903 Multi-system degeneration of the autonomic nervous system: Secondary | ICD-10-CM

## 2021-09-09 DIAGNOSIS — Z789 Other specified health status: Secondary | ICD-10-CM

## 2021-09-09 NOTE — Patient Instructions (Signed)

## 2021-09-25 ENCOUNTER — Other Ambulatory Visit: Payer: Self-pay

## 2021-09-25 MED ORDER — FUROSEMIDE 40 MG PO TABS: 40.0000 mg | ORAL_TABLET | Freq: Two times a day (BID) | ORAL | 3 refills | Status: AC

## 2021-11-11 ENCOUNTER — Other Ambulatory Visit: Payer: Self-pay | Admitting: Cardiology

## 2021-12-31 ENCOUNTER — Other Ambulatory Visit: Payer: Self-pay | Admitting: Cardiology

## 2022-02-03 DIAGNOSIS — G8929 Other chronic pain: Secondary | ICD-10-CM | POA: Insufficient documentation

## 2022-02-03 DIAGNOSIS — M545 Low back pain, unspecified: Secondary | ICD-10-CM

## 2022-02-03 HISTORY — DX: Low back pain, unspecified: M54.50

## 2022-02-08 ENCOUNTER — Other Ambulatory Visit: Payer: Self-pay | Admitting: Cardiology

## 2022-02-10 NOTE — Telephone Encounter (Signed)
Spoke with patients daughter to clarify refill request received from [facility] for Nitostat sl tablets that was d/c at appointment with Dr Bettina Gavia 09/09/21 and changed to Nitro-Bid 2% ointment nightly at bedtime. Patient also takes Isosorbide mononitrate 30 mg daily. Explained to daughter that I would discuss with Dr Bettina Gavia if ok for patient to have both. Will notify daughter once reviewed with Dr Bettina Gavia. Daughter voices understanding and agrees.

## 2022-05-08 ENCOUNTER — Other Ambulatory Visit (HOSPITAL_COMMUNITY): Payer: Self-pay

## 2022-05-12 ENCOUNTER — Other Ambulatory Visit (HOSPITAL_COMMUNITY): Payer: Self-pay

## 2022-07-29 ENCOUNTER — Ambulatory Visit: Payer: Medicare Other | Attending: Cardiology | Admitting: Cardiology

## 2022-07-29 ENCOUNTER — Encounter: Payer: Self-pay | Admitting: Cardiology

## 2022-07-29 VITALS — BP 136/64 | HR 61 | Ht 64.0 in | Wt 160.4 lb

## 2022-07-29 DIAGNOSIS — E782 Mixed hyperlipidemia: Secondary | ICD-10-CM | POA: Diagnosis not present

## 2022-07-29 DIAGNOSIS — I110 Hypertensive heart disease with heart failure: Secondary | ICD-10-CM | POA: Diagnosis not present

## 2022-07-29 DIAGNOSIS — N183 Chronic kidney disease, stage 3 unspecified: Secondary | ICD-10-CM

## 2022-07-29 DIAGNOSIS — G903 Multi-system degeneration of the autonomic nervous system: Secondary | ICD-10-CM

## 2022-07-29 DIAGNOSIS — E1122 Type 2 diabetes mellitus with diabetic chronic kidney disease: Secondary | ICD-10-CM

## 2022-07-29 DIAGNOSIS — Z789 Other specified health status: Secondary | ICD-10-CM

## 2022-07-29 DIAGNOSIS — I447 Left bundle-branch block, unspecified: Secondary | ICD-10-CM

## 2022-07-29 DIAGNOSIS — I25118 Atherosclerotic heart disease of native coronary artery with other forms of angina pectoris: Secondary | ICD-10-CM

## 2022-07-29 NOTE — Patient Instructions (Signed)
Medication Instructions:  Your physician recommends that you continue on your current medications as directed. Please refer to the Current Medication list given to you today.  *If you need a refill on your cardiac medications before your next appointment, please call your pharmacy*   Lab Work: None If you have labs (blood work) drawn today and your tests are completely normal, you will receive your results only by: MyChart Message (if you have MyChart) OR A paper copy in the mail If you have any lab test that is abnormal or we need to change your treatment, we will call you to review the results.   Testing/Procedures: None   Follow-Up: At [facility], you and your health needs are our priority.  As part of our continuing mission to provide you with exceptional heart care, we have created designated Provider Care Teams.  These Care Teams include your primary Cardiologist (physician) and Advanced Practice Providers (APPs -  Physician Assistants and Nurse Practitioners) who all work together to provide you with the care you need, when you need it.  We recommend signing up for the patient portal called "MyChart".  Sign up information is provided on this After Visit Summary.  MyChart is used to connect with patients for Virtual Visits (Telemedicine).  Patients are able to view lab/test results, encounter notes, upcoming appointments, etc.  Non-urgent messages can be sent to your provider as well.   To learn more about what you can do with MyChart, go to [email or web address].    Your next appointment:   6 month(s)  Provider:   Brian Munley, MD    Other Instructions None  

## 2022-07-29 NOTE — Progress Notes (Signed)
Cardiology Office Note:    Date:  07/29/2022   ID:  Wendy Leon, DOB 01-30-1933, MRN [id number]  PCP:  Raina Mina., MD  Cardiologist:  Shirlee More, MD    Referring MD: Raina Mina., MD    ASSESSMENT:    1. Coronary artery disease of native artery of native heart with stable angina pectoris   2. Hypertensive heart disease with heart failure   3. Mixed hyperlipidemia   4. Statin intolerance   5. Neurogenic orthostatic hypotension   6. CKD stage 3 due to type 2 diabetes mellitus   7. Left bundle branch block    PLAN:    In order of problems listed above:  Overall Wendy Leon is doing well having no anginal discomfort on current medical treatment Stable blood pressure well-controlled no evidence of decompensated heart failure she will continue her current antihypertensive medications and at this time her current furosemide.   Well-controlled continue PCSK9 inhibitor No recurrence of orthostatic hypotension Stable CKD followed by nephrology [facility] Stable EKG pattern without progressive conduction system disease In general and she required a procedure for pain relief or to remain independent I think she is optimized in her case with her all of her medical problems I think she is a patient should stay overnight in observation if she has general anesthesia   Next appointment: 6 months   Medication Adjustments/Labs and Tests Ordered: Current medicines are reviewed at length with the patient today.  Concerns regarding medicines are outlined above.  Orders Placed This Encounter  Procedures   EKG 12-Lead   No orders of the defined types were placed in this encounter.      History of Present Illness:    Wendy Leon is a 89 y.o. female with a hx of CAD hypertensive heart disease with heart failure hyperlipidemia statin intolerance stage III CKD with diabetes mellitus and orthostatic hypotension last seen 09/09/2021. Cardiac CTA showed 60% proximal LAD stenosis  although FFR is diminished and she was medically treated her choice in the context of multiple severe comorbidities including dementia.  Compliance with diet, lifestyle and medications: Yes   Her son Letitia Libra aunts present participates in evaluation decision making Apparently there is a pain back surgeon [place] who anticipates doing a minor procedure they forgot to bring documentation with them Overall she has done well she has had no anginal discomfort syncope orthostatic lightheadedness edema shortness of breath or palpitation She can continues to use nocturnal nitroglycerin paste which has been remarkably effective Past Medical History:  Diagnosis Date   Abnormal gait 06/07/2009   Last Assessment & Plan:  She saw specialist who has given her orders for PT and she is wanting to have it setup for her at [facility] where she already is receiving care from at home   Acid reflux 02/20/2014   Aftercare following surgery 11/27/2015   Anemia of unknown etiology 10/18/2016   Anxiety 02/20/2014   Atrial fibrillation    CAD (coronary artery disease)    Cerebral ischemia 03/11/2016   Overview:  Microvascular ischemia seen on MRI 01/2015 with partial empty sella turcica neurology appt 03/12/2016 pending with [facility], also has [facility] with PT coming to the house for her gait abnormality and lumbar DDD, as well as local ortho (durranni)    Chest pain 11/03/2018   CHF (congestive heart failure) 06/24/2015   Last Assessment & Plan:  Relevant Hx: Course: Daily Update: Today's Plan:she has been doing well overall from  her heart and no SOB  Electronically signed by: Mayer Camel, NP 06/26/15 1522   Chronic diastolic heart failure [id number]   Last Assessment & Plan:  Relevant Hx: Course: Daily Update: Today's Plan:she has been doing well overall from her heart and no SOB  Electronically signed by: Mayer Camel, NP 06/26/15 1522   Chronic UTI 06/27/2015   Last Assessment & Plan:  She  requested a UA be checked for her as she was having more frequency than before and it has been awhile since she was on oral abx for this, she is on low dose maintenance med to prevent and has done well with that.   CKD stage 3 due to type 2 diabetes mellitus 02/24/2018   Colon polyps    COPD (chronic obstructive pulmonary disease) 03/09/2012   Last Assessment & Plan:  She feels she is stable from this discussed with her the allergies she has and she is going to discuss with Dr. Alcide Clever having allergy injections as well for this    Diabetes 02/20/2014   Dizziness 02/07/2016   Overview:  Chronic and has been having falls, MRI showing chronic microvascular ischemia, as well as partial sella turcica pending neuro appt for her with [facility]   Dyslipidemia 07/29/2015   Edema 06/24/2015   Last Assessment & Plan:  Relevant Hx: Course: Daily Update: Today's Plan:this is stable for her overall and will follow for her  Electronically signed by: Mayer Camel, NP 06/27/15 0813   Elevated cholesterol    Essential hypertension 03/09/2012   Last Assessment & Plan:  On repeat this was stable for her and she does not check it at home much but when has been checked has been fine for her   Fistula 02/20/2014   Gastroesophageal reflux disease without esophagitis 02/20/2014   Last Assessment & Plan:  Relevant Hx: Course: Daily Update: Today's Plan:not on meds for this as she has had issues with her arms cramping and she is going to just drink buttermilk  Electronically signed by: Mayer Camel, NP 06/26/15 1530   Generalized abdominal pain 10/24/2016   Hallux valgus of left foot 12/04/2015   Hallux valgus of right foot 12/04/2015   High risk medication use 06/24/2015   History of recurrent UTIs 06/15/2019   Hx of valvular heart disease 01/29/2016   Hypertension 02/20/2014   Hypertensive heart disease with heart failure 03/09/2012   Last Assessment & Plan:  On repeat this was stable for her and she does  not check it at home much but when has been checked has been fine for her   Hypothyroidism (acquired) 03/09/2012   Last Assessment & Plan:  She was due for this to be updated and have advised her to get this drawn while here today   IBS (irritable bowel syndrome) 06/24/2015   Last Assessment & Plan:  Relevant Hx: Course: Daily Update: Today's Plan:this is stable for her  Electronically signed by: Mayer Camel, NP 06/26/15 1531   IBS (irritable bowel syndrome)    Idiopathic progressive polyneuropathy 06/07/2009   Left bundle branch block 06/24/2015   Leukocytosis 06/24/2015   Last Assessment & Plan:  Relevant Hx: Course: Daily Update: Today's Plan:this has been stable for her with FU through hematology who felt she was having benign swings in this and she chose to not have bone marrow biopsy done   Electronically signed by: Mayer Camel, NP 06/27/15 [phone]   Liposarcoma, well differentiated type 03/09/2012   Overview:  Initial excision 03/18/2000 (Dr. Ralene Cork [facility]), 8 x 6 x 3 cm. Local recurrence, re-excision 03/23/2007 Candiss Norse), 5 x 3 x 2 cm. Surveillance plan: baseline MRI April 2014  Last Assessment & Plan:  Relevant Hx: Course: Daily Update: Today's Plan:she is stable from this  Electronically signed by: Mayer Camel, NP 06/26/15 1533   Lumbar degenerative disc disease 11/06/2015   Last Assessment & Plan:  At this point she needs updated MRI of her lumbar spine and we discussed with her gait abnormality she is likely going to have to see specialist at [facility] for this, she has seen local neuro for this several years ago in terms of her neuropathy and her falls and he really did not address it . She was advised several years ago to have back surgery locally which we did not feel was ideal with her sugars at the time being so out of control and now she is really more in control than then and it may be doable for her if felt  Needed though she would  have to have cardiology clear her as well.   Mild CAD 07/29/2015   Overview:  Overview:  Cath march 2016 with 60% LAD with normal FFR   Overview:  Cath march 2016 with 60% LAD with normal FFR    Mixed hyperlipidemia 06/24/2015   Last Assessment & Plan:  Relevant Hx: Course: Daily Update: Today's Plan:update her lipids for her today and she is trying to watch her diet   Electronically signed by: Mayer Camel, NP 06/27/15 0813   Obstructive sleep apnea syndrome 06/24/2015   Onychomycosis due to dermatophyte 11/27/2015   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   Last Assessment & Plan:  Relevant Hx: Course: Daily Update: Today's Plan:she is doing well overall  Electronically signed by: Mayer Camel, NP 06/26/15 1531   Pancreatitis    Pre-ulcerative calluses 12/04/2015   Pre-ulcerative corn or callous 11/27/2015   Primary osteoarthritis involving multiple joints 07/06/2015   Last Assessment & Plan:  Relevant Hx: Course: Daily Update: Today's Plan:she has DDD of her back with some spinal  Stenosis that she has seen ortho locally for and he wanted to do surgery which she has avoided and I agree with, she is going to use the flexeril for this right now as she is having more muscle spasm than what she has had in the past from her attempt to walk down some very steep steps that she was not used to . She should use her walker for support through the weekend and avoid overhead strain and lifting and stay in touch if not better.  Electronically signed by: Mayer Camel, NP 07/06/15 [phone]   Seasonal allergic rhinitis 01/12/2019   Sleep apnea    Stress incontinence of urine 04/17/2016   Syncope and collapse 10/13/2016   Thyroid disease    TIA (transient ischemic attack) 10/13/2016   Type 2 diabetes mellitus without complication, without long-term current use of insulin 03/09/2012   Last Assessment & Plan:  She brings in readings of her sugars which show she has been doing much better overall with  this at home and quite pleased with her   UTI (urinary tract infection)    Vascular calcification 01/29/2016   Visual changes 01/29/2016   Overview:  Following with Dr. Julio Alm as well as had recent carotid doppler showing minimal plaque present and vertebral arteries normal , nothing signficant   Vitamin B12 deficiency 12/04/2015    Past Surgical History:  Procedure Laterality Date   ABDOMINAL HYSTERECTOMY     CHOLECYSTECTOMY     EYE SURGERY     PANCREAS SURGERY     skin cancer lesion      Current Medications: Current Meds  Medication Sig   albuterol (PROVENTIL) (2.5 MG/3ML) 0.083% nebulizer solution Take 2.5 mg by nebulization every 4 (four) hours as needed for wheezing or shortness of breath.   albuterol (VENTOLIN HFA) 108 (90 Base) MCG/ACT inhaler Inhale 2 puffs into the lungs every 4 (four) hours as needed for wheezing or shortness of breath.   Alirocumab (PRALUENT) 150 MG/ML SOAJ Inject 150 mg into the skin every 14 (fourteen) days.   ALPRAZolam (XANAX) 0.5 MG tablet Take 0.5 mg by mouth 3 (three) times daily as needed for anxiety.    amLODipine (NORVASC) 5 MG tablet Take 2.5 mg by mouth daily.    ascorbic acid (VITAMIN C) 1000 MG tablet Take 1,000 mg by mouth daily.   aspirin EC 81 MG tablet Take 81 mg by mouth daily.   benazepril (LOTENSIN) 20 MG tablet Take 20 mg by mouth daily.   Blood Glucose Monitoring Suppl (ACCU-CHEK AVIVA PLUS) w/Device KIT See admin instructions.   celecoxib (CELEBREX) 200 MG capsule Take 200 mg by mouth daily.   Cholecalciferol 25 MCG (1000 UT) tablet Take 1,000 Units by mouth daily.   cyanocobalamin (,VITAMIN B-12,) 1000 MCG/ML injection Inject into the muscle every 30 (thirty) days.   D-MANNOSE PO Take 1 tablet by mouth daily.   donepezil (ARICEPT) 5 MG tablet Take 5 mg by mouth at bedtime.   fluticasone (FLONASE) 50 MCG/ACT nasal spray Place 1 spray into the nose 2 (two) times daily.   furosemide (LASIX) 40 MG tablet Take 1 tablet (40 mg total) by  mouth 2 (two) times daily.   gabapentin (NEURONTIN) 300 MG capsule Take 300 mg by mouth daily.   glimepiride (AMARYL) 1 MG tablet Take 0.5 tablets by mouth in the morning and at bedtime.    insulin glargine (LANTUS) 100 UNIT/ML Solostar Pen Inject 10 units at HS may increase to max 40 units per day based upon blood sugars   isosorbide mononitrate (IMDUR) 30 MG 24 hr tablet Take 1 tablet (30 mg total) by mouth daily.   levothyroxine (SYNTHROID, LEVOTHROID) 75 MCG tablet Take 75 mcg by mouth daily.   loratadine (CLARITIN) 10 MG tablet Take 10 mg by mouth daily as needed for allergies.   metFORMIN (GLUCOPHAGE-XR) 500 MG 24 hr tablet Take 500 mg by mouth 2 (two) times daily.   metoprolol tartrate (LOPRESSOR) 50 MG tablet Take 50 mg by mouth 2 (two) times daily.   montelukast (SINGULAIR) 10 MG tablet Take 10 mg by mouth at bedtime.   MYRBETRIQ 50 MG TB24 tablet Take 50 mg by mouth daily.   NITRO-BID 2 % ointment Apply 1 inch topically at bedtime.   nitroGLYCERIN (NITROSTAT) 0.4 MG SL tablet DISSOLVE 1 TABLET UNDER THE  TONGUE EVERY 5 MINUTES AS NEEDED FOR CHEST PAIN. MAX OF 3 TABLETS IN 15 MINUTES. CALL 911 IF PAIN  PERSISTS.   PREMARIN vaginal cream 3 (three) times a week.   SYMBICORT 160-4.5 MCG/ACT inhaler Inhale 2 puffs into the lungs 2 (two) times daily.   traMADol (ULTRAM) 50 MG tablet Take 50 mg by mouth every 6 (six) hours as needed for moderate pain.     Allergies:   Doxycycline, Nitrofurantoin, Statins, Sulfamethoxazole, and Trimethoprim   Social History   Socioeconomic History   Marital status: Widowed  Spouse name: Not on file   Number of children: Not on file   Years of education: Not on file   Highest education level: Not on file  Occupational History   Not on file  Tobacco Use   Smoking status: Never    Passive exposure: Past   Smokeless tobacco: Current    Types: Snuff  Vaping Use   Vaping Use: Never used  Substance and Sexual Activity   Alcohol use: No   Drug use:  No   Sexual activity: Not on file  Other Topics Concern   Not on file  Social History Narrative   Not on file   Social Determinants of Health   Financial Resource Strain: Not on file  Food Insecurity: Not on file  Transportation Needs: Not on file  Physical Activity: Not on file  Stress: Not on file  Social Connections: Not on file     Family History: The patient's family history includes Cancer in her sister; Diabetes in her mother; Hypertension in her father and mother. ROS:   Please see the history of present illness.    All other systems reviewed and are negative.  EKGs/Labs/Other Studies Reviewed:    The following studies were reviewed today:  Cardiac Studies & Procedures          CT SCANS  CT CORONARY MORPH W/CTA COR W/SCORE 11/22/2018  Addendum 11/22/2018  4:45 PM ADDENDUM REPORT: 11/22/2018 16:43  CLINICAL DATA:  Chest pain  EXAM: Cardiac CTA  MEDICATIONS: Sub lingual nitro. 4mg  x 2  TECHNIQUE: The patient was scanned on a Siemens [id number] slice scanner. Gantry rotation speed was 250 msecs. Collimation was 0.6 mm. A 100 kV prospective scan was triggered in the ascending thoracic aorta at 35-75% of the R-R interval. Average HR during the scan was 60 bpm. The 3D data set was interpreted on a dedicated work station using MPR, MIP and VRT modes. A total of 80cc of contrast was used.  FINDINGS: Non-cardiac: See separate report from [facility].  Pulmonary veins drain normally to the left atrium.  Calcium Score: 738 Agatston units.  Coronary Arteries: Right dominant with no anomalies  LM: Calcified plaque distal left main, no significant stenosis.  LAD system: Calcified plaque proximal and mid LAD, suspect no more than mild (<50%) stenosis.  Circumflex system: Calcified plaque in the proximal LCx, mild (<50%) stenosis.  RCA system: Mixed plaque in the proximal RCA with mild (<50%) stenosis. Calcified plaque mid RCA, suspect no more than  50% stenosis but difficult given blooming artifact.  IMPRESSION: 1. Coronary artery calcium score 738 Agatston units. This places the patient in the 82nd percentile for age and gender.  2.  Suspect nonobstructive CAD but will send for FFR to confirm.  Dalton Mclean   Electronically Signed By: Loralie Champagne M.D. On: 11/22/2018 16:43  Narrative EXAM: OVER-READ INTERPRETATION  CT CHEST  The following report is an over-read performed by radiologist Dr. Misty Stanley of [facility], [place] on 11/22/2018. This over-read does not include interpretation of cardiac or coronary anatomy or pathology. The coronary CTA interpretation by the cardiologist is attached.  COMPARISON:  05/18/2017  FINDINGS: Vascular: There is abdominal aortic atherosclerosis without aneurysm.  Mediastinum/Nodes: No lymphadenopathy within the visualized mediastinum. No lymphadenopathy within the visualized hilar regions.  Lungs/Pleura: 6 mm peripheral right lower lobe nodule (19/12) is stable in the interval, compatible with benign etiology. Small subpleural nodules in the right upper lobe (images 2, 3/12) are unchanged. Nodularity posterior to the  major fissure, left lower lobe on 01/12 is stable. No evidence for pleural effusion.  Upper Abdomen: Unremarkable.  Musculoskeletal: No worrisome lytic or sclerotic osseous abnormality.  IMPRESSION: 1. Small bilateral pulmonary nodules stable since 05/18/2017 consistent with benign etiology. 2.  Aortic Atherosclerois (ICD10-170.0)  Electronically Signed: By: Misty Stanley M.D. On: 11/22/2018 16:26          EKG:  EKG ordered today and personally reviewed.  The ekg ordered today demonstrates sinus rhythm left bundle branch block    Physical Exam:    VS:  BP 136/64 (BP Location: Right Arm, Patient Position: Sitting)   Pulse 61   Ht 5\' 4"  (1.626 m)   Wt 160 lb 6.4 oz (72.8 kg)   SpO2 94%   BMI 27.53 kg/m     Wt Readings from Last 3  Encounters:  07/29/22 160 lb 6.4 oz (72.8 kg)  09/09/21 165 lb 12.8 oz (75.2 kg)  03/11/21 166 lb (75.3 kg)     GEN: Paradoxical second heart sound well nourished, well developed in no acute distress HEENT: Normal NECK: No JVD; No carotid bruits LYMPHATICS: No lymphadenopathy CARDIAC: RRR, no murmurs, rubs, gallops RESPIRATORY:  Clear to auscultation without rales, wheezing or rhonchi  ABDOMEN: Soft, non-tender, non-distended MUSCULOSKELETAL:  No edema; No deformity  SKIN: Warm and dry NEUROLOGIC:  Alert and oriented x 3 PSYCHIATRIC:  Normal affect    Signed, Shirlee More, MD  07/29/2022 2:29 PM    [place]

## 2022-08-03 ENCOUNTER — Other Ambulatory Visit: Payer: Self-pay | Admitting: Cardiology

## 2022-08-23 ENCOUNTER — Observation Stay (HOSPITAL_BASED_OUTPATIENT_CLINIC_OR_DEPARTMENT_OTHER)
Admission: EM | Admit: 2022-08-23 | Discharge: 2022-08-25 | Disposition: A | Discharge status: Home / Self Care | Payer: Medicare Other | Attending: Internal Medicine | Admitting: Internal Medicine

## 2022-08-23 ENCOUNTER — Other Ambulatory Visit: Payer: Self-pay

## 2022-08-23 ENCOUNTER — Emergency Department (HOSPITAL_BASED_OUTPATIENT_CLINIC_OR_DEPARTMENT_OTHER): Payer: Medicare Other

## 2022-08-23 ENCOUNTER — Encounter (HOSPITAL_BASED_OUTPATIENT_CLINIC_OR_DEPARTMENT_OTHER): Payer: Self-pay | Admitting: Emergency Medicine

## 2022-08-23 DIAGNOSIS — E039 Hypothyroidism, unspecified: Secondary | ICD-10-CM | POA: Insufficient documentation

## 2022-08-23 DIAGNOSIS — I2510 Atherosclerotic heart disease of native coronary artery without angina pectoris: Secondary | ICD-10-CM | POA: Insufficient documentation

## 2022-08-23 DIAGNOSIS — I159 Secondary hypertension, unspecified: Secondary | ICD-10-CM | POA: Insufficient documentation

## 2022-08-23 DIAGNOSIS — Z79899 Other long term (current) drug therapy: Secondary | ICD-10-CM | POA: Insufficient documentation

## 2022-08-23 DIAGNOSIS — H532 Diplopia: Secondary | ICD-10-CM

## 2022-08-23 DIAGNOSIS — I5032 Chronic diastolic (congestive) heart failure: Secondary | ICD-10-CM | POA: Diagnosis not present

## 2022-08-23 DIAGNOSIS — R410 Disorientation, unspecified: Secondary | ICD-10-CM

## 2022-08-23 DIAGNOSIS — N1832 Chronic kidney disease, stage 3b: Secondary | ICD-10-CM | POA: Insufficient documentation

## 2022-08-23 DIAGNOSIS — J449 Chronic obstructive pulmonary disease, unspecified: Secondary | ICD-10-CM | POA: Diagnosis present

## 2022-08-23 DIAGNOSIS — R4781 Slurred speech: Secondary | ICD-10-CM

## 2022-08-23 DIAGNOSIS — I130 Hypertensive heart and chronic kidney disease with heart failure and stage 1 through stage 4 chronic kidney disease, or unspecified chronic kidney disease: Secondary | ICD-10-CM | POA: Diagnosis not present

## 2022-08-23 DIAGNOSIS — G4733 Obstructive sleep apnea (adult) (pediatric): Secondary | ICD-10-CM | POA: Diagnosis present

## 2022-08-23 DIAGNOSIS — N183 Chronic kidney disease, stage 3 unspecified: Secondary | ICD-10-CM

## 2022-08-23 DIAGNOSIS — Z7984 Long term (current) use of oral hypoglycemic drugs: Secondary | ICD-10-CM | POA: Diagnosis not present

## 2022-08-23 DIAGNOSIS — E1122 Type 2 diabetes mellitus with diabetic chronic kidney disease: Secondary | ICD-10-CM | POA: Insufficient documentation

## 2022-08-23 DIAGNOSIS — J45909 Unspecified asthma, uncomplicated: Secondary | ICD-10-CM | POA: Insufficient documentation

## 2022-08-23 DIAGNOSIS — G459 Transient cerebral ischemic attack, unspecified: Secondary | ICD-10-CM | POA: Diagnosis not present

## 2022-08-23 DIAGNOSIS — Z6827 Body mass index (BMI) 27.0-27.9, adult: Secondary | ICD-10-CM | POA: Diagnosis not present

## 2022-08-23 DIAGNOSIS — Z794 Long term (current) use of insulin: Secondary | ICD-10-CM | POA: Insufficient documentation

## 2022-08-23 DIAGNOSIS — E1169 Type 2 diabetes mellitus with other specified complication: Secondary | ICD-10-CM

## 2022-08-23 DIAGNOSIS — I10 Essential (primary) hypertension: Secondary | ICD-10-CM | POA: Diagnosis present

## 2022-08-23 DIAGNOSIS — N1831 Chronic kidney disease, stage 3a: Secondary | ICD-10-CM | POA: Diagnosis present

## 2022-08-23 DIAGNOSIS — E871 Hypo-osmolality and hyponatremia: Secondary | ICD-10-CM | POA: Diagnosis not present

## 2022-08-23 DIAGNOSIS — E119 Type 2 diabetes mellitus without complications: Secondary | ICD-10-CM

## 2022-08-23 HISTORY — DX: Hypo-osmolality and hyponatremia: E87.1

## 2022-08-23 LAB — CBC
HCT: 36.9 % (ref 36.0–46.0)
Hemoglobin: 12 g/dL (ref 12.0–15.0)
MCH: 29.8 pg (ref 26.0–34.0)
MCHC: 32.5 g/dL (ref 30.0–36.0)
MCV: 91.6 fL (ref 80.0–100.0)
Platelets: 391 10*3/uL (ref 150–400)
RBC: 4.03 MIL/uL (ref 3.87–5.11)
RDW: 12.9 % (ref 11.5–15.5)
WBC: 14.3 10*3/uL — ABNORMAL HIGH (ref 4.0–10.5)
nRBC: 0 % (ref 0.0–0.2)

## 2022-08-23 LAB — COMPREHENSIVE METABOLIC PANEL
ALT: 19 U/L (ref 0–44)
AST: 25 U/L (ref 15–41)
Albumin: 4 g/dL (ref 3.5–5.0)
Alkaline Phosphatase: 49 U/L (ref 38–126)
Anion gap: 10 (ref 5–15)
BUN: 27 mg/dL — ABNORMAL HIGH (ref 8–23)
CO2: 30 mmol/L (ref 22–32)
Calcium: 9.2 mg/dL (ref 8.9–10.3)
Chloride: 90 mmol/L — ABNORMAL LOW (ref 98–111)
Creatinine, Ser: 1.09 mg/dL — ABNORMAL HIGH (ref 0.44–1.00)
GFR, Estimated: 49 mL/min — ABNORMAL LOW (ref >60)
Glucose, Bld: 139 mg/dL — ABNORMAL HIGH (ref 70–99)
Potassium: 3.6 mmol/L (ref 3.5–5.1)
Sodium: 130 mmol/L — ABNORMAL LOW (ref 135–145)
Total Bilirubin: 0.6 mg/dL (ref 0.3–1.2)
Total Protein: 7.9 g/dL (ref 6.5–8.1)

## 2022-08-23 LAB — APTT: aPTT: 37 seconds — ABNORMAL HIGH (ref 24–36)

## 2022-08-23 LAB — URINALYSIS, ROUTINE W REFLEX MICROSCOPIC
Bilirubin Urine: NEGATIVE
Glucose, UA: NEGATIVE mg/dL
Hgb urine dipstick: NEGATIVE
Ketones, ur: NEGATIVE mg/dL
Leukocytes,Ua: NEGATIVE
Nitrite: NEGATIVE
Protein, ur: NEGATIVE mg/dL
Specific Gravity, Urine: 1.01 (ref 1.005–1.030)
pH: 6.5 (ref 5.0–8.0)

## 2022-08-23 LAB — RAPID URINE DRUG SCREEN, HOSP PERFORMED
Amphetamines: NOT DETECTED
Barbiturates: NOT DETECTED
Benzodiazepines: NOT DETECTED
Cocaine: NOT DETECTED
Opiates: NOT DETECTED
Tetrahydrocannabinol: NOT DETECTED

## 2022-08-23 LAB — DIFFERENTIAL
Abs Immature Granulocytes: 0.04 10*3/uL (ref 0.00–0.07)
Basophils Absolute: 0.1 10*3/uL (ref 0.0–0.1)
Basophils Relative: 1 %
Eosinophils Absolute: 0.3 10*3/uL (ref 0.0–0.5)
Eosinophils Relative: 2 %
Immature Granulocytes: 0 %
Lymphocytes Relative: 37 %
Lymphs Abs: 5.3 10*3/uL — ABNORMAL HIGH (ref 0.7–4.0)
Monocytes Absolute: 1.9 10*3/uL — ABNORMAL HIGH (ref 0.1–1.0)
Monocytes Relative: 13 %
Neutro Abs: 6.7 10*3/uL (ref 1.7–7.7)
Neutrophils Relative %: 47 %

## 2022-08-23 LAB — PROTIME-INR
INR: 0.9 (ref 0.8–1.2)
Prothrombin Time: 12.4 seconds (ref 11.4–15.2)

## 2022-08-23 LAB — ETHANOL: Alcohol, Ethyl (B): <10 mg/dL (ref <10)

## 2022-08-23 MED ORDER — ACETAMINOPHEN 160 MG/5ML PO SOLN: 650.0000 mg | ORAL | Status: DC | PRN

## 2022-08-23 MED ORDER — HEPARIN SODIUM (PORCINE) 5000 UNIT/ML IJ SOLN
5000.0000 [IU] | Freq: Three times a day (TID) | INTRAMUSCULAR | Status: DC
  Administered 2022-08-24 – 2022-08-25 (×4): 5000 [IU] via SUBCUTANEOUS
  Filled 2022-08-23 (×4): qty 1

## 2022-08-23 MED ORDER — ACETAMINOPHEN 325 MG PO TABS: 650.0000 mg | ORAL_TABLET | ORAL | Status: DC | PRN

## 2022-08-23 MED ORDER — STROKE: EARLY STAGES OF RECOVERY BOOK
Freq: Once | Status: AC
  Filled 2022-08-23: qty 1

## 2022-08-23 MED ORDER — ASPIRIN 81 MG PO TBEC
81.0000 mg | DELAYED_RELEASE_TABLET | Freq: Every day | ORAL | Status: DC
  Administered 2022-08-24 – 2022-08-25 (×2): 81 mg via ORAL
  Filled 2022-08-23 (×2): qty 1

## 2022-08-23 MED ORDER — ONDANSETRON HCL 4 MG/2ML IJ SOLN: 4.0000 mg | Freq: Four times a day (QID) | INTRAMUSCULAR | Status: DC | PRN

## 2022-08-23 MED ORDER — LEVOTHYROXINE SODIUM 75 MCG PO TABS
75.0000 ug | ORAL_TABLET | Freq: Every day | ORAL | Status: DC
  Administered 2022-08-24 – 2022-08-25 (×2): 75 ug via ORAL
  Filled 2022-08-23 (×2): qty 1

## 2022-08-23 MED ORDER — ALPRAZOLAM 0.5 MG PO TABS
0.5000 mg | ORAL_TABLET | Freq: Three times a day (TID) | ORAL | Status: DC | PRN
  Administered 2022-08-24: 0.5 mg via ORAL
  Filled 2022-08-23: qty 1

## 2022-08-23 MED ORDER — ACETAMINOPHEN 650 MG RE SUPP: 650.0000 mg | RECTAL | Status: DC | PRN

## 2022-08-23 MED ORDER — CLOPIDOGREL BISULFATE 300 MG PO TABS
300.0000 mg | ORAL_TABLET | Freq: Once | ORAL | Status: AC
  Administered 2022-08-23: 300 mg via ORAL
  Filled 2022-08-23: qty 1

## 2022-08-23 NOTE — ED Triage Notes (Signed)
Pt's family reports several changes x 3 days, including double vision or "vision not right", periods of aphasia; HA, dizziness; denies HA now, A&O x 4

## 2022-08-23 NOTE — ED Notes (Signed)
Assisted patient to bedside commode

## 2022-08-23 NOTE — Assessment & Plan Note (Signed)
Continue with cpap at night. Pt states she uses supplemental O2 at night.

## 2022-08-23 NOTE — Assessment & Plan Note (Signed)
Check tsh. Likely due to lasix use.

## 2022-08-23 NOTE — ED Provider Notes (Addendum)
EMERGENCY DEPARTMENT AT [facility] Provider Note   CSN: [id number] Arrival date & time: 08/23/22  1210     History  Chief Complaint  Patient presents with   Visual Field Change   Aphasia         Wendy Leon is a 89 y.o. female.  HPI   89 year old female with medical history significant for cerebral ischemia, CHF, HTN, left bundle branch block/CAD, COPD, OSA, GERD, DM2 who presents to the emergency department with strokelike symptoms.  The patient and family state that she has had double vision and intermittent blurry vision for the past 3 days.  She has also been intermittently confused and had some episodes of aphasia and slurred speech.  No dysphagia, no facial droop, no numbness or weakness in any extremity.  She had a mild headache with associated lightheadedness over the last few days as well and has had elevated blood pressures at home.  No recent falls or trauma.  Symptoms have persisted over the past 3 days.  Home Medications Prior to Admission medications   Medication Sig Start Date End Date Taking? Authorizing Provider  albuterol (PROVENTIL) (2.5 MG/3ML) 0.083% nebulizer solution Take 2.5 mg by nebulization every 4 (four) hours as needed for wheezing or shortness of breath. 02/03/20   [provider]  albuterol (VENTOLIN HFA) 108 (90 Base) MCG/ACT inhaler Inhale 2 puffs into the lungs every 4 (four) hours as needed for wheezing or shortness of breath. 12/30/19   [provider]  Alirocumab (PRALUENT) 150 MG/ML SOAJ Inject 1 mL into the skin every 14 (fourteen) days. 08/04/22   Baldo Daub, MD  ALPRAZolam Prudy Feeler) 0.5 MG tablet Take 0.5 mg by mouth 3 (three) times daily as needed for anxiety.  10/13/16   [provider]  amLODipine (NORVASC) 5 MG tablet Take 2.5 mg by mouth daily.     [provider]  ascorbic acid (VITAMIN C) 1000 MG tablet Take 1,000 mg by mouth daily.    [provider]  aspirin EC 81 MG  tablet Take 81 mg by mouth daily.    [provider]  benazepril (LOTENSIN) 20 MG tablet Take 20 mg by mouth daily. 05/26/17   [provider]  Blood Glucose Monitoring Suppl (ACCU-CHEK AVIVA PLUS) w/Device KIT See admin instructions. 10/07/16   [provider]  celecoxib (CELEBREX) 200 MG capsule Take 200 mg by mouth daily. 08/16/21   [provider]  Cholecalciferol 25 MCG (1000 UT) tablet Take 1,000 Units by mouth daily.    [provider]  cyanocobalamin (,VITAMIN B-12,) 1000 MCG/ML injection Inject into the muscle every 30 (thirty) days. 03/08/19   [provider]  D-MANNOSE PO Take 1 tablet by mouth daily.    [provider]  donepezil (ARICEPT) 5 MG tablet Take 5 mg by mouth at bedtime. 11/14/16   [provider]  fluticasone (FLONASE) 50 MCG/ACT nasal spray Place 1 spray into the nose 2 (two) times daily. 01/29/12   [provider]  furosemide (LASIX) 40 MG tablet Take 1 tablet (40 mg total) by mouth 2 (two) times daily. 09/25/21   Baldo Daub, MD  gabapentin (NEURONTIN) 300 MG capsule Take 300 mg by mouth daily. 11/14/16   [provider]  glimepiride (AMARYL) 1 MG tablet Take 0.5 tablets by mouth in the morning and at bedtime.  11/13/16   [provider]  insulin glargine (LANTUS) 100 UNIT/ML Solostar Pen Inject 10 units at HS may  increase to max 40 units per day based upon blood sugars 05/21/18   [provider]  isosorbide mononitrate (IMDUR) 30 MG 24 hr tablet Take 1 tablet (30 mg total) by mouth daily. 09/14/20   Baldo Daub, MD  levothyroxine (SYNTHROID, LEVOTHROID) 75 MCG tablet Take 75 mcg by mouth daily. 11/14/16   [provider]  loratadine (CLARITIN) 10 MG tablet Take 10 mg by mouth daily as needed for allergies.    [provider]  metFORMIN (GLUCOPHAGE-XR) 500 MG 24 hr tablet Take 500 mg by mouth 2 (two) times daily. 11/14/16   [provider]   metoprolol tartrate (LOPRESSOR) 50 MG tablet Take 50 mg by mouth 2 (two) times daily.    [provider]  montelukast (SINGULAIR) 10 MG tablet Take 10 mg by mouth at bedtime. 11/17/16   [provider]  MYRBETRIQ 50 MG TB24 tablet Take 50 mg by mouth daily. 05/30/20   [provider]  NITRO-BID 2 % ointment Apply 1 inch topically at bedtime. 08/13/21   Baldo Daub, MD  nitroGLYCERIN (NITROSTAT) 0.4 MG SL tablet DISSOLVE 1 TABLET UNDER THE  TONGUE EVERY 5 MINUTES AS NEEDED FOR CHEST PAIN. MAX OF 3 TABLETS IN 15 MINUTES. CALL 911 IF PAIN  PERSISTS. 02/11/22   Baldo Daub, MD  PREMARIN vaginal cream 3 (three) times a week. 01/31/20   [provider]  SYMBICORT 160-4.5 MCG/ACT inhaler Inhale 2 puffs into the lungs 2 (two) times daily. 11/17/16   [provider]  traMADol (ULTRAM) 50 MG tablet Take 50 mg by mouth every 6 (six) hours as needed for moderate pain. 03/20/20   [provider]      Allergies    Doxycycline, Nitrofurantoin, Statins, Sulfamethoxazole, and Trimethoprim    Review of Systems   Review of Systems  All other systems reviewed and are negative.   Physical Exam Updated Vital Signs BP (!) 152/63   Pulse (!) 55   Temp (!) 97.5 F (36.4 C)   Resp (!) 21   Ht 5\' 4"  (1.626 m)   Wt 72.1 kg   SpO2 93%   BMI 27.29 kg/m  Physical Exam Vitals and nursing note reviewed.  Constitutional:      General: She is not in acute distress.    Appearance: She is well-developed.  HENT:     Head: Normocephalic and atraumatic.  Eyes:     Conjunctiva/sclera: Conjunctivae normal.  Neck:     Comments: No meningismus Cardiovascular:     Rate and Rhythm: Normal rate and regular rhythm.     Heart sounds: No murmur heard. Pulmonary:     Effort: Pulmonary effort is normal. No respiratory distress.     Breath sounds: Normal breath sounds.  Abdominal:     Palpations: Abdomen is soft.     Tenderness: There is no abdominal tenderness.   Musculoskeletal:        General: No swelling.     Cervical back: Neck supple.  Skin:    General: Skin is warm and dry.     Capillary Refill: Capillary refill takes less than 2 seconds.  Neurological:     Mental Status: She is alert.     Comments: MENTAL STATUS EXAM:    Orientation: Alert and oriented to person, place and time.  Memory: Cooperative, follows commands well.  Language: Speech is clear and language is normal.   CRANIAL NERVES:    CN 2 (Optic): Visual fields intact to confrontation.  CN  3,4,6 (EOM): Pupils equal and reactive to light. Full extraocular eye movement without nystagmus.  CN 5 (Trigeminal): Facial sensation is normal, no weakness of masticatory muscles.  CN 7 (Facial): No facial weakness or asymmetry.  CN 8 (Auditory): Auditory acuity grossly normal.  CN 9,10 (Glossophar): The uvula is midline, the palate elevates symmetrically.  CN 11 (spinal access): Normal sternocleidomastoid and trapezius strength.  CN 12 (Hypoglossal): The tongue is midline. No atrophy or fasciculations.Marland Kitchen   MOTOR:  Muscle Strength: 5/5RUE, 5/5LUE, 5/5RLE, 5/5LLE.   COORDINATION:   Intact finger-to-nose, no tremor.   SENSATION:   Intact to light touch all four extremities.    Psychiatric:        Mood and Affect: Mood normal.     ED Results / Procedures / Treatments   Labs (all labs ordered are listed, but only abnormal results are displayed) Labs Reviewed  APTT - Abnormal; Notable for the following components:      Result Value   aPTT 37 (*)    All other components within normal limits  CBC - Abnormal; Notable for the following components:   WBC 14.3 (*)    All other components within normal limits  DIFFERENTIAL - Abnormal; Notable for the following components:   Lymphs Abs 5.3 (*)    Monocytes Absolute 1.9 (*)    All other components within normal limits  COMPREHENSIVE METABOLIC PANEL - Abnormal; Notable for the following components:   Sodium 130 (*)    Chloride 90 (*)     Glucose, Bld 139 (*)    BUN 27 (*)    Creatinine, Ser 1.09 (*)    GFR, Estimated 49 (*)    All other components within normal limits  ETHANOL  PROTIME-INR  RAPID URINE DRUG SCREEN, HOSP PERFORMED  URINALYSIS, ROUTINE W REFLEX MICROSCOPIC    EKG EKG Interpretation  Date/Time:  Saturday August 23 2022 12:21:36 EDT Ventricular Rate:  55 PR Interval:  179 QRS Duration: 138 QT Interval:  482 QTC Calculation: 461 R Axis:   74 Text Interpretation: Sinus rhythm IVCD, consider atypical LBBB Confirmed by Ernie Avena ([id number]) on 08/23/2022 12:32:12 PM  Radiology CT HEAD WO CONTRAST  Result Date: 08/23/2022 CLINICAL DATA:  Neuro deficit EXAM: CT HEAD WITHOUT CONTRAST TECHNIQUE: Contiguous axial images were obtained from the base of the skull through the vertex without intravenous contrast. RADIATION DOSE REDUCTION: This exam was performed according to the departmental dose-optimization program which includes automated exposure control, adjustment of the mA and/or kV according to patient size and/or use of iterative reconstruction technique. COMPARISON:  None Available. FINDINGS: Brain: No evidence of acute infarction, hemorrhage, hydrocephalus, extra-axial collection or mass lesion/mass effect. Periventricular and deep white matter hypodensity. Vascular: No hyperdense vessel or unexpected calcification. Skull: Normal. Negative for fracture or focal lesion. Sinuses/Orbits: No acute finding. Other: None. IMPRESSION: No acute intracranial pathology. Small-vessel white matter disease. Electronically Signed   By: Jearld Lesch M.D.   On: 08/23/2022 13:25    Procedures Procedures    Medications Ordered in ED Medications - No data to display  ED Course/ Medical Decision Making/ A&P Clinical Course as of 08/23/22 1504  Sat Aug 23, 2022  1452 BP(!): 202/93 [JL]  1452 BP(!): 152/63 [JL]    Clinical Course User Index [JL] Ernie Avena, MD                             Medical Decision  Making Amount and/or Complexity of  Data Reviewed Labs: ordered. Radiology: ordered.  Risk Decision regarding hospitalization.     89 year old female with medical history significant for cerebral ischemia, CHF, HTN, left bundle branch block/CAD, COPD, OSA, GERD, DM2 who presents to the emergency department with strokelike symptoms.  The patient and family state that she has had double vision and intermittent blurry vision for the past 3 days.  She has also been intermittently confused and had some episodes of aphasia and slurred speech.  No dysphagia, no facial droop, no numbness or weakness in any extremity.  She had a mild headache with associated lightheadedness over the last few days as well and has had elevated blood pressures at home.  No recent falls or trauma.  Symptoms have persisted over the past 3 days.  On arrival, the patient was afebrile, bradycardic heart rate 55, hypertensive BP 202/93, saturating 98% on room air.  Physical exam significant for a generally reassuring neurologic exam.  Differential diagnosis includes hypertensive emergency/PR ES, CVA, intracranial hemorrhage, migraine headache.  Code stroke not activated due to patient being outside the window given her 3 days of symptoms.  Stroke workup initiated to include laboratory evaluation which revealed a CBC with a nonspecific leukocytosis to 14.3, hemoglobin 12, differential revealed a lymphocytosis, UDS negative, urinalysis negative, CMP with mild hyperglycemia to 139 without an anion gap acidosis.  Patient has no meningeal signs on exam.  CT Head WO: IMPRESSION:  No acute intracranial pathology. Small-vessel white matter disease.    Due to concern for double vision, intermittent aphasia and confusion, considered hypertensive emergency/press versus acute CVA.  Teleneurology consulted for further recommendations, recommendations pending at time of signout.  Signout given to Dr. Rhoderick Moody at [phone].  Final Clinical  Impression(s) / ED Diagnoses Final diagnoses:  Diplopia  Confusion  Slurred speech  Secondary hypertension    Rx / DC Orders ED Discharge Orders     None         Ernie Avena, MD 08/23/22 1503    Ernie Avena, MD 08/23/22 1504

## 2022-08-23 NOTE — Assessment & Plan Note (Signed)
Stable. On RA. ?

## 2022-08-23 NOTE — ED Notes (Signed)
Assisted Tele Neuro MD with assessment

## 2022-08-23 NOTE — Assessment & Plan Note (Addendum)
Observation med/tele bed. Check MRI brain/MRA head/neck. Check echo. Check lipid panel. Start plavix 75 mg daily and asa 81 mg daily. Pt already loaded with 300 mg plavix in ER. Allow for permissive hypertension for now. Keep BP <220/110

## 2022-08-23 NOTE — Subjective & Objective (Signed)
CC: double vision, slurred speech HPI: 89 year old female prior history of stroke, diastolic heart failure, COPD, OSA, type 2 diabetes, CKD stage III, hypertension, presents to the ER with 2 days of word finding, somewhat altered speech, 3 days of diplopia.  Patient lives with her daughter Sallye Ober.  She presented to the ER with her daughter Lupita Leash and her son-in-law Hessie Diener.  Daughter provides most the history.  She states the patient been complaining of word finding and difficulty trying to balance her credit card bills for last 2 days.  Patient states that she has had double vision for the last 3 days.  She also relates that she has had headaches for the last year.  She went to the ER for evaluation.  Given the fact that she was more than 72 hours since her start of her diplopia, TNKase was not offered.  Her CT head demonstrated no acute intracranial abnormality.  Sodium 130, potassium 3.6, BUN of 27, creatinine 1.0  White count 14.3, hemoglobin 12.0, platelets of 391  Patient seen by teleneurology who recommended admission to hospital complete TIA workup.  [facility] hospitalist contacted for admission.

## 2022-08-23 NOTE — ED Notes (Signed)
Patient transported to [facility] via [employer]

## 2022-08-23 NOTE — Assessment & Plan Note (Signed)
Stable

## 2022-08-23 NOTE — Assessment & Plan Note (Signed)
Stable. Avoid IV contrast if possible.

## 2022-08-23 NOTE — ED Notes (Signed)
Called [facility] for transport at 4:59.  

## 2022-08-23 NOTE — Consult Note (Signed)
[facility] TeleNeurology Consult Services  Stat Consult  Patient Name:   Wendy Leon, Wendy Leon Date of Birth:   August 13, 1932 Identification Number:   MRN - [id number] Date of Service:   08/23/2022 13:57:35  Diagnosis:       I63.89 - Cerebrovascular accident (CVA) due to other mechanism [facility])  Impression 89 yo woman presenting for evaluation of speech changes, weakness, and dizziness for several days. Recommend admission for further workup including brain MRI and routine CTA H/N. Continue home aspirin and place on plavix 75mg  daily with 300mg  load today.   Recommendations: Our recommendations are outlined below.  Diagnostic Studies : MRI head without contrastCTA head and neck with contrast  Laboratory Studies : Lipid panel I ordered Hemoglobin A1c  Antithrombotic Medication : Bolus with Clopidogrel 300 mg bolus x1 and initiate dual antiplatelet therapy with Aspirin 81 mg daily and Clopidogrel 75 mg daily  Nursing Recommendations : IV Fluids, avoid dextrose containing fluids, Maintain euglycemiaNeuro checks q4 hrs x 24 hrs and then per shiftHead of bed 30 degreesContinue with Telemetry  Consultations : Recommend Speech therapy if failed dysphagia screenPhysical therapy/Occupational therapyInpatient rehab if recommended by physical/occupational therapy  Disposition : Neurology will follow   ----------------------------------------------------------------------------------------------------    Metrics: [facility] Notification Time: 08/23/2022 13:54:08 Stamp Time: 08/23/2022 13:57:35 Callback Response Time: 08/23/2022 13:54:36  Primary Provider Notified of Diagnostic Impression and Management Plan on: 08/23/2022 16:17:28     ----------------------------------------------------------------------------------------------------  Chief Complaint: slurred speech, weakness, dizziness  History of Present Illness: Patient is a 89 year old Female. This is an 89 yo woman  who presents for evaluation of several days of feeling "weird". She says she has felt dizzy, both hot and cold, and had some difficulty expressing herself. She thinks her LLE has been heavy and she's had difficulty walking.  She has a remote history of stroke and is on aspirin. Daughter also notices some more confusion recently.  Head CT neg for acute pathology.   Past Medical History:      Hypertension      Diabetes Mellitus      Stroke  Medications:  No Anticoagulant use  Antiplatelet use: Yes aspirin Reviewed EMR for current medications  Allergies:  Reviewed  Social History: Smoking: No  Family History:  There is no family history of premature cerebrovascular disease pertinent to this consultation  ROS : 14 Points Review of Systems was performed and was negative except mentioned in HPI.  Past Surgical History: There Is No Surgical History Contributory To Today's Visit   Examination: BP(160/61), Pulse(58), 1A: Level of Consciousness - Alert; keenly responsive + 0 1B: Ask Month and Age - Both Questions Right + 0 1C: Blink Eyes & Squeeze Hands - Performs Both Tasks + 0 2: Test Horizontal Extraocular Movements - Normal + 0 3: Test Visual Fields - No Visual Loss + 0 4: Test Facial Palsy (Use Grimace if Obtunded) - Normal symmetry + 0 5A: Test Left Arm Motor Drift - No Drift for 10 Seconds + 0 5B: Test Right Arm Motor Drift - No Drift for 10 Seconds + 0 6A: Test Left Leg Motor Drift - No Drift for 5 Seconds + 0 6B: Test Right Leg Motor Drift - No Drift for 5 Seconds + 0 7: Test Limb Ataxia (FNF/Heel-Shin) - No Ataxia + 0 8: Test Sensation - Normal; No sensory loss + 0 9: Test Language/Aphasia - Normal; No aphasia + 0 10: Test Dysarthria - Mild-Moderate Dysarthria: Slurring but can be understood + 1 11: Test Extinction/Inattention -  No abnormality + 0  NIHSS Score: 1  Spoke with : Dr. Suezanne Jacquet    Patient / Family was informed the Neurology Consult would occur  via TeleHealth consult by way of interactive audio and video telecommunications and consented to receiving care in this manner.  Patient is being evaluated for possible acute neurologic impairment and high probability of imminent or life - threatening deterioration.I spent total of 25 minutes providing care to this patient, including time for face to face visit via telemedicine, review of medical records, imaging studies and discussion of findings with providers, the patient and / or family.   Dr Parthenia Ames   [facility] For Inpatient follow-up with [facility] physician please call [facility] [phone]. This is not an outpatient service. Post hospital discharge, please contact hospital directly.  Please do not communicate with [facility] physicians via secure chat. If you have any questions, Please contact [facility]. Please call or reconsult our service if there are any clinical or diagnostic changes.

## 2022-08-23 NOTE — H&P (Addendum)
History and Physical    DARNEISHA WINDHORST ZOX:[id number] DOB: [birth year] DOA: 08/23/2022  DOS: the patient was seen and examined on 08/23/2022  PCP: Gordan Payment., MD   Patient coming from: Home  I have personally briefly reviewed patient's old medical records in [facility]  CC: double vision, slurred speech HPI: 89 year old female prior history of stroke, diastolic heart failure, COPD, OSA, type 2 diabetes, CKD stage III, hypertension, presents to the ER with 2 days of word finding, somewhat altered speech, 3 days of diplopia.  Patient lives with her daughter Sallye Ober.  She presented to the ER with her daughter Lupita Leash and her son-in-law Hessie Diener.  Daughter provides most the history.  She states the patient been complaining of word finding and difficulty trying to balance her credit card bills for last 2 days.  Patient states that she has had double vision for the last 3 days.  She also relates that she has had headaches for the last year.  She went to the ER for evaluation.  Given the fact that she was more than 72 hours since her start of her diplopia, TNKase was not offered.  Her CT head demonstrated no acute intracranial abnormality.  Sodium 130, potassium 3.6, BUN of 27, creatinine 1.0  White count 14.3, hemoglobin 12.0, platelets of 391  Patient seen by teleneurology who recommended admission to hospital complete TIA workup.  [facility] contacted for admission.   ED Course: CT head negative. Sodium 130  Review of Systems:  Review of Systems  Constitutional: Negative.   HENT:  Negative for hearing loss.   Eyes:  Positive for blurred vision and double vision.  Respiratory: Negative.    Cardiovascular: Negative.   Gastrointestinal: Negative.   Genitourinary: Negative.   Musculoskeletal: Negative.   Skin: Negative.   Neurological:  Positive for headaches.       Word finding x 3 days  Headaches for 1-2 years  Endo/Heme/Allergies: Negative.   Psychiatric/Behavioral:  Negative.    All other systems reviewed and are negative.   Past Medical History:  Diagnosis Date   Abnormal gait 06/07/2009   Last Assessment & Plan:  She saw specialist who has given her orders for PT and she is wanting to have it setup for her at [facility] where she already is receiving care from at home   Acid reflux 02/20/2014   Aftercare following surgery 11/27/2015   Anemia of unknown etiology 10/18/2016   Anxiety 02/20/2014   Atrial fibrillation (HCC)    CAD (coronary artery disease)    Cerebral ischemia 03/11/2016   Overview:  Microvascular ischemia seen on MRI 01/2015 with partial empty sella turcica neurology appt 03/12/2016 pending with [facility], also has [facility] with PT coming to the house for her gait abnormality and lumbar DDD, as well as local ortho (durranni)    Chest pain 11/03/2018   CHF (congestive heart failure) (HCC) 06/24/2015   Last Assessment & Plan:  Relevant Hx: Course: Daily Update: Today's Plan:she has been doing well overall from her heart and no SOB  Electronically signed by: Krystal Clark, NP 06/26/15 1522   Chronic diastolic heart failure (HCC) 06/24/2015   Last Assessment & Plan:  Relevant Hx: Course: Daily Update: Today's Plan:she has been doing well overall from her heart and no SOB  Electronically signed by: Krystal Clark, NP 06/26/15 1522   Chronic UTI 06/27/2015   Last Assessment & Plan:  She requested a UA be checked for her as she  was having more frequency than before and it has been awhile since she was on oral abx for this, she is on low dose maintenance med to prevent and has done well with that.   CKD stage 3 due to type 2 diabetes mellitus (HCC) 02/24/2018   Colon polyps    COPD (chronic obstructive pulmonary disease) (HCC) 03/09/2012   Last Assessment & Plan:  She feels she is stable from this discussed with her the allergies she has and she is going to discuss with Dr. Blenda Nicely having allergy injections as well for this     Diabetes (HCC) 02/20/2014   Dizziness 02/07/2016   Overview:  Chronic and has been having falls, MRI showing chronic microvascular ischemia, as well as partial sella turcica pending neuro appt for her with [facility]   Dyslipidemia 07/29/2015   Edema 06/24/2015   Last Assessment & Plan:  Relevant Hx: Course: Daily Update: Today's Plan:this is stable for her overall and will follow for her  Electronically signed by: Krystal Clark, NP 06/27/15 0813   Elevated cholesterol    Essential hypertension 03/09/2012   Last Assessment & Plan:  On repeat this was stable for her and she does not check it at home much but when has been checked has been fine for her   Fistula 02/20/2014   Gastroesophageal reflux disease without esophagitis 02/20/2014   Last Assessment & Plan:  Relevant Hx: Course: Daily Update: Today's Plan:not on meds for this as she has had issues with her arms cramping and she is going to just drink buttermilk  Electronically signed by: Krystal Clark, NP 06/26/15 1530   Generalized abdominal pain 10/24/2016   Hallux valgus of left foot 12/04/2015   Hallux valgus of right foot 12/04/2015   High risk medication use 06/24/2015   History of recurrent UTIs 06/15/2019   Hx of valvular heart disease 01/29/2016   Hypertension 02/20/2014   Hypertensive heart disease with heart failure (HCC) 03/09/2012   Last Assessment & Plan:  On repeat this was stable for her and she does not check it at home much but when has been checked has been fine for her   Hypothyroidism (acquired) 03/09/2012   Last Assessment & Plan:  She was due for this to be updated and have advised her to get this drawn while here today   IBS (irritable bowel syndrome) 06/24/2015   Last Assessment & Plan:  Relevant Hx: Course: Daily Update: Today's Plan:this is stable for her  Electronically signed by: Krystal Clark, NP 06/26/15 1531   IBS (irritable bowel syndrome)    Idiopathic progressive polyneuropathy  06/07/2009   Left bundle branch block 06/24/2015   Leukocytosis 06/24/2015   Last Assessment & Plan:  Relevant Hx: Course: Daily Update: Today's Plan:this has been stable for her with FU through hematology who felt she was having benign swings in this and she chose to not have bone marrow biopsy done   Electronically signed by: Krystal Clark, NP 06/27/15 [phone]   Liposarcoma, 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 Sharion D[facility]), 8 x 6 x 3 cm. Local recurrence, re-excision 03/23/2007 Thedore Mins), 5 x 3 x 2 cm. Surveillance plan: baseline MRI April 2014  Last Assessment & Plan:  Relevant Hx: Course: Daily Update: Today's Plan:she is stable from this  Electronically signed by: Krystal Clark, NP 06/26/15 1533   Lumbar degenerative disc disease 11/06/2015   Last Assessment & Plan:  At  this point she needs updated MRI of her lumbar spine and we discussed with her gait abnormality she is likely going to have to see specialist at [facility] for this, she has seen local neuro for this several years ago in terms of her neuropathy and her falls and he really did not address it . She was advised several years ago to have back surgery locally which we did not feel was ideal with her sugars at the time being so out of control and now she is really more in control than then and it may be doable for her if felt  Needed though she would have to have cardiology clear her as well.   Mild CAD 07/29/2015   Overview:  Overview:  Cath march 2016 with 60% LAD with normal FFR   Overview:  Cath march 2016 with 60% LAD with normal FFR    Mixed hyperlipidemia 06/24/2015   Last Assessment & Plan:  Relevant Hx: Course: Daily Update: Today's Plan:update her lipids for her today and she is trying to watch her diet   Electronically signed by: Krystal Clark, NP 06/27/15 0813   Obstructive sleep apnea syndrome 06/24/2015   Onychomycosis due to  dermatophyte 11/27/2015   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   Last Assessment & Plan:  Relevant Hx: Course: Daily Update: Today's Plan:she is doing well overall  Electronically signed by: Krystal Clark, NP 06/26/15 1531   Pancreatitis    Pre-ulcerative calluses 12/04/2015   Pre-ulcerative corn or callous 11/27/2015   Primary osteoarthritis involving multiple joints 07/06/2015   Last Assessment & Plan:  Relevant Hx: Course: Daily Update: Today's Plan:she has DDD of her back with some spinal  Stenosis that she has seen ortho locally for and he wanted to do surgery which she has avoided and I agree with, she is going to use the flexeril for this right now as she is having more muscle spasm than what she has had in the past from her attempt to walk down some very steep steps that she was not used to . She should use her walker for support through the weekend and avoid overhead strain and lifting and stay in touch if not better.  Electronically signed by: Krystal Clark, NP 07/06/15 [phone]   Seasonal allergic rhinitis 01/12/2019   Sleep apnea    Stress incontinence of urine 04/17/2016   Syncope and collapse 10/13/2016   Thyroid disease    TIA (transient ischemic attack) 10/13/2016   Type 2 diabetes mellitus without complication, without long-term current use of insulin (HCC) 03/09/2012   Last Assessment & Plan:  She brings in readings of her sugars which show she has been doing much better overall with this at home and quite pleased with her   UTI (urinary tract infection)    Vascular calcification 01/29/2016   Visual changes 01/29/2016   Overview:  Following with Dr. Marina Gravel as well as had recent carotid doppler showing minimal plaque present and vertebral arteries normal , nothing signficant   Vitamin B12 deficiency 12/04/2015    Past Surgical History:  Procedure Laterality Date   ABDOMINAL HYSTERECTOMY     CHOLECYSTECTOMY     EYE SURGERY     PANCREAS SURGERY     skin cancer lesion        reports that she has never smoked. She has been exposed to tobacco smoke. Her smokeless tobacco use includes snuff. She reports that she does not drink alcohol and does not use drugs.  Allergies  Allergen Reactions   Doxycycline Nausea And Vomiting    Other Reaction(s): GI Intolerance   Nitrofurantoin     Other reaction(s): Cramps (ALLERGY/intolerance), Fever (intolerance), GI Upset (intolerance) Fever, weakness, nausea Fever, weakness, nausea   Statins     Other reaction(s): Other (See Comments) pancreatitis   Sulfamethoxazole Rash   Trimethoprim Rash    Family History  Problem Relation Age of Onset   Diabetes Mother    Hypertension Mother    Cancer Sister    Hypertension Father     Prior to Admission medications   Medication Sig Start Date End Date Taking? Authorizing Provider  albuterol (PROVENTIL) (2.5 MG/3ML) 0.083% nebulizer solution Take 2.5 mg by nebulization every 4 (four) hours as needed for wheezing or shortness of breath. 02/03/20   [provider]  albuterol (VENTOLIN HFA) 108 (90 Base) MCG/ACT inhaler Inhale 2 puffs into the lungs every 4 (four) hours as needed for wheezing or shortness of breath. 12/30/19   [provider]  Alirocumab (PRALUENT) 150 MG/ML SOAJ Inject 1 mL into the skin every 14 (fourteen) days. 08/04/22   Baldo Daub, MD  ALPRAZolam Prudy Feeler) 0.5 MG tablet Take 0.5 mg by mouth 3 (three) times daily as needed for anxiety.  10/13/16   [provider]  amLODipine (NORVASC) 5 MG tablet Take 2.5 mg by mouth daily.     [provider]  ascorbic acid (VITAMIN C) 1000 MG tablet Take 1,000 mg by mouth daily.    [provider]  aspirin EC 81 MG tablet Take 81 mg by mouth daily.    [provider]  benazepril (LOTENSIN) 20 MG tablet Take 20 mg by mouth daily. 05/26/17   [provider]  Blood Glucose Monitoring Suppl (ACCU-CHEK AVIVA PLUS) w/Device KIT See admin instructions. 10/07/16   [provider]  celecoxib (CELEBREX) 200 MG capsule Take 200 mg by mouth daily. 08/16/21   [provider]  Cholecalciferol 25 MCG (1000 UT) tablet Take 1,000 Units by mouth daily.    [provider]  cyanocobalamin (,VITAMIN B-12,) 1000 MCG/ML injection Inject into the muscle every 30 (thirty) days. 03/08/19   [provider]  D-MANNOSE PO Take 1 tablet by mouth daily.    [provider]  donepezil (ARICEPT) 5 MG tablet Take 5 mg by mouth at bedtime. 11/14/16   [provider]  fluticasone (FLONASE) 50 MCG/ACT nasal spray Place 1 spray into the nose 2 (two) times daily. 01/29/12   [provider]  furosemide (LASIX) 40 MG tablet Take 1 tablet (40 mg total) by mouth 2 (two) times daily. 09/25/21   Baldo Daub, MD  gabapentin (NEURONTIN) 300 MG capsule Take 300 mg by mouth daily. 11/14/16   [provider]  glimepiride (AMARYL) 1 MG tablet Take 0.5 tablets by mouth in the morning and at bedtime.  11/13/16   [provider]  insulin glargine (LANTUS) 100 UNIT/ML Solostar Pen Inject 10 units at HS may increase to max 40 units per day based upon blood sugars 05/21/18   [provider]  isosorbide mononitrate (IMDUR) 30 MG 24 hr tablet Take 1 tablet (30 mg total) by mouth daily. 09/14/20   Baldo Daub, MD  levothyroxine (SYNTHROID, LEVOTHROID) 75 MCG tablet Take 75 mcg by mouth daily. 11/14/16   [provider]  loratadine (CLARITIN) 10 MG tablet Take 10 mg by mouth daily as needed for allergies.    [provider]  metFORMIN (GLUCOPHAGE-XR) 500 MG 24  hr tablet Take 500 mg by mouth 2 (two) times daily. 11/14/16   [provider]  metoprolol tartrate (LOPRESSOR) 50 MG tablet Take 50 mg by mouth 2 (two) times daily.    [provider]  montelukast (SINGULAIR) 10 MG tablet Take 10 mg by mouth at bedtime. 11/17/16   [provider]  MYRBETRIQ 50 MG TB24 tablet Take 50 mg by mouth daily.  05/30/20   [provider]  NITRO-BID 2 % ointment Apply 1 inch topically at bedtime. 08/13/21   Baldo Daub, MD  nitroGLYCERIN (NITROSTAT) 0.4 MG SL tablet DISSOLVE 1 TABLET UNDER THE  TONGUE EVERY 5 MINUTES AS NEEDED FOR CHEST PAIN. MAX OF 3 TABLETS IN 15 MINUTES. CALL 911 IF PAIN  PERSISTS. 02/11/22   Baldo Daub, MD  PREMARIN vaginal cream 3 (three) times a week. 01/31/20   [provider]  SYMBICORT 160-4.5 MCG/ACT inhaler Inhale 2 puffs into the lungs 2 (two) times daily. 11/17/16   [provider]  traMADol (ULTRAM) 50 MG tablet Take 50 mg by mouth every 6 (six) hours as needed for moderate pain. 03/20/20   [provider]    Physical Exam: Vitals:   08/23/22 1900 08/23/22 2000 08/23/22 2100 08/23/22 2155  BP: (!) 134/50 (!) 129/50 (!) 142/60 (!) 149/63  Pulse: (!) 56 (!) 57 62 60  Resp: 16 19 18 20   Temp:   98 F (36.7 C) 98.2 F (36.8 C)  TempSrc:   Oral Oral  SpO2: 93% 95% 95% 94%  Weight:      Height:        Physical Exam Vitals and nursing note reviewed.  Constitutional:      General: She is not in acute distress.    Appearance: She is not toxic-appearing or diaphoretic.     Comments: Chronically ill appearing  HENT:     Head: Normocephalic and atraumatic.     Nose: Nose normal.  Eyes:     General: No scleral icterus. Cardiovascular:     Rate and Rhythm: Normal rate and regular rhythm.     Pulses: Normal pulses.  Pulmonary:     Effort: Pulmonary effort is normal. No respiratory distress.     Breath sounds: Normal breath sounds.  Abdominal:     General: Abdomen is flat. Bowel sounds are normal. There is no distension.     Palpations: Abdomen is soft.  Musculoskeletal:     Right lower leg: No edema.     Left lower leg: No edema.  Skin:    General: Skin is warm and dry.     Capillary Refill: Capillary refill takes less than 2 seconds.  Neurological:     General: No focal deficit present.     Mental Status: She is alert  and oriented to person, place, and time.     Comments: +4/5 bilateral hip flexor strength  5/5 hand grip strength bilaterally      Labs on Admission: I have personally reviewed following labs and imaging studies  CBC: Recent Labs  Lab 08/23/22 1242  WBC 14.3*  NEUTROABS 6.7  HGB 12.0  HCT 36.9  MCV 91.6  PLT 391   Basic Metabolic Panel: Recent Labs  Lab 08/23/22 1242  NA 130*  K 3.6  CL 90*  CO2 30  GLUCOSE 139*  BUN 27*  CREATININE 1.09*  CALCIUM 9.2   GFR: Estimated Creatinine Clearance: 34.7 mL/min (A) (by C-G formula based on SCr of 1.09 mg/dL (H)). Liver Function  Tests: Recent Labs  Lab 08/23/22 1242  AST 25  ALT 19  ALKPHOS 49  BILITOT 0.6  PROT 7.9  ALBUMIN 4.0   Coagulation Profile: Recent Labs  Lab 08/23/22 1242  INR 0.9   Urine analysis:    Component Value Date/Time   COLORURINE YELLOW 08/23/2022 1242   APPEARANCEUR CLEAR 08/23/2022 1242   LABSPEC 1.010 08/23/2022 1242   PHURINE 6.5 08/23/2022 1242   GLUCOSEU NEGATIVE 08/23/2022 1242   HGBUR NEGATIVE 08/23/2022 1242   BILIRUBINUR NEGATIVE 08/23/2022 1242   KETONESUR NEGATIVE 08/23/2022 1242   PROTEINUR NEGATIVE 08/23/2022 1242   NITRITE NEGATIVE 08/23/2022 1242   LEUKOCYTESUR NEGATIVE 08/23/2022 1242    Radiological Exams on Admission: I have personally reviewed images CT HEAD WO CONTRAST  Result Date: 08/23/2022 CLINICAL DATA:  Neuro deficit EXAM: CT HEAD WITHOUT CONTRAST TECHNIQUE: Contiguous axial images were obtained from the base of the skull through the vertex without intravenous contrast. RADIATION DOSE REDUCTION: This exam was performed according to the departmental dose-optimization program which includes automated exposure control, adjustment of the mA and/or kV according to patient size and/or use of iterative reconstruction technique. COMPARISON:  None Available. FINDINGS: Brain: No evidence of acute infarction, hemorrhage, hydrocephalus, extra-axial collection or mass  lesion/mass effect. Periventricular and deep white matter hypodensity. Vascular: No hyperdense vessel or unexpected calcification. Skull: Normal. Negative for fracture or focal lesion. Sinuses/Orbits: No acute finding. Other: None. IMPRESSION: No acute intracranial pathology. Small-vessel white matter disease. Electronically Signed   By: Jearld Lesch M.D.   On: 08/23/2022 13:25    EKG: My personal interpretation of EKG shows: sinus bradycardia    Assessment/Plan Principal Problem:   TIA (transient ischemic attack) Active Problems:   Chronic diastolic heart failure (HCC)   COPD (chronic obstructive pulmonary disease) (HCC)   Obstructive sleep apnea syndrome   Type 2 diabetes mellitus without complication, without long-term current use of insulin (HCC)   CKD stage 3a, GFR 45-59 ml/min (HCC)   Essential hypertension   Hyponatremia    Assessment and Plan: * TIA (transient ischemic attack) Observation med/tele bed. Check MRI brain/MRA head/neck. Check echo. Check lipid panel. Start plavix 75 mg daily and asa 81 mg daily. Pt already loaded with 300 mg plavix in ER. Allow for permissive hypertension for now. Keep BP <220/110  Hyponatremia Check tsh. Likely due to lasix use.  Essential hypertension Stable.  CKD stage 3a, GFR 45-59 ml/min (HCC) Stable. Avoid IV contrast if possible.  Type 2 diabetes mellitus without complication, without long-term current use of insulin (HCC) Hold metformin in the hospital. Check A1c. Continue with lantus.  Obstructive sleep apnea syndrome Continue with cpap at night. Pt states she uses supplemental O2 at night.  COPD (chronic obstructive pulmonary disease) (HCC) Stable. On RA  Chronic diastolic heart failure (HCC) Stable. Euvolemic.   DVT prophylaxis: SQ Heparin Code Status: Full Code Family Communication: discussed with pt, dtr donna and son-in-law alan at bedside  Disposition Plan: return home  Consults called: neurology  Admission  status: Observation, Telemetry bed   Carollee Herter, DO [facility] 08/23/2022, 11:18 PM

## 2022-08-23 NOTE — Assessment & Plan Note (Signed)
Hold metformin in the hospital. Check A1c. Continue with lantus.

## 2022-08-23 NOTE — ED Notes (Signed)
ED TO INPATIENT HANDOFF REPORT  ED Nurse Name and Phone #: Lynett Fish RN  S Name/Age/Gender Wendy Leon 89 y.o. female Room/Bed: MH03/MH03  Code Status   Code Status: Not on file  Home/SNF/Other Home Patient oriented to: self, place, time, and situation Is this baseline? Yes   Triage Complete: Triage complete  Chief Complaint CVA (cerebral vascular accident) [facility]) [I63.9]  Triage Note Pt's family reports several changes x 3 days, including double vision or "vision not right", periods of aphasia; HA, dizziness; denies HA now, A&O x 4   Allergies Allergies  Allergen Reactions   Doxycycline Nausea And Vomiting    Other Reaction(s): GI Intolerance   Nitrofurantoin     Other reaction(s): Cramps (ALLERGY/intolerance), Fever (intolerance), GI Upset (intolerance) Fever, weakness, nausea Fever, weakness, nausea   Statins     Other reaction(s): Other (See Comments) pancreatitis   Sulfamethoxazole Rash   Trimethoprim Rash    Level of Care/Admitting Diagnosis ED Disposition     ED Disposition  Admit   Condition  --   Comment  Hospital Area: [facility] [[id number]]  Level of Care: Progressive [102]  Admit to Progressive based on following criteria: NEUROLOGICAL AND NEUROSURGICAL complex patients with significant risk of instability, who do not meet ICU criteria, yet require close observation or frequent assessment (< / = every 2 - 4 hours) with medical / nursing intervention.  Interfacility transfer: Yes  May place patient in observation at [facility] or [facility] if equivalent level of care is available:: Yes  Covid Evaluation: Asymptomatic - no recent exposure (last 10 days) testing not required  Diagnosis: CVA (cerebral vascular accident) [facility]) [[id number]]  Admitting Physician: Chevis Pretty  Attending Physician: Chevis Pretty          B Medical/Surgery History Past Medical History:  Diagnosis Date   Abnormal gait  06/07/2009   Last Assessment & Plan:  She saw specialist who has given her orders for PT and she is wanting to have it setup for her at [facility] where she already is receiving care from at home   Acid reflux 02/20/2014   Aftercare following surgery 11/27/2015   Anemia of unknown etiology 10/18/2016   Anxiety 02/20/2014   Atrial fibrillation (HCC)    CAD (coronary artery disease)    Cerebral ischemia 03/11/2016   Overview:  Microvascular ischemia seen on MRI 01/2015 with partial empty sella turcica neurology appt 03/12/2016 pending with [facility], also [facility] with PT coming to the house for her gait abnormality and lumbar DDD, as well as local ortho (durranni)    Chest pain 11/03/2018   CHF (congestive heart failure) (HCC) 06/24/2015   Last Assessment & Plan:  Relevant Hx: Course: Daily Update: Today's Plan:she has been doing well overall from her heart and no SOB  Electronically signed by: Krystal Clark, NP 06/26/15 1522   Chronic diastolic heart failure (HCC) 06/24/2015   Last Assessment & Plan:  Relevant Hx: Course: Daily Update: Today's Plan:she has been doing well overall from her heart and no SOB  Electronically signed by: Krystal Clark, NP 06/26/15 1522   Chronic UTI 06/27/2015   Last Assessment & Plan:  She requested a UA be checked for her as she was having more frequency than before and it has been awhile since she was on oral abx for this, she is on low dose maintenance med to prevent and has done well with that.   CKD stage 3  due to type 2 diabetes mellitus (HCC) 02/24/2018   Colon polyps    COPD (chronic obstructive pulmonary disease) (HCC) 03/09/2012   Last Assessment & Plan:  She feels she is stable from this discussed with her the allergies she has and she is going to discuss with Dr. Blenda Nicely having allergy injections as well for this    Diabetes (HCC) 02/20/2014   Dizziness 02/07/2016   Overview:  Chronic and has been having falls, MRI showing chronic  microvascular ischemia, as well as partial sella turcica pending neuro appt for her with [facility]   Dyslipidemia 07/29/2015   Edema 06/24/2015   Last Assessment & Plan:  Relevant Hx: Course: Daily Update: Today's Plan:this is stable for her overall and will follow for her  Electronically signed by: Krystal Clark, NP 06/27/15 0813   Elevated cholesterol    Essential hypertension 03/09/2012   Last Assessment & Plan:  On repeat this was stable for her and she does not check it at home much but when has been checked has been fine for her   Fistula 02/20/2014   Gastroesophageal reflux disease without esophagitis 02/20/2014   Last Assessment & Plan:  Relevant Hx: Course: Daily Update: Today's Plan:not on meds for this as she has had issues with her arms cramping and she is going to just drink buttermilk  Electronically signed by: Krystal Clark, NP 06/26/15 1530   Generalized abdominal pain 10/24/2016   Hallux valgus of left foot 12/04/2015   Hallux valgus of right foot 12/04/2015   High risk medication use 06/24/2015   History of recurrent UTIs 06/15/2019   Hx of valvular heart disease 01/29/2016   Hypertension 02/20/2014   Hypertensive heart disease with heart failure (HCC) 03/09/2012   Last Assessment & Plan:  On repeat this was stable for her and she does not check it at home much but when has been checked has been fine for her   Hypothyroidism (acquired) 03/09/2012   Last Assessment & Plan:  She was due for this to be updated and have advised her to get this drawn while here today   IBS (irritable bowel syndrome) 06/24/2015   Last Assessment & Plan:  Relevant Hx: Course: Daily Update: Today's Plan:this is stable for her  Electronically signed by: Krystal Clark, NP 06/26/15 1531   IBS (irritable bowel syndrome)    Idiopathic progressive polyneuropathy 06/07/2009   Left bundle branch block 06/24/2015   Leukocytosis 06/24/2015   Last Assessment & Plan:  Relevant Hx: Course:  Daily Update: Today's Plan:this has been stable for her with FU through hematology who felt she was having benign swings in this and she chose to not have bone marrow biopsy done   Electronically signed by: Krystal Clark, NP 06/27/15 [phone]   Liposarcoma, 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 Sharion Dove [facility]), 8 x 6 x 3 cm. Local recurrence, re-excision 03/23/2007 [facility]), 5 x 3 x 2 cm. Surveillance plan: baseline MRI April 2014  Last Assessment & Plan:  Relevant Hx: Course: Daily Update: Today's Plan:she is stable from this  Electronically signed by: Krystal Clark, NP 06/26/15 1533   Lumbar degenerative disc disease 11/06/2015   Last Assessment & Plan:  At this point she needs updated MRI of her lumbar spine and we discussed with her gait abnormality she is likely going to have to see specialist at [facility] for this, she has seen local neuro for this several years  ago in terms of her neuropathy and her falls and he really did not address it . She was advised several years ago to have back surgery locally which we did not feel was ideal with her sugars at the time being so out of control and now she is really more in control than then and it may be doable for her if felt  Needed though she would have to have cardiology clear her as well.   Mild CAD 07/29/2015   Overview:  Overview:  Cath march 2016 with 60% LAD with normal FFR   Overview:  Cath march 2016 with 60% LAD with normal FFR    Mixed hyperlipidemia 06/24/2015   Last Assessment & Plan:  Relevant Hx: Course: Daily Update: Today's Plan:update her lipids for her today and she is trying to watch her diet   Electronically signed by: Krystal Clark, NP 06/27/15 0813   Obstructive sleep apnea syndrome 06/24/2015   Onychomycosis due to dermatophyte 11/27/2015   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   Last Assessment & Plan:  Relevant Hx: Course: Daily Update: Today's  Plan:she is doing well overall  Electronically signed by: Krystal Clark, NP 06/26/15 1531   Pancreatitis    Pre-ulcerative calluses 12/04/2015   Pre-ulcerative corn or callous 11/27/2015   Primary osteoarthritis involving multiple joints 07/06/2015   Last Assessment & Plan:  Relevant Hx: Course: Daily Update: Today's Plan:she has DDD of her back with some spinal  Stenosis that she has seen ortho locally for and he wanted to do surgery which she has avoided and I agree with, she is going to use the flexeril for this right now as she is having more muscle spasm than what she has had in the past from her attempt to walk down some very steep steps that she was not used to . She should use her walker for support through the weekend and avoid overhead strain and lifting and stay in touch if not better.  Electronically signed by: Krystal Clark, NP 07/06/15 [phone]   Seasonal allergic rhinitis 01/12/2019   Sleep apnea    Stress incontinence of urine 04/17/2016   Syncope and collapse 10/13/2016   Thyroid disease    TIA (transient ischemic attack) 10/13/2016   Type 2 diabetes mellitus without complication, without long-term current use of insulin (HCC) 03/09/2012   Last Assessment & Plan:  She brings in readings of her sugars which show she has been doing much better overall with this at home and quite pleased with her   UTI (urinary tract infection)    Vascular calcification 01/29/2016   Visual changes 01/29/2016   Overview:  Following with Dr. Marina Gravel as well as had recent carotid doppler showing minimal plaque present and vertebral arteries normal , nothing signficant   Vitamin B12 deficiency 12/04/2015   Past Surgical History:  Procedure Laterality Date   ABDOMINAL HYSTERECTOMY     CHOLECYSTECTOMY     EYE SURGERY     PANCREAS SURGERY     skin cancer lesion       A IV Location/Drains/Wounds Patient Lines/Drains/Airways Status     Active Line/Drains/Airways     Name Placement  date Placement time Site Days   Peripheral IV 08/23/22 20 G Right Antecubital 08/23/22  1255  Antecubital  less than 1            Intake/Output Last 24 hours No intake or output data in the 24 hours ending 08/23/22 2114  Labs/Imaging Results for orders placed or  performed during the hospital encounter of 08/23/22 (from the past 48 hour(s))  Ethanol     Status: None   Collection Time: 08/23/22 12:42 PM  Result Value Ref Range   Alcohol, Ethyl (B) <10 <10 mg/dL    Comment: (NOTE) Lowest detectable limit for serum alcohol is 10 mg/dL.  For medical purposes only. Performed at [facility], [address] [place], [place] [address]   Protime-INR     Status: None   Collection Time: 08/23/22 12:42 PM  Result Value Ref Range   Prothrombin Time 12.4 11.4 - 15.2 seconds   INR 0.9 0.8 - 1.2    Comment: (NOTE) INR goal varies based on device and disease states. Performed at [facility], [address] [place], [place] [address]   APTT     Status: Abnormal   Collection Time: 08/23/22 12:42 PM  Result Value Ref Range   aPTT 37 (H) 24 - 36 seconds    Comment:        IF BASELINE aPTT IS ELEVATED, SUGGEST PATIENT RISK ASSESSMENT BE USED TO DETERMINE APPROPRIATE ANTICOAGULANT THERAPY. Performed at [facility], [address] [place], [place] [address]   CBC     Status: Abnormal   Collection Time: 08/23/22 12:42 PM  Result Value Ref Range   WBC 14.3 (H) 4.0 - 10.5 K/uL   RBC 4.03 3.87 - 5.11 MIL/uL   Hemoglobin 12.0 12.0 - 15.0 g/dL   HCT 44.0 10.2 - 72.5 %   MCV 91.6 80.0 - 100.0 fL   MCH 29.8 26.0 - 34.0 pg   MCHC 32.5 30.0 - 36.0 g/dL   RDW 36.6 44.0 - 34.7 %   Platelets 391 150 - 400 K/uL   nRBC 0.0 0.0 - 0.2 %    Comment: Performed at [facility], [address] [place], [place] [address]  Differential     Status: Abnormal   Collection Time: 08/23/22 12:42 PM  Result Value Ref Range   Neutrophils Relative % 47 %    Neutro Abs 6.7 1.7 - 7.7 K/uL   Lymphocytes Relative 37 %   Lymphs Abs 5.3 (H) 0.7 - 4.0 K/uL   Monocytes Relative 13 %   Monocytes Absolute 1.9 (H) 0.1 - 1.0 K/uL   Eosinophils Relative 2 %   Eosinophils Absolute 0.3 0.0 - 0.5 K/uL   Basophils Relative 1 %   Basophils Absolute 0.1 0.0 - 0.1 K/uL   Immature Granulocytes 0 %   Abs Immature Granulocytes 0.04 0.00 - 0.07 K/uL    Comment: Performed at [facility], [address] [place], [place] [address]  Comprehensive metabolic panel     Status: Abnormal   Collection Time: 08/23/22 12:42 PM  Result Value Ref Range   Sodium 130 (L) 135 - 145 mmol/L   Potassium 3.6 3.5 - 5.1 mmol/L   Chloride 90 (L) 98 - 111 mmol/L   CO2 30 22 - 32 mmol/L   Glucose, Bld 139 (H) 70 - 99 mg/dL    Comment: Glucose reference range applies only to samples taken after fasting for at least 8 hours.   BUN 27 (H) 8 - 23 mg/dL   Creatinine, Ser 6.43 (H) 0.44 - 1.00 mg/dL   Calcium 9.2 8.9 - 32.9 mg/dL   Total Protein 7.9 6.5 - 8.1 g/dL   Albumin 4.0 3.5 - 5.0 g/dL   AST 25 15 - 41 U/L  ALT 19 0 - 44 U/L   Alkaline Phosphatase 49 38 - 126 U/L   Total Bilirubin 0.6 0.3 - 1.2 mg/dL   GFR, Estimated 49 (L) >60 mL/min    Comment: (NOTE) Calculated using the CKD-EPI Creatinine Equation (2021)    Anion gap 10 5 - 15    Comment: Performed at [facility], [address] [place], [place] [address]  Urine rapid drug screen (hosp performed)     Status: None   Collection Time: 08/23/22 12:42 PM  Result Value Ref Range   Opiates NONE DETECTED NONE DETECTED   Cocaine NONE DETECTED NONE DETECTED   Benzodiazepines NONE DETECTED NONE DETECTED   Amphetamines NONE DETECTED NONE DETECTED   Tetrahydrocannabinol NONE DETECTED NONE DETECTED   Barbiturates NONE DETECTED NONE DETECTED    Comment: (NOTE) DRUG SCREEN FOR MEDICAL PURPOSES ONLY.  IF CONFIRMATION IS NEEDED FOR ANY PURPOSE, NOTIFY LAB WITHIN 5 DAYS.  LOWEST DETECTABLE LIMITS FOR  URINE DRUG SCREEN Drug Class                     Cutoff (ng/mL) Amphetamine and metabolites    1000 Barbiturate and metabolites    200 Benzodiazepine                 200 Opiates and metabolites        300 Cocaine and metabolites        300 THC                            50 Performed at [facility], [address] [place], [place] [address]   Urinalysis, Routine w reflex microscopic -Urine, Clean Catch     Status: None   Collection Time: 08/23/22 12:42 PM  Result Value Ref Range   Color, Urine YELLOW YELLOW   APPearance CLEAR CLEAR   Specific Gravity, Urine 1.010 1.005 - 1.030   pH 6.5 5.0 - 8.0   Glucose, UA NEGATIVE NEGATIVE mg/dL   Hgb urine dipstick NEGATIVE NEGATIVE   Bilirubin Urine NEGATIVE NEGATIVE   Ketones, ur NEGATIVE NEGATIVE mg/dL   Protein, ur NEGATIVE NEGATIVE mg/dL   Nitrite NEGATIVE NEGATIVE   Leukocytes,Ua NEGATIVE NEGATIVE    Comment: Microscopic not done on urines with negative protein, blood, leukocytes, nitrite, or glucose < 500 mg/dL. Performed at [facility], [address] [place], [place] [address]    CT HEAD WO CONTRAST  Result Date: 08/23/2022 CLINICAL DATA:  Neuro deficit EXAM: CT HEAD WITHOUT CONTRAST TECHNIQUE: Contiguous axial images were obtained from the base of the skull through the vertex without intravenous contrast. RADIATION DOSE REDUCTION: This exam was performed according to the departmental dose-optimization program which includes automated exposure control, adjustment of the mA and/or kV according to patient size and/or use of iterative reconstruction technique. COMPARISON:  None Available. FINDINGS: Brain: No evidence of acute infarction, hemorrhage, hydrocephalus, extra-axial collection or mass lesion/mass effect. Periventricular and deep white matter hypodensity. Vascular: No hyperdense vessel or unexpected calcification. Skull: Normal. Negative for fracture or focal lesion. Sinuses/Orbits: No acute finding. Other:  None. IMPRESSION: No acute intracranial pathology. Small-vessel white matter disease. Electronically Signed   By: Jearld Lesch M.D.   On: 08/23/2022 13:25    Pending Labs Unresulted Labs (From admission, onward)    None       Vitals/Pain Today's Vitals   08/23/22 1857 08/23/22 1900 08/23/22 2000 08/23/22  2100  BP: (!) 129/51 (!) 134/50 (!) 129/50 (!) 142/60  Pulse: (!) 56 (!) 56 (!) 57 62  Resp: 18 16 19 18   Temp: 98 F (36.7 C)   98 F (36.7 C)  TempSrc: Oral   Oral  SpO2: 96% 93% 95% 95%  Weight:      Height:      PainSc: 0-No pain       Isolation Precautions No active isolations  Medications Medications  clopidogrel (PLAVIX) tablet 300 mg (300 mg Oral Given 08/23/22 1628)    Mobility non-ambulatory     Focused Assessments See documentation for NIH   R Recommendations: See Admitting Provider Note  Report given to:   Additional Notes: .

## 2022-08-23 NOTE — Assessment & Plan Note (Signed)
Stable. Euvolemic. ?

## 2022-08-24 ENCOUNTER — Observation Stay (HOSPITAL_COMMUNITY): Payer: Medicare Other

## 2022-08-24 ENCOUNTER — Observation Stay (HOSPITAL_BASED_OUTPATIENT_CLINIC_OR_DEPARTMENT_OTHER): Payer: Medicare Other

## 2022-08-24 ENCOUNTER — Encounter (HOSPITAL_COMMUNITY): Payer: Self-pay | Admitting: Family Medicine

## 2022-08-24 DIAGNOSIS — I361 Nonrheumatic tricuspid (valve) insufficiency: Secondary | ICD-10-CM

## 2022-08-24 DIAGNOSIS — G9341 Metabolic encephalopathy: Secondary | ICD-10-CM | POA: Diagnosis not present

## 2022-08-24 DIAGNOSIS — I639 Cerebral infarction, unspecified: Secondary | ICD-10-CM

## 2022-08-24 DIAGNOSIS — I674 Hypertensive encephalopathy: Secondary | ICD-10-CM

## 2022-08-24 DIAGNOSIS — I10 Essential (primary) hypertension: Secondary | ICD-10-CM | POA: Diagnosis not present

## 2022-08-24 DIAGNOSIS — G459 Transient cerebral ischemic attack, unspecified: Secondary | ICD-10-CM | POA: Diagnosis not present

## 2022-08-24 LAB — CBC WITH DIFFERENTIAL/PLATELET
Abs Immature Granulocytes: 0.06 10*3/uL (ref 0.00–0.07)
Basophils Absolute: 0.1 10*3/uL (ref 0.0–0.1)
Basophils Relative: 1 %
Eosinophils Absolute: 0.3 10*3/uL (ref 0.0–0.5)
Eosinophils Relative: 2 %
HCT: 36.1 % (ref 36.0–46.0)
Hemoglobin: 12.4 g/dL (ref 12.0–15.0)
Immature Granulocytes: 0 %
Lymphocytes Relative: 34 %
Lymphs Abs: 5.2 10*3/uL — ABNORMAL HIGH (ref 0.7–4.0)
MCH: 30.3 pg (ref 26.0–34.0)
MCHC: 34.3 g/dL (ref 30.0–36.0)
MCV: 88.3 fL (ref 80.0–100.0)
Monocytes Absolute: 1.9 10*3/uL — ABNORMAL HIGH (ref 0.1–1.0)
Monocytes Relative: 12 %
Neutro Abs: 7.9 10*3/uL — ABNORMAL HIGH (ref 1.7–7.7)
Neutrophils Relative %: 51 %
Platelets: 413 10*3/uL — ABNORMAL HIGH (ref 150–400)
RBC: 4.09 MIL/uL (ref 3.87–5.11)
RDW: 13 % (ref 11.5–15.5)
WBC: 15.4 10*3/uL — ABNORMAL HIGH (ref 4.0–10.5)
nRBC: 0 % (ref 0.0–0.2)

## 2022-08-24 LAB — LIPID PANEL
Cholesterol: 190 mg/dL (ref 0–200)
HDL: 66 mg/dL (ref >40)
LDL Cholesterol: 107 mg/dL — ABNORMAL HIGH (ref 0–99)
Total CHOL/HDL Ratio: 2.9 RATIO
Triglycerides: 87 mg/dL (ref <150)
VLDL: 17 mg/dL (ref 0–40)

## 2022-08-24 LAB — COMPREHENSIVE METABOLIC PANEL
ALT: 22 U/L (ref 0–44)
AST: 21 U/L (ref 15–41)
Albumin: 3.7 g/dL (ref 3.5–5.0)
Alkaline Phosphatase: 47 U/L (ref 38–126)
Anion gap: 14 (ref 5–15)
BUN: 28 mg/dL — ABNORMAL HIGH (ref 8–23)
CO2: 26 mmol/L (ref 22–32)
Calcium: 9.6 mg/dL (ref 8.9–10.3)
Chloride: 94 mmol/L — ABNORMAL LOW (ref 98–111)
Creatinine, Ser: 1.23 mg/dL — ABNORMAL HIGH (ref 0.44–1.00)
GFR, Estimated: 42 mL/min — ABNORMAL LOW (ref >60)
Glucose, Bld: 143 mg/dL — ABNORMAL HIGH (ref 70–99)
Potassium: 3.4 mmol/L — ABNORMAL LOW (ref 3.5–5.1)
Sodium: 134 mmol/L — ABNORMAL LOW (ref 135–145)
Total Bilirubin: 0.7 mg/dL (ref 0.3–1.2)
Total Protein: 7.1 g/dL (ref 6.5–8.1)

## 2022-08-24 LAB — TSH: TSH: 3.595 u[IU]/mL (ref 0.350–4.500)

## 2022-08-24 LAB — AMMONIA: Ammonia: 27 umol/L (ref 9–35)

## 2022-08-24 LAB — GLUCOSE, CAPILLARY
Glucose-Capillary: 221 mg/dL — ABNORMAL HIGH (ref 70–99)
Glucose-Capillary: 64 mg/dL — ABNORMAL LOW (ref 70–99)
Glucose-Capillary: 194 mg/dL — ABNORMAL HIGH (ref 70–99)
Glucose-Capillary: 193 mg/dL — ABNORMAL HIGH (ref 70–99)

## 2022-08-24 LAB — MAGNESIUM: Magnesium: 2 mg/dL (ref 1.7–2.4)

## 2022-08-24 LAB — HEMOGLOBIN A1C
Hgb A1c MFr Bld: 6.7 % — ABNORMAL HIGH (ref 4.8–5.6)
Mean Plasma Glucose: 145.59 mg/dL

## 2022-08-24 MED ORDER — GABAPENTIN 300 MG PO CAPS
300.0000 mg | ORAL_CAPSULE | Freq: Every day | ORAL | Status: DC
  Administered 2022-08-24: 300 mg via ORAL
  Filled 2022-08-24: qty 1

## 2022-08-24 MED ORDER — INSULIN ASPART 100 UNIT/ML IJ SOLN
0.0000 [IU] | Freq: Three times a day (TID) | INTRAMUSCULAR | Status: DC
  Administered 2022-08-24: 3 [IU] via SUBCUTANEOUS
  Administered 2022-08-24: 2 [IU] via SUBCUTANEOUS
  Administered 2022-08-25: 1 [IU] via SUBCUTANEOUS

## 2022-08-24 MED ORDER — FLUTICASONE PROPIONATE 50 MCG/ACT NA SUSP
1.0000 | Freq: Two times a day (BID) | NASAL | Status: DC
  Administered 2022-08-24 – 2022-08-25 (×2): 1 via NASAL
  Filled 2022-08-24: qty 16

## 2022-08-24 MED ORDER — POTASSIUM CHLORIDE CRYS ER 20 MEQ PO TBCR
40.0000 meq | EXTENDED_RELEASE_TABLET | Freq: Once | ORAL | Status: AC
  Administered 2022-08-24: 40 meq via ORAL
  Filled 2022-08-24: qty 2

## 2022-08-24 MED ORDER — ISOSORBIDE MONONITRATE ER 30 MG PO TB24
30.0000 mg | ORAL_TABLET | Freq: Every day | ORAL | Status: DC
  Administered 2022-08-25: 30 mg via ORAL
  Filled 2022-08-24: qty 1

## 2022-08-24 MED ORDER — DONEPEZIL HCL 5 MG PO TABS
5.0000 mg | ORAL_TABLET | Freq: Every day | ORAL | Status: DC
  Administered 2022-08-24: 5 mg via ORAL
  Filled 2022-08-24: qty 1

## 2022-08-24 MED ORDER — FUROSEMIDE 40 MG PO TABS
40.0000 mg | ORAL_TABLET | Freq: Two times a day (BID) | ORAL | Status: DC
  Administered 2022-08-24 – 2022-08-25 (×2): 40 mg via ORAL
  Filled 2022-08-24 (×2): qty 1

## 2022-08-24 MED ORDER — METOPROLOL TARTRATE 50 MG PO TABS
50.0000 mg | ORAL_TABLET | Freq: Two times a day (BID) | ORAL | Status: DC
  Administered 2022-08-24 – 2022-08-25 (×3): 50 mg via ORAL
  Filled 2022-08-24 (×3): qty 1

## 2022-08-24 MED ORDER — EZETIMIBE 10 MG PO TABS
10.0000 mg | ORAL_TABLET | Freq: Every day | ORAL | Status: DC
  Administered 2022-08-24 – 2022-08-25 (×2): 10 mg via ORAL
  Filled 2022-08-24 (×2): qty 1

## 2022-08-24 MED ORDER — MOMETASONE FURO-FORMOTEROL FUM 200-5 MCG/ACT IN AERO
2.0000 | INHALATION_SPRAY | Freq: Two times a day (BID) | RESPIRATORY_TRACT | Status: DC
  Administered 2022-08-24 – 2022-08-25 (×2): 2 via RESPIRATORY_TRACT
  Filled 2022-08-24: qty 8.8

## 2022-08-24 MED ORDER — INSULIN GLARGINE-YFGN 100 UNIT/ML ~~LOC~~ SOLN
10.0000 [IU] | Freq: Every day | SUBCUTANEOUS | Status: DC
  Administered 2022-08-24 – 2022-08-25 (×2): 10 [IU] via SUBCUTANEOUS
  Filled 2022-08-24 (×2): qty 0.1

## 2022-08-24 NOTE — Progress Notes (Signed)
Pt c/o poor sleep and requests her home dose of gabapentin, alprazolam, and aricept. Alprazolam and aricept ordered. Sent SM message to Dr. Arville Care and he advised RN to order gabapentin.

## 2022-08-24 NOTE — Progress Notes (Signed)
PROGRESS NOTE        PATIENT DETAILS Name: Wendy Leon Age: 89 y.o. Sex: female Date of Birth: 30-Jan-1933 Admit Date: 08/23/2022 Admitting Physician Willeen Niece, MD RUE:AVWUJW, Evlyn Courier., MD  Brief Summary: Patient is a 89 y.o.  female history of TIA, HFpEF, DM-2, HTN, CKD stage IIIa, COPD who presented with diplopia/dysarthria/word finding difficulty.  Admitted to [facility] service for further workup.  Significant events: 4/27>> admit to [facility]  Significant studies: 4/28>> MRI brain: No CVA 4/28>> MRI brain: Moderate/severe stenosis of distal right V4, right P2-and distal left P3. 4/28>> LDL: 107 4/28>> A1c: 6.7 4/28>> echo: Pending 4/28>> carotid Doppler: Pending  Significant microbiology data: None  Procedures: None  Consults: Neuro  Subjective: Lying comfortably in bed-denies any chest pain or shortness of breath.  She is back to baseline-diplopia/word finding difficulty has resolved.  Objective: Vitals: Blood pressure (!) 132/46, pulse 60, temperature 98.3 F (36.8 C), temperature source Oral, resp. rate 16, height 5\' 4"  (1.626 m), weight 72.1 kg, SpO2 95 %.   Exam: Gen Exam:Alert awake-not in any distress HEENT:atraumatic, normocephalic Chest: B/L clear to auscultation anteriorly CVS:S1S2 regular Abdomen:soft non tender, non distended Extremities:no edema Neurology: Non focal Skin: no rash  Pertinent Labs/Radiology:    Latest Ref Rng & Units 08/24/2022    2:31 AM 08/23/2022   12:42 PM 07/13/2020    9:46 AM  CBC  WBC 4.0 - 10.5 K/uL 15.4  14.3  14.7   Hemoglobin 12.0 - 15.0 g/dL 11.9  14.7  82.9   Hematocrit 36.0 - 46.0 % 36.1  36.9  35.1   Platelets 150 - 400 K/uL 413  391  341     Lab Results  Component Value Date   NA 134 (L) 08/24/2022   K 3.4 (L) 08/24/2022   CL 94 (L) 08/24/2022   CO2 26 08/24/2022      Assessment/Plan: ?  TIA Presented with transient word finding difficulty/diplopia-the symptoms have completely  resolved.  Unclear whether this was a TIA or from encephalopathy of an unknown cause. Neuroimaging negative Workup as above Neurology-Dr. Xu-recommending to continue aspirin as previous.  She is intolerant to statin and gets PCSK9 inhibitor injections.  Await echo/carotid Doppler-if no major issues-likely will go home later today.  DM-2 Resume Lantus/Amaryl  HTN Resume metoprolol Resume rest of antihypertensives when medication reconciliation completed.  Chronic HFpEF Euvolemic Continue diuretic regimen  Hypothyroidism Continue levothyroxine-TSH stable  CKD stage IIIb  at baseline  COPD Stable Continue inhalers  OSA CPAP nightly   BMI: Estimated body mass index is 27.29 kg/m as calculated from the following:   Height as of this encounter: 5\' 4"  (1.626 m).   Weight as of this encounter: 72.1 kg.   Code status:   Code Status: Full Code   DVT Prophylaxis: heparin injection 5,000 Units Start: 08/24/22 0030 SCD's Start: 08/23/22 2335   Family Communication: Daughter at bedside   Disposition Plan: Status is: Observation The patient remains OBS appropriate and will d/c before 2 midnights.   Planned Discharge Destination:Home health   Diet: Diet Order             Diet heart healthy/carb modified Room service appropriate? Yes; Fluid consistency: Thin  Diet effective now                     Antimicrobial agents:  Anti-infectives (From admission, onward)    None        MEDICATIONS: Scheduled Meds:  aspirin EC  81 mg Oral Daily   ezetimibe  10 mg Oral Daily   heparin  5,000 Units Subcutaneous Q8H   insulin aspart  0-9 Units Subcutaneous TID WC   insulin glargine-yfgn  10 Units Subcutaneous Daily   levothyroxine  75 mcg Oral Q0600   Continuous Infusions: PRN Meds:.acetaminophen **OR** acetaminophen (TYLENOL) oral liquid 160 mg/5 mL **OR** acetaminophen, ALPRAZolam, ondansetron (ZOFRAN) IV   I have personally reviewed following labs and  imaging studies  LABORATORY DATA: CBC: Recent Labs  Lab 08/23/22 1242 08/24/22 0231  WBC 14.3* 15.4*  NEUTROABS 6.7 7.9*  HGB 12.0 12.4  HCT 36.9 36.1  MCV 91.6 88.3  PLT 391 413*    Basic Metabolic Panel: Recent Labs  Lab 08/23/22 1242 08/24/22 0231  NA 130* 134*  K 3.6 3.4*  CL 90* 94*  CO2 30 26  GLUCOSE 139* 143*  BUN 27* 28*  CREATININE 1.09* 1.23*  CALCIUM 9.2 9.6  MG  --  2.0    GFR: Estimated Creatinine Clearance: 30.8 mL/min (A) (by C-G formula based on SCr of 1.23 mg/dL (H)).  Liver Function Tests: Recent Labs  Lab 08/23/22 1242 08/24/22 0231  AST 25 21  ALT 19 22  ALKPHOS 49 47  BILITOT 0.6 0.7  PROT 7.9 7.1  ALBUMIN 4.0 3.7   No results for input(s): "LIPASE", "AMYLASE" in the last 168 hours. Recent Labs  Lab 08/24/22 0805  AMMONIA 27    Coagulation Profile: Recent Labs  Lab 08/23/22 1242  INR 0.9    Cardiac Enzymes: No results for input(s): "CKTOTAL", "CKMB", "CKMBINDEX", "TROPONINI" in the last 168 hours.  BNP (last 3 results) No results for input(s): "PROBNP" in the last 8760 hours.  Lipid Profile: Recent Labs    08/24/22 0231  CHOL 190  HDL 66  LDLCALC 107*  TRIG 87  CHOLHDL 2.9    Thyroid Function Tests: Recent Labs    08/24/22 0231  TSH 3.595    Anemia Panel: No results for input(s): "VITAMINB12", "FOLATE", "FERRITIN", "TIBC", "IRON", "RETICCTPCT" in the last 72 hours.  Urine analysis:    Component Value Date/Time   COLORURINE YELLOW 08/23/2022 1242   APPEARANCEUR CLEAR 08/23/2022 1242   LABSPEC 1.010 08/23/2022 1242   PHURINE 6.5 08/23/2022 1242   GLUCOSEU NEGATIVE 08/23/2022 1242   HGBUR NEGATIVE 08/23/2022 1242   BILIRUBINUR NEGATIVE 08/23/2022 1242   KETONESUR NEGATIVE 08/23/2022 1242   PROTEINUR NEGATIVE 08/23/2022 1242   NITRITE NEGATIVE 08/23/2022 1242   LEUKOCYTESUR NEGATIVE 08/23/2022 1242    Sepsis Labs: Lactic Acid, Venous No results found for: "LATICACIDVEN"  MICROBIOLOGY: No  results found for this or any previous visit (from the past 240 hour(s)).  RADIOLOGY STUDIES/RESULTS: MR BRAIN WO CONTRAST  Result Date: 08/24/2022 CLINICAL DATA:  Initial evaluation for TIA. EXAM: MRI HEAD WITHOUT CONTRAST MRA HEAD WITHOUT CONTRAST TECHNIQUE: Multiplanar, multi-echo pulse sequences of the brain and surrounding structures were acquired without intravenous contrast. Angiographic images of the Circle of Willis were acquired using MRA technique without intravenous contrast. COMPARISON:  Prior CT from 08/23/2022. FINDINGS: MRI HEAD FINDINGS Brain: Cerebral volume within normal limits for age. T2/FLAIR hyperintensity involving the periventricular deep white matter both cerebral hemispheres, most like related chronic microvascular ischemic disease, overall mild in nature and less than is typically seen for age. No evidence for acute or subacute infarct. Gray-white matter differentiation maintained. No areas of  chronic cortical infarction. No acute or chronic intracranial blood products. No mass lesion, midline shift or mass effect. No hydrocephalus or extra-axial fluid collection. Pituitary gland and suprasellar region within normal limits. Vascular: Major intracranial vascular flow voids are maintained. Skull and upper cervical spine: Craniocervical junction within normal limits. Bone marrow signal intensity normal. No scalp soft tissue abnormality. Sinuses/Orbits: Patient status post bilateral ocular lens replacement. Paranasal sinuses are largely clear. Trace right mastoid effusion, of doubtful significance. Other: None. MRA HEAD FINDINGS Anterior circulation: Visualized distal cervical segments of the internal carotid arteries are patent with antegrade flow. Atheromatous irregularity throughout the carotid siphons without hemodynamically significant stenosis. Tiny 2 mm outpouching extending medially from the cavernous right ICA suspicious for a small aneurysm (series 9, image 72). A1 segments  patent bilaterally. Normal anterior communicating complex. Both ACAs patent to their distal aspects without significant stenosis. No M1 stenosis or occlusion. No proximal MCA branch occlusion or high-grade stenosis. Distal MCA branches perfused and symmetric, although demonstrates small vessel atheromatous irregularity. Posterior circulation: Left V4 segment dominant and patent without stenosis. Focal severe stenosis involving the distal right V4 segment just prior to the vertebrobasilar junction (series 1057, image 9). Both PICA patent. Basilar patent without stenosis. Superior cerebral arteries patent bilaterally. Right PCA supplied via the basilar. Predominant fetal type origin of the left PCA. Focal moderate to severe right P2 stenosis (series 1057, image 14). Additional moderate to severe distal left P3 stenosis (series 1057, image 14). PCAs remain patent to their distal aspects. Anatomic variants: As above. IMPRESSION: MRI HEAD IMPRESSION: 1. No acute intracranial abnormality. 2. Mild chronic microvascular ischemic disease for age. MRA HEAD IMPRESSION: 1. Negative intracranial MRA for large vessel occlusion. 2. Intracranial atherosclerotic disease, with most notable findings including moderate to severe stenoses at the distal right V4 segment, right P2 segment, and distal left P3 segment. 3. 2 mm outpouching extending medially from the cavernous right ICA suspicious for a small aneurysm. Electronically Signed   By: Rise Mu M.D.   On: 08/24/2022 04:29   MR ANGIO HEAD WO CONTRAST  Result Date: 08/24/2022 CLINICAL DATA:  Initial evaluation for TIA. EXAM: MRI HEAD WITHOUT CONTRAST MRA HEAD WITHOUT CONTRAST TECHNIQUE: Multiplanar, multi-echo pulse sequences of the brain and surrounding structures were acquired without intravenous contrast. Angiographic images of the Circle of Willis were acquired using MRA technique without intravenous contrast. COMPARISON:  Prior CT from 08/23/2022. FINDINGS: MRI  HEAD FINDINGS Brain: Cerebral volume within normal limits for age. T2/FLAIR hyperintensity involving the periventricular deep white matter both cerebral hemispheres, most like related chronic microvascular ischemic disease, overall mild in nature and less than is typically seen for age. No evidence for acute or subacute infarct. Gray-white matter differentiation maintained. No areas of chronic cortical infarction. No acute or chronic intracranial blood products. No mass lesion, midline shift or mass effect. No hydrocephalus or extra-axial fluid collection. Pituitary gland and suprasellar region within normal limits. Vascular: Major intracranial vascular flow voids are maintained. Skull and upper cervical spine: Craniocervical junction within normal limits. Bone marrow signal intensity normal. No scalp soft tissue abnormality. Sinuses/Orbits: Patient status post bilateral ocular lens replacement. Paranasal sinuses are largely clear. Trace right mastoid effusion, of doubtful significance. Other: None. MRA HEAD FINDINGS Anterior circulation: Visualized distal cervical segments of the internal carotid arteries are patent with antegrade flow. Atheromatous irregularity throughout the carotid siphons without hemodynamically significant stenosis. Tiny 2 mm outpouching extending medially from the cavernous right ICA suspicious for a small aneurysm (series 9, image  72). A1 segments patent bilaterally. Normal anterior communicating complex. Both ACAs patent to their distal aspects without significant stenosis. No M1 stenosis or occlusion. No proximal MCA branch occlusion or high-grade stenosis. Distal MCA branches perfused and symmetric, although demonstrates small vessel atheromatous irregularity. Posterior circulation: Left V4 segment dominant and patent without stenosis. Focal severe stenosis involving the distal right V4 segment just prior to the vertebrobasilar junction (series 1057, image 9). Both PICA patent. Basilar  patent without stenosis. Superior cerebral arteries patent bilaterally. Right PCA supplied via the basilar. Predominant fetal type origin of the left PCA. Focal moderate to severe right P2 stenosis (series 1057, image 14). Additional moderate to severe distal left P3 stenosis (series 1057, image 14). PCAs remain patent to their distal aspects. Anatomic variants: As above. IMPRESSION: MRI HEAD IMPRESSION: 1. No acute intracranial abnormality. 2. Mild chronic microvascular ischemic disease for age. MRA HEAD IMPRESSION: 1. Negative intracranial MRA for large vessel occlusion. 2. Intracranial atherosclerotic disease, with most notable findings including moderate to severe stenoses at the distal right V4 segment, right P2 segment, and distal left P3 segment. 3. 2 mm outpouching extending medially from the cavernous right ICA suspicious for a small aneurysm. Electronically Signed   By: Rise Mu M.D.   On: 08/24/2022 04:29   CT HEAD WO CONTRAST  Result Date: 08/23/2022 CLINICAL DATA:  Neuro deficit EXAM: CT HEAD WITHOUT CONTRAST TECHNIQUE: Contiguous axial images were obtained from the base of the skull through the vertex without intravenous contrast. RADIATION DOSE REDUCTION: This exam was performed according to the departmental dose-optimization program which includes automated exposure control, adjustment of the mA and/or kV according to patient size and/or use of iterative reconstruction technique. COMPARISON:  None Available. FINDINGS: Brain: No evidence of acute infarction, hemorrhage, hydrocephalus, extra-axial collection or mass lesion/mass effect. Periventricular and deep white matter hypodensity. Vascular: No hyperdense vessel or unexpected calcification. Skull: Normal. Negative for fracture or focal lesion. Sinuses/Orbits: No acute finding. Other: None. IMPRESSION: No acute intracranial pathology. Small-vessel white matter disease. Electronically Signed   By: Jearld Lesch M.D.   On: 08/23/2022  13:25     LOS: 0 days   Jeoffrey Massed, MD  [facility]    To contact the attending provider between 7A-7P or the covering provider during after hours 7P-7A, please log into the web site [email or web address] and access using universal [place] password for that web site. If you do not have the password, please call the hospital operator.  08/24/2022, 10:21 AM

## 2022-08-24 NOTE — Progress Notes (Signed)
VASCULAR LAB    Carotid duplex has been performed.  See CV proc for preliminary results.   Sheryl Towell, RVT 08/24/2022, 12:13 PM

## 2022-08-24 NOTE — Evaluation (Signed)
Occupational Therapy Evaluation  Patient Details Name: Wendy Leon MRN: [id number] DOB: 1932-07-02 Today's Date: 08/24/2022   History of Present Illness Pt is an 89 y.o. female who presented 08/23/22 with x2 day hx of altered speech and x3 day hx of diplopia. MRI negative for acute intracranial abnormality. PMH: abnormal gait, anemia, anxiety, afib, CAD, CHF, CKD stage 3, DM2, COPD, HTN, hallux valgus bil feet, OSA, syncope, TIA   Clinical Impression   PTA pt lives with her daughters (at their houses); Daughters assist with ADL/IADL as needed, as well as mobility. At baseline pt uses a rollator and a lift chair and is a very active participant in her care.  States she has a history of "falling back" into chairs. No longer complaining of visual changes and cognition appears close to baseline per daughter. Daughters able to provide level of assistance needed to facilitate a safe DC home. Educated pt/daughter on strategies to reduce risk of falls and issued a gait belt. Recommend direct assistance initially with ADL and mobility. Educated on signs/symptoms of CVA using [employer]. Pt/daughter verbalized understanding. No further OT needed.      Recommendations for follow up therapy are one component of a multi-disciplinary discharge planning process, led by the attending physician.  Recommendations may be updated based on patient status, additional functional criteria and insurance authorization.   Assistance Recommended at Discharge Frequent or constant Supervision/Assistance  Patient can return home with the following Direct supervision/assist for medications management;Direct supervision/assist for financial management;Assist for transportation;Help with stairs or ramp for entrance;A little help with bathing/dressing/bathroom;A little help with walking and/or transfers    Functional Status Assessment  Patient has not had a recent decline in their functional status  Equipment Recommendations  None  recommended by OT    Recommendations for Other Services PT consult     Precautions / Restrictions Precautions Precautions: Fall      Mobility Bed Mobility               General bed mobility comments: OOB in chair    Transfers Overall transfer level: Needs assistance Equipment used: Rolling walker (2 wheels) Transfers: Sit to/from Stand Sit to Stand: Supervision                  Balance Overall balance assessment: Needs assistance Sitting-balance support: No upper extremity supported Sitting balance-Leahy Scale: Good     Standing balance support: During functional activity Standing balance-Leahy Scale: Poor                             ADL either performed or assessed with clinical judgement   ADL Overall ADL's : At baseline                                             Vision Baseline Vision/History: 1 Wears glasses Vision Assessment?: Yes Eye Alignment: Within Functional Limits Ocular Range of Motion: Within Functional Limits Alignment/Gaze Preference: Within Defined Limits Tracking/Visual Pursuits: Able to track stimulus in all quads without difficulty Saccades: Within functional limits Convergence: Within functional limits Visual Fields: No apparent deficits Diplopia Assessment: Other (comment) (resolved; was having horizontal diplopia at all times)     Perception Perception Comments: [facility]   Praxis      Pertinent Vitals/Pain Pain Assessment Pain Assessment: No/denies pain     Hand Dominance  Right   Extremity/Trunk Assessment Upper Extremity Assessment Upper Extremity Assessment: Overall WFL for tasks assessed (complaining of pain L post shoulder baseline; RTC testing intact; ? tendonitis?)   Lower Extremity Assessment Lower Extremity Assessment: Defer to PT evaluation (BLE wekness at baseline; more d=ifficulty wtih RLE)   Cervical / Trunk Assessment Cervical / Trunk Assessment: Kyphotic   Communication  Communication Communication: No difficulties   Cognition Arousal/Alertness: Awake/alert Behavior During Therapy: WFL for tasks assessed/performed Overall Cognitive Status: History of cognitive impairments - at baseline                                 General Comments: daughter states she had cognitive changes a few days ago, but appears closer to baseline; Scored an 8 on the Short Blessed test (0-4 nomal; 5-9 impaired; pver 10 significant imipairment). most likely baseline; demosntrated ability to use her cell phone - basleine     General Comments       Exercises     Shoulder Instructions      Home Living Family/patient expects to be discharged to:: Private residence Living Arrangements: Children Available Help at Discharge: Available 24 hours/day (she goes in between houses; lives with one for 3 weeks then the other for a week) Type of Home: House Home Access: Ramped entrance     Home Layout: One level     Bathroom Shower/Tub: Producer, television/film/video: Handicapped height Bathroom Accessibility: Yes How Accessible: Accessible via walker Home Equipment: Agricultural consultant (2 wheels);Cane - single point;Rollator (4 wheels);Wheelchair - manual;Shower seat;BSC/3in1;Grab bars - tub/shower;Hand held shower head (transport chair; lift chair)          Prior Functioning/Environment Prior Level of Function : Independent/Modified Independent;Driving ("Ain;t nobody more independent"; manages medications adn finances - daughter just recently started managing meds but she is very careful about what she takes)             Mobility Comments: uses rollator ADLs Comments: able to do her ADL tasks; daughter S bathing; "they won't let me cook"; daughter states if she stands on her legs "any length of time, her legs start shaking" - at baseline        OT Problem List: Impaired balance (sitting and/or standing)      OT Treatment/Interventions:      OT  Goals(Current goals can be found in the care plan section) Acute Rehab OT Goals Patient Stated Goal: to go home today  OT Frequency:      Co-evaluation              AM-PAC OT "6 Clicks" Daily Activity     Outcome Measure Help from another person eating meals?: None Help from another person taking care of personal grooming?: A Little Help from another person toileting, which includes using toliet, bedpan, or urinal?: A Little Help from another person bathing (including washing, rinsing, drying)?: A Little Help from another person to put on and taking off regular upper body clothing?: A Little Help from another person to put on and taking off regular lower body clothing?: A Little 6 Click Score: 19   End of Session Equipment Utilized During Treatment: Gait belt;Rolling walker (2 wheels) Nurse Communication: Mobility status  Activity Tolerance: Patient tolerated treatment well Patient left: in chair;with call bell/phone within reach;with family/visitor present  OT Visit Diagnosis: Unsteadiness on feet (R26.81)  Time: 6962-9528 OT Time Calculation (min): 32 min Charges:  OT General Charges $OT Visit: 1 Visit OT Evaluation $OT Eval Moderate Complexity: 1 Mod  Elford Evilsizer, OT/L   Acute OT Clinical Specialist Acute Rehabilitation Services Pager [phone] Office [phone]   [facility] 08/24/2022, 10:35 AM

## 2022-08-24 NOTE — Consult Note (Signed)
Stroke Neurology Consultation Note  Consult Requested by: Dr. Jerral Ralph  Reason for Consult: diplopia and confusion and word finding difficulty  Consult Date: 08/24/22   The history was obtained from the pt and daughter.  During history and examination, all items were able to obtain unless otherwise noted.  History of Present Illness:  Wendy Leon is a 89 y.o. Caucasian female with PMH of HTN, DM, OSA, CKD III, CHF, COPD and stroke admitted for diplopia, confusion and word finding difficulties.   Per pt, she had chronic intermittent diplopia in the past, it happens average twice a a year, lasting 20-78min and resolved. Her PCP told her to take full dose ASA when it happens. 3 days ago, she again had diplopia, seems to have horizontal diplopia in all directions. She again took ASA a couple of times, the diplopia resolved in 1.5 days. Per daughter, family found her to have some word finding difficulty and she was frustrated on it too. It has also resolved in about 2 days. Pt also confused about her credit care information, and complained of sharp pain at bifrontal region. Currently pt said she is back to her baseline. Denies any N/V, dizziness, LOC, weakness or numbness, fever or recent infection. Her BP on presentation to ED was high and now improved. She did not check her BP at home.   LSN: 3 days ago tPA Given: No: outside window, now symptoms resolved, MRI no stroke  Past Medical History:  Diagnosis Date   Abnormal gait 06/07/2009   Last Assessment & Plan:  She saw specialist who has given her orders for PT and she is wanting to have it setup for her at [facility] where she already is receiving care from at home   Acid reflux 02/20/2014   Aftercare following surgery 11/27/2015   Anemia of unknown etiology 10/18/2016   Anxiety 02/20/2014   Atrial fibrillation (HCC)    CAD (coronary artery disease)    Cerebral ischemia 03/11/2016   Overview:  Microvascular ischemia seen on MRI 01/2015 with  partial empty sella turcica neurology appt 03/12/2016 pending with [facility], also [facility] with PT coming to the house for her gait abnormality and lumbar DDD, as well as local ortho (durranni)    Chest pain 11/03/2018   CHF (congestive heart failure) (HCC) 06/24/2015   Last Assessment & Plan:  Relevant Hx: Course: Daily Update: Today's Plan:she has been doing well overall from her heart and no SOB  Electronically signed by: Krystal Clark, NP 06/26/15 1522   Chronic diastolic heart failure (HCC) 06/24/2015   Last Assessment & Plan:  Relevant Hx: Course: Daily Update: Today's Plan:she has been doing well overall from her heart and no SOB  Electronically signed by: Krystal Clark, NP 06/26/15 1522   Chronic UTI 06/27/2015   Last Assessment & Plan:  She requested a UA be checked for her as she was having more frequency than before and it has been awhile since she was on oral abx for this, she is on low dose maintenance med to prevent and has done well with that.   CKD stage 3 due to type 2 diabetes mellitus (HCC) 02/24/2018   Colon polyps    COPD (chronic obstructive pulmonary disease) (HCC) 03/09/2012   Last Assessment & Plan:  She feels she is stable from this discussed with her the allergies she has and she is going to discuss with Dr. Blenda Nicely having allergy injections as well for this  Diabetes (HCC) 02/20/2014   Dizziness 02/07/2016   Overview:  Chronic and has been having falls, MRI showing chronic microvascular ischemia, as well as partial sella turcica pending neuro appt for her with [facility]   Dyslipidemia 07/29/2015   Edema 06/24/2015   Last Assessment & Plan:  Relevant Hx: Course: Daily Update: Today's Plan:this is stable for her overall and will follow for her  Electronically signed by: Krystal Clark, NP 06/27/15 0813   Elevated cholesterol    Essential hypertension 03/09/2012   Last Assessment & Plan:  On repeat this was stable for her and she does not  check it at home much but when has been checked has been fine for her   Fistula 02/20/2014   Gastroesophageal reflux disease without esophagitis 02/20/2014   Last Assessment & Plan:  Relevant Hx: Course: Daily Update: Today's Plan:not on meds for this as she has had issues with her arms cramping and she is going to just drink buttermilk  Electronically signed by: Krystal Clark, NP 06/26/15 1530   Generalized abdominal pain 10/24/2016   Hallux valgus of left foot 12/04/2015   Hallux valgus of right foot 12/04/2015   High risk medication use 06/24/2015   History of recurrent UTIs 06/15/2019   Hx of valvular heart disease 01/29/2016   Hypertension 02/20/2014   Hypertensive heart disease with heart failure (HCC) 03/09/2012   Last Assessment & Plan:  On repeat this was stable for her and she does not check it at home much but when has been checked has been fine for her   Hypothyroidism (acquired) 03/09/2012   Last Assessment & Plan:  She was due for this to be updated and have advised her to get this drawn while here today   IBS (irritable bowel syndrome) 06/24/2015   Last Assessment & Plan:  Relevant Hx: Course: Daily Update: Today's Plan:this is stable for her  Electronically signed by: Krystal Clark, NP 06/26/15 1531   IBS (irritable bowel syndrome)    Idiopathic progressive polyneuropathy 06/07/2009   Left bundle branch block 06/24/2015   Leukocytosis 06/24/2015   Last Assessment & Plan:  Relevant Hx: Course: Daily Update: Today's Plan:this has been stable for her with FU through hematology who felt she was having benign swings in this and she chose to not have bone marrow biopsy done   Electronically signed by: Krystal Clark, NP 06/27/15 [phone]   Liposarcoma, 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 Sharion Dove [facility]), 8 x 6 x 3 cm. Local recurrence, re-excision 03/23/2007 Thedore Mins), 5 x 3 x 2 cm. Surveillance  plan: baseline MRI April 2014  Last Assessment & Plan:  Relevant Hx: Course: Daily Update: Today's Plan:she is stable from this  Electronically signed by: Krystal Clark, NP 06/26/15 1533   Lumbar degenerative disc disease 11/06/2015   Last Assessment & Plan:  At this point she needs updated MRI of her lumbar spine and we discussed with her gait abnormality she is likely going to have to see specialist at [facility] for this, she has seen local neuro for this several years ago in terms of her neuropathy and her falls and he really did not address it . She was advised several years ago to have back surgery locally which we did not feel was ideal with her sugars at the time being so out of control and now she is really more in control than then and it may be doable for  her if felt  Needed though she would have to have cardiology clear her as well.   Mild CAD 07/29/2015   Overview:  Overview:  Cath march 2016 with 60% LAD with normal FFR   Overview:  Cath march 2016 with 60% LAD with normal FFR    Mixed hyperlipidemia 06/24/2015   Last Assessment & Plan:  Relevant Hx: Course: Daily Update: Today's Plan:update her lipids for her today and she is trying to watch her diet   Electronically signed by: Krystal Clark, NP 06/27/15 0813   Obstructive sleep apnea syndrome 06/24/2015   Onychomycosis due to dermatophyte 11/27/2015   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   Last Assessment & Plan:  Relevant Hx: Course: Daily Update: Today's Plan:she is doing well overall  Electronically signed by: Krystal Clark, NP 06/26/15 1531   Pancreatitis    Pre-ulcerative calluses 12/04/2015   Pre-ulcerative corn or callous 11/27/2015   Primary osteoarthritis involving multiple joints 07/06/2015   Last Assessment & Plan:  Relevant Hx: Course: Daily Update: Today's Plan:she has DDD of her back with some spinal  Stenosis that she has seen ortho locally for and he wanted to do surgery which she has avoided and I agree  with, she is going to use the flexeril for this right now as she is having more muscle spasm than what she has had in the past from her attempt to walk down some very steep steps that she was not used to . She should use her walker for support through the weekend and avoid overhead strain and lifting and stay in touch if not better.  Electronically signed by: Krystal Clark, NP 07/06/15 [phone]   Seasonal allergic rhinitis 01/12/2019   Sleep apnea    Stress incontinence of urine 04/17/2016   Syncope and collapse 10/13/2016   Thyroid disease    TIA (transient ischemic attack) 10/13/2016   Type 2 diabetes mellitus without complication, without long-term current use of insulin (HCC) 03/09/2012   Last Assessment & Plan:  She brings in readings of her sugars which show she has been doing much better overall with this at home and quite pleased with her   UTI (urinary tract infection)    Vascular calcification 01/29/2016   Visual changes 01/29/2016   Overview:  Following with Dr. Marina Gravel as well as had recent carotid doppler showing minimal plaque present and vertebral arteries normal , nothing signficant   Vitamin B12 deficiency 12/04/2015    Past Surgical History:  Procedure Laterality Date   ABDOMINAL HYSTERECTOMY     CHOLECYSTECTOMY     EYE SURGERY     PANCREAS SURGERY     skin cancer lesion      Family History  Problem Relation Age of Onset   Diabetes Mother    Hypertension Mother    Cancer Sister    Hypertension Father     Social History:  reports that she has never smoked. She has been exposed to tobacco smoke. Her smokeless tobacco use includes snuff. She reports that she does not drink alcohol and does not use drugs.  Allergies:  Allergies  Allergen Reactions   Doxycycline Nausea And Vomiting and Other (See Comments)    GI Intolerance   Nitrofurantoin Nausea And Vomiting    Cramps, GI Upset, Fever, weakness, nausea    Statins Other (See Comments)     pancreatitis    Sulfamethoxazole Rash   Trimethoprim Rash    No current facility-administered medications on file prior to encounter.  Current Outpatient Medications on File Prior to Encounter  Medication Sig Dispense Refill   albuterol (PROVENTIL) (2.5 MG/3ML) 0.083% nebulizer solution Take 2.5 mg by nebulization every 4 (four) hours as needed for wheezing or shortness of breath.     albuterol (VENTOLIN HFA) 108 (90 Base) MCG/ACT inhaler Inhale 2 puffs into the lungs every 4 (four) hours as needed for wheezing or shortness of breath.     Alirocumab (PRALUENT) 150 MG/ML SOAJ Inject 1 mL into the skin every 14 (fourteen) days. 6 mL 3   ALPRAZolam (XANAX) 0.5 MG tablet Take 0.5 mg by mouth 3 (three) times daily as needed for anxiety.      amLODipine (NORVASC) 5 MG tablet Take 2.5 mg by mouth daily.      aspirin EC 81 MG tablet Take 81 mg by mouth daily.     benazepril (LOTENSIN) 20 MG tablet Take 20 mg by mouth daily.  5   celecoxib (CELEBREX) 200 MG capsule Take 200 mg by mouth daily as needed for mild pain or moderate pain.     cephALEXin (KEFLEX) 250 MG capsule Take 250 mg by mouth daily.     cyanocobalamin (,VITAMIN B-12,) 1000 MCG/ML injection Inject into the muscle every 30 (thirty) days.     donepezil (ARICEPT) 5 MG tablet Take 5 mg by mouth at bedtime.  5   fluticasone (FLONASE) 50 MCG/ACT nasal spray Place 1 spray into the nose 2 (two) times daily.     furosemide (LASIX) 40 MG tablet Take 1 tablet (40 mg total) by mouth 2 (two) times daily. 180 tablet 3   gabapentin (NEURONTIN) 300 MG capsule Take 300 mg by mouth daily.  11   glimepiride (AMARYL) 1 MG tablet Take 0.5 tablets by mouth in the morning and at bedtime.   4   insulin glargine (LANTUS) 100 UNIT/ML Solostar Pen Inject 10-40 Units into the skin at bedtime. Sliding scale     isosorbide mononitrate (IMDUR) 30 MG 24 hr tablet Take 1 tablet (30 mg total) by mouth daily. 90 tablet 3   levothyroxine (SYNTHROID, LEVOTHROID) 75 MCG tablet Take 75  mcg by mouth daily.  2   loratadine (CLARITIN) 10 MG tablet Take 10 mg by mouth daily as needed for allergies.     metFORMIN (GLUCOPHAGE-XR) 500 MG 24 hr tablet Take 500 mg by mouth 2 (two) times daily.  2   metoprolol tartrate (LOPRESSOR) 50 MG tablet Take 50 mg by mouth 2 (two) times daily.     montelukast (SINGULAIR) 10 MG tablet Take 10 mg by mouth at bedtime.  3   NITRO-BID 2 % ointment Apply 1 inch topically at bedtime. 30 g 6   nitroGLYCERIN (NITROSTAT) 0.4 MG SL tablet DISSOLVE 1 TABLET UNDER THE  TONGUE EVERY 5 MINUTES AS NEEDED FOR CHEST PAIN. MAX OF 3 TABLETS IN 15 MINUTES. CALL 911 IF PAIN  PERSISTS. (Patient taking differently: Place 0.4 mg under the tongue every 5 (five) minutes as needed for chest pain.) 50 tablet 7   nystatin (MYCOSTATIN/NYSTOP) powder Apply 1 Application topically 2 (two) times daily.     nystatin cream (MYCOSTATIN) Apply 1 Application topically 2 (two) times daily.     SYMBICORT 160-4.5 MCG/ACT inhaler Inhale 2 puffs into the lungs 2 (two) times daily.  2   traMADol (ULTRAM) 50 MG tablet Take 50 mg by mouth every 6 (six) hours as needed for moderate pain.     Blood Glucose Monitoring Suppl (ACCU-CHEK AVIVA PLUS) w/Device KIT See admin  instructions.  0    Review of Systems: A full ROS was attempted today and was able to be performed.  Systems assessed include - Constitutional, Eyes, HENT, Respiratory, Cardiovascular, Gastrointestinal, Genitourinary, Integument/breast, Hematologic/lymphatic, Musculoskeletal, Neurological, Behavioral/Psych, Endocrine, Allergic/Immunologic - with pertinent responses as per HPI.  Physical Examination: Temp:  [97.8 F (36.6 C)-98.4 F (36.9 C)] 98.3 F (36.8 C) (04/28 0800) Pulse Rate:  [56-69] 63 (04/28 1300) Resp:  [15-20] 15 (04/28 1300) BP: (129-160)/(46-84) 132/46 (04/28 0441) SpO2:  [93 %-97 %] 97 % (04/28 1300) FiO2 (%):  [0 %] 0 % (04/27 2335)  General - well nourished, well developed, in no apparent distress.     Ophthalmologic - fundi not visualized due to noncooperation.    Cardiovascular - regular rhythm and rate  Mental Status -  Level of arousal and orientation to time, place, and person were intact. Language including expression, naming, repetition, comprehension was assessed and found intact. Fund of Knowledge was assessed and was intact.  Cranial Nerves II - XII - II - Vision intact OU. III, IV, VI - Extraocular movements intact. V - Facial sensation intact bilaterally. VII - Facial movement intact bilaterally. VIII - Hearing & vestibular intact bilaterally. X - Palate elevates symmetrically. XI - Chin turning & shoulder shrug intact bilaterally. XII - Tongue protrusion intact.  Motor Strength - The patient's strength was normal in all extremities and pronator drift was absent.   Motor Tone & Bulk - Muscle tone was assessed at the neck and appendages and was normal.  Bulk was normal and fasciculations were absent.   Reflexes - The patient's reflexes were normal in all extremities and she had no pathological reflexes.  Sensory - Light touch, temperature/pinprick were assessed and were normal.    Coordination - The patient had normal movements in the hands with no ataxia or dysmetria.  Tremor was absent.  Gait and Station - deferred  Data Reviewed: VAS US CAROTID  Result Date: 08/24/2022 Carotid Arterial Duplex Study Patient Name:  Wendy Leon  Date of Exam:   08/24/2022 Medical Rec #: [id number]     Accession #:    [id number] Date of Birth: 1933/02/12      Patient Gender: F Patient Age:   11 years Exam Location:  [facility] Procedure:      VAS US CAROTID Referring Phys: Scheryl Marten Simeon Vera --------------------------------------------------------------------------------  Indications:       TIA, Speech disturbance and Visual disturbance. Risk Factors:      Hypertension, Diabetes, prior CVA. Other Factors:     Heart failure, OSA, COPD, CKD III. Comparison Study:  No prior study  Performing Technologist: Sherren Kerns RVS  Examination Guidelines: A complete evaluation includes B-mode imaging, spectral Doppler, color Doppler, and power Doppler as needed of all accessible portions of each vessel. Bilateral testing is considered an integral part of a complete examination. Limited examinations for reoccurring indications may be performed as noted.  Right Carotid Findings: +----------+--------+--------+--------+------------------+------------------+           PSV cm/sEDV cm/sStenosisPlaque DescriptionComments           +----------+--------+--------+--------+------------------+------------------+ CCA Prox  90      16                                intimal thickening +----------+--------+--------+--------+------------------+------------------+ CCA Distal61      15  intimal thickening +----------+--------+--------+--------+------------------+------------------+ ICA Prox  55      14              heterogenous                         +----------+--------+--------+--------+------------------+------------------+ ICA Mid   87      20                                                   +----------+--------+--------+--------+------------------+------------------+ ICA Distal87      19                                                   +----------+--------+--------+--------+------------------+------------------+ ECA       81      11                                                   +----------+--------+--------+--------+------------------+------------------+ +----------+--------+-------+--------+-------------------+           PSV cm/sEDV cmsDescribeArm Pressure (mmHG) +----------+--------+-------+--------+-------------------+ [id number]                                         +----------+--------+-------+--------+-------------------+ +---------+--------+--+--------+--+ VertebralPSV cm/s46EDV cm/s11  +---------+--------+--+--------+--+  Left Carotid Findings: +----------+--------+--------+--------+------------------+------------------+           PSV cm/sEDV cm/sStenosisPlaque DescriptionComments           +----------+--------+--------+--------+------------------+------------------+ CCA Prox  66      8                                 intimal thickening +----------+--------+--------+--------+------------------+------------------+ CCA Distal72      13                                intimal thickening +----------+--------+--------+--------+------------------+------------------+ ICA Prox  93      23              heterogenous                         +----------+--------+--------+--------+------------------+------------------+ ICA Mid   92      19                                                   +----------+--------+--------+--------+------------------+------------------+ ICA Distal101     22                                                   +----------+--------+--------+--------+------------------+------------------+ ECA       44  5                                                    +----------+--------+--------+--------+------------------+------------------+ +----------+--------+--------+--------+-------------------+           PSV cm/sEDV cm/sDescribeArm Pressure (mmHG) +----------+--------+--------+--------+-------------------+ [id number]                                          +----------+--------+--------+--------+-------------------+ +---------+--------+--+--------+-+ VertebralPSV cm/s28EDV cm/s5 +---------+--------+--+--------+-+   Summary: Right Carotid: The extracranial vessels were near-normal with only minimal wall                thickening or plaque. Left Carotid: The extracranial vessels were near-normal with only minimal wall               thickening or plaque. Vertebrals:  Bilateral vertebral arteries demonstrate antegrade flow.  Subclavians: Normal flow hemodynamics were seen in bilateral subclavian              arteries. *See table(s) above for measurements and observations.     Preliminary    DG CHEST PORT 1 VIEW  Result Date: 08/24/2022 CLINICAL DATA:  Leukocytosis. EXAM: PORTABLE CHEST 1 VIEW COMPARISON:  Chest radiograph 02/08/2020, chest CT 05/18/2017 FINDINGS: Stable biapical pleuroparenchymal scarring. Additional linear scarring in the right upper lobe. No acute airspace disease. The heart is normal in size. Stable mediastinal contours. Aortic atherosclerosis. No pulmonary edema, pleural effusion or pneumothorax. On limited assessment, no acute osseous findings. IMPRESSION: 1. No acute abnormality. 2. Stable biapical pleuroparenchymal scarring. Electronically Signed   By: Narda Rutherford M.D.   On: 08/24/2022 12:25   MR BRAIN WO CONTRAST  Result Date: 08/24/2022 CLINICAL DATA:  Initial evaluation for TIA. EXAM: MRI HEAD WITHOUT CONTRAST MRA HEAD WITHOUT CONTRAST TECHNIQUE: Multiplanar, multi-echo pulse sequences of the brain and surrounding structures were acquired without intravenous contrast. Angiographic images of the Circle of Willis were acquired using MRA technique without intravenous contrast. COMPARISON:  Prior CT from 08/23/2022. FINDINGS: MRI HEAD FINDINGS Brain: Cerebral volume within normal limits for age. T2/FLAIR hyperintensity involving the periventricular deep white matter both cerebral hemispheres, most like related chronic microvascular ischemic disease, overall mild in nature and less than is typically seen for age. No evidence for acute or subacute infarct. Gray-white matter differentiation maintained. No areas of chronic cortical infarction. No acute or chronic intracranial blood products. No mass lesion, midline shift or mass effect. No hydrocephalus or extra-axial fluid collection. Pituitary gland and suprasellar region within normal limits. Vascular: Major intracranial vascular flow voids are  maintained. Skull and upper cervical spine: Craniocervical junction within normal limits. Bone marrow signal intensity normal. No scalp soft tissue abnormality. Sinuses/Orbits: Patient status post bilateral ocular lens replacement. Paranasal sinuses are largely clear. Trace right mastoid effusion, of doubtful significance. Other: None. MRA HEAD FINDINGS Anterior circulation: Visualized distal cervical segments of the internal carotid arteries are patent with antegrade flow. Atheromatous irregularity throughout the carotid siphons without hemodynamically significant stenosis. Tiny 2 mm outpouching extending medially from the cavernous right ICA suspicious for a small aneurysm (series 9, image 72). A1 segments patent bilaterally. Normal anterior communicating complex. Both ACAs patent to their distal aspects without significant stenosis. No M1 stenosis or occlusion. No proximal MCA branch occlusion or high-grade stenosis. Distal  MCA branches perfused and symmetric, although demonstrates small vessel atheromatous irregularity. Posterior circulation: Left V4 segment dominant and patent without stenosis. Focal severe stenosis involving the distal right V4 segment just prior to the vertebrobasilar junction (series 1057, image 9). Both PICA patent. Basilar patent without stenosis. Superior cerebral arteries patent bilaterally. Right PCA supplied via the basilar. Predominant fetal type origin of the left PCA. Focal moderate to severe right P2 stenosis (series 1057, image 14). Additional moderate to severe distal left P3 stenosis (series 1057, image 14). PCAs remain patent to their distal aspects. Anatomic variants: As above. IMPRESSION: MRI HEAD IMPRESSION: 1. No acute intracranial abnormality. 2. Mild chronic microvascular ischemic disease for age. MRA HEAD IMPRESSION: 1. Negative intracranial MRA for large vessel occlusion. 2. Intracranial atherosclerotic disease, with most notable findings including moderate to severe  stenoses at the distal right V4 segment, right P2 segment, and distal left P3 segment. 3. 2 mm outpouching extending medially from the cavernous right ICA suspicious for a small aneurysm. Electronically Signed   By: Rise Mu M.D.   On: 08/24/2022 04:29   MR ANGIO HEAD WO CONTRAST  Result Date: 08/24/2022 CLINICAL DATA:  Initial evaluation for TIA. EXAM: MRI HEAD WITHOUT CONTRAST MRA HEAD WITHOUT CONTRAST TECHNIQUE: Multiplanar, multi-echo pulse sequences of the brain and surrounding structures were acquired without intravenous contrast. Angiographic images of the Circle of Willis were acquired using MRA technique without intravenous contrast. COMPARISON:  Prior CT from 08/23/2022. FINDINGS: MRI HEAD FINDINGS Brain: Cerebral volume within normal limits for age. T2/FLAIR hyperintensity involving the periventricular deep white matter both cerebral hemispheres, most like related chronic microvascular ischemic disease, overall mild in nature and less than is typically seen for age. No evidence for acute or subacute infarct. Gray-white matter differentiation maintained. No areas of chronic cortical infarction. No acute or chronic intracranial blood products. No mass lesion, midline shift or mass effect. No hydrocephalus or extra-axial fluid collection. Pituitary gland and suprasellar region within normal limits. Vascular: Major intracranial vascular flow voids are maintained. Skull and upper cervical spine: Craniocervical junction within normal limits. Bone marrow signal intensity normal. No scalp soft tissue abnormality. Sinuses/Orbits: Patient status post bilateral ocular lens replacement. Paranasal sinuses are largely clear. Trace right mastoid effusion, of doubtful significance. Other: None. MRA HEAD FINDINGS Anterior circulation: Visualized distal cervical segments of the internal carotid arteries are patent with antegrade flow. Atheromatous irregularity throughout the carotid siphons without  hemodynamically significant stenosis. Tiny 2 mm outpouching extending medially from the cavernous right ICA suspicious for a small aneurysm (series 9, image 72). A1 segments patent bilaterally. Normal anterior communicating complex. Both ACAs patent to their distal aspects without significant stenosis. No M1 stenosis or occlusion. No proximal MCA branch occlusion or high-grade stenosis. Distal MCA branches perfused and symmetric, although demonstrates small vessel atheromatous irregularity. Posterior circulation: Left V4 segment dominant and patent without stenosis. Focal severe stenosis involving the distal right V4 segment just prior to the vertebrobasilar junction (series 1057, image 9). Both PICA patent. Basilar patent without stenosis. Superior cerebral arteries patent bilaterally. Right PCA supplied via the basilar. Predominant fetal type origin of the left PCA. Focal moderate to severe right P2 stenosis (series 1057, image 14). Additional moderate to severe distal left P3 stenosis (series 1057, image 14). PCAs remain patent to their distal aspects. Anatomic variants: As above. IMPRESSION: MRI HEAD IMPRESSION: 1. No acute intracranial abnormality. 2. Mild chronic microvascular ischemic disease for age. MRA HEAD IMPRESSION: 1. Negative intracranial MRA for large vessel occlusion. 2. Intracranial  atherosclerotic disease, with most notable findings including moderate to severe stenoses at the distal right V4 segment, right P2 segment, and distal left P3 segment. 3. 2 mm outpouching extending medially from the cavernous right ICA suspicious for a small aneurysm. Electronically Signed   By: Rise Mu M.D.   On: 08/24/2022 04:29   CT HEAD WO CONTRAST  Result Date: 08/23/2022 CLINICAL DATA:  Neuro deficit EXAM: CT HEAD WITHOUT CONTRAST TECHNIQUE: Contiguous axial images were obtained from the base of the skull through the vertex without intravenous contrast. RADIATION DOSE REDUCTION: This exam was  performed according to the departmental dose-optimization program which includes automated exposure control, adjustment of the mA and/or kV according to patient size and/or use of iterative reconstruction technique. COMPARISON:  None Available. FINDINGS: Brain: No evidence of acute infarction, hemorrhage, hydrocephalus, extra-axial collection or mass lesion/mass effect. Periventricular and deep white matter hypodensity. Vascular: No hyperdense vessel or unexpected calcification. Skull: Normal. Negative for fracture or focal lesion. Sinuses/Orbits: No acute finding. Other: None. IMPRESSION: No acute intracranial pathology. Small-vessel white matter disease. Electronically Signed   By: Jearld Lesch M.D.   On: 08/23/2022 13:25    Assessment: 89 y.o. female with PMH of HTN, DM, OSA, CKD III, CHF, COPD and stroke admitted for diplopia, confusion and word finding difficulties. Symptoms has resolved now. Per pt, she does have intermittent diplopia for a long time, was told to take ASA when it happens. CT no acute finding. MRI no acute infarct. MRA showed moderate to severe R V4, P2 and left P3 stenosis. Echo pending. CUS unremarkable. LDL 107, A1C 6.7 and UDS neg. Metabolic work up including ammonia, CXR, UA negative but she did have elevated WBC but seems to be chronic. Her BP was high on presentation and now normalized.  Etiology for pt symptoms not quite clear, concerning for encephalopathy, such as hypertensive encephalopathy or metabolic encephalopathy. Pt also has at least MCI and on aricept at home, which may also contribute to her symptoms. Recommend further encephalopathy management. Continue home ASA and praluent. Will need outpt follow up with neurology.   Stroke Risk Factors - diabetes mellitus, hyperlipidemia, hypertension, and OSA and CHF  Plan: Continue further encephalopathy management per primary team Close BP monitoring at home and follow up with PCP closely Continue home ASA and  praluent Will refer to outpt neurology for intermittent diplopia and MCI Discussed with Dr. Jerral Ralph Neurology will sign off for now. Please call with questions. Pt will follow up at [facility] in about 4 weeks. Thanks for the consult.   Thank you for this consultation and allowing [place] to participate in the care of this patient.  Marvel Plan, MD PhD Stroke Neurology 08/24/2022 4:46 PM

## 2022-08-24 NOTE — Evaluation (Signed)
Speech Language Pathology Evaluation Patient Details Name: DICIE EDELEN MRN: [id number] DOB: [birth year] Today's Date: 08/24/2022 Time: 1510-1530 SLP Time Calculation (min) (ACUTE ONLY): 20 min  Problem List:  Patient Active Problem List   Diagnosis Date Noted   Hyponatremia 08/23/2022   Thyroid disease    Sleep apnea    Pancreatitis    Elevated cholesterol    Colon polyps    CAD (coronary artery disease)    Atrial fibrillation (HCC)    History of recurrent UTIs 06/15/2019   Seasonal allergic rhinitis 01/12/2019   CKD stage 3a, GFR 45-59 ml/min (HCC) 02/24/2018   Generalized abdominal pain 10/24/2016   Anemia of unknown etiology 10/18/2016   TIA (transient ischemic attack) 10/13/2016   Stress incontinence of urine 04/17/2016   Cerebral ischemia 03/11/2016   Dizziness 02/07/2016   Hx of valvular heart disease 01/29/2016   Vascular calcification 01/29/2016   Visual changes 01/29/2016   Hallux valgus of left foot 12/04/2015   Hallux valgus of right foot 12/04/2015   Pre-ulcerative calluses 12/04/2015   Vitamin B12 deficiency 12/04/2015   Aftercare following surgery 11/27/2015   Onychomycosis due to dermatophyte 11/27/2015   Pre-ulcerative corn or callous 11/27/2015   Lumbar degenerative disc disease 11/06/2015   Dyslipidemia 07/29/2015   Mild CAD 07/29/2015   Primary osteoarthritis involving multiple joints 07/06/2015   Chronic UTI 06/27/2015   Chronic diastolic heart failure (HCC) 06/24/2015   Edema 06/24/2015   High risk medication use 06/24/2015   IBS (irritable bowel syndrome) 06/24/2015   Left bundle branch block 06/24/2015   Mixed hyperlipidemia 06/24/2015   Obstructive sleep apnea syndrome 06/24/2015   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   CHF (congestive heart failure) (HCC) 06/24/2015   Acid reflux 02/20/2014   Anxiety 02/20/2014   Diabetes (HCC) 02/20/2014   Gastroesophageal reflux disease without esophagitis 02/20/2014   Fistula 02/20/2014   Hypertension  02/20/2014   COPD (chronic obstructive pulmonary disease) (HCC) 03/09/2012   Hypertensive heart disease with heart failure (HCC) 03/09/2012   Hypothyroidism (acquired) 03/09/2012   Liposarcoma, 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   Type 2 diabetes mellitus without complication, without long-term current use of insulin (HCC) 03/09/2012   Essential hypertension 03/09/2012   Abnormal gait 06/07/2009   Idiopathic progressive polyneuropathy 06/07/2009   Past Medical History:  Past Medical History:  Diagnosis Date   Abnormal gait 06/07/2009   Last Assessment & Plan:  She saw specialist who has given her orders for PT and she is wanting to have it setup for her at [facility] where she already is receiving care from at home   Acid reflux 02/20/2014   Aftercare following surgery 11/27/2015   Anemia of unknown etiology 10/18/2016   Anxiety 02/20/2014   Atrial fibrillation (HCC)    CAD (coronary artery disease)    Cerebral ischemia 03/11/2016   Overview:  Microvascular ischemia seen on MRI 01/2015 with partial empty sella turcica neurology appt 03/12/2016 pending with [facility], also [facility] with [facility] coming to the house for her gait abnormality and lumbar DDD, as well as local ortho (durranni)    Chest pain 11/03/2018   CHF (congestive heart failure) (HCC) 06/24/2015   Last Assessment & Plan:  Relevant Hx: Course: Daily Update: Today's Plan:she has been doing well overall from her heart and no SOB  Electronically signed by: Krystal Clark, NP 06/26/15 1522   Chronic diastolic heart failure (HCC) 06/24/2015   Last Assessment & Plan:  Relevant Hx: Course: Daily Update: Today's Plan:she has  been doing well overall from her heart and no SOB  Electronically signed by: Krystal Clark, NP 06/26/15 1522   Chronic UTI 06/27/2015   Last Assessment & Plan:  She requested a UA be checked for her as she was having more frequency than before and it has been awhile since she was on oral  abx for this, she is on low dose maintenance med to prevent and has done well with that.   CKD stage 3 due to type 2 diabetes mellitus (HCC) 02/24/2018   Colon polyps    COPD (chronic obstructive pulmonary disease) (HCC) 03/09/2012   Last Assessment & Plan:  She feels she is stable from this discussed with her the allergies she has and she is going to discuss with Dr. Blenda Nicely having allergy injections as well for this    Diabetes (HCC) 02/20/2014   Dizziness 02/07/2016   Overview:  Chronic and has been having falls, MRI showing chronic microvascular ischemia, as well as partial sella turcica pending neuro appt for her with [facility]   Dyslipidemia 07/29/2015   Edema 06/24/2015   Last Assessment & Plan:  Relevant Hx: Course: Daily Update: Today's Plan:this is stable for her overall and will follow for her  Electronically signed by: Krystal Clark, NP 06/27/15 0813   Elevated cholesterol    Essential hypertension 03/09/2012   Last Assessment & Plan:  On repeat this was stable for her and she does not check it at home much but when has been checked has been fine for her   Fistula 02/20/2014   Gastroesophageal reflux disease without esophagitis 02/20/2014   Last Assessment & Plan:  Relevant Hx: Course: Daily Update: Today's Plan:not on meds for this as she has had issues with her arms cramping and she is going to just drink buttermilk  Electronically signed by: Krystal Clark, NP 06/26/15 1530   Generalized abdominal pain 10/24/2016   Hallux valgus of left foot 12/04/2015   Hallux valgus of right foot 12/04/2015   High risk medication use 06/24/2015   History of recurrent UTIs 06/15/2019   Hx of valvular heart disease 01/29/2016   Hypertension 02/20/2014   Hypertensive heart disease with heart failure (HCC) 03/09/2012   Last Assessment & Plan:  On repeat this was stable for her and she does not check it at home much but when has been checked has been fine for her   Hypothyroidism  (acquired) 03/09/2012   Last Assessment & Plan:  She was due for this to be updated and have advised her to get this drawn while here today   IBS (irritable bowel syndrome) 06/24/2015   Last Assessment & Plan:  Relevant Hx: Course: Daily Update: Today's Plan:this is stable for her  Electronically signed by: Krystal Clark, NP 06/26/15 1531   IBS (irritable bowel syndrome)    Idiopathic progressive polyneuropathy 06/07/2009   Left bundle branch block 06/24/2015   Leukocytosis 06/24/2015   Last Assessment & Plan:  Relevant Hx: Course: Daily Update: Today's Plan:this has been stable for her with FU through hematology who felt she was having benign swings in this and she chose to not have bone marrow biopsy done   Electronically signed by: Krystal Clark, NP 06/27/15 [phone]   Liposarcoma, 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 Sharion Dove [facility]), 8 x 6 x 3 cm. Local recurrence, re-excision 03/23/2007 [facility] Mins), 5 x 3 x 2 cm. Surveillance plan: baseline MRI April 2014  Last Assessment & Plan:  Relevant Hx: Course: Daily Update: Today's Plan:she is stable from this  Electronically signed by: Krystal Clark, NP 06/26/15 1533   Lumbar degenerative disc disease 11/06/2015   Last Assessment & Plan:  At this point she needs updated MRI of her lumbar spine and we discussed with her gait abnormality she is likely going to have to see specialist at [facility] for this, she has seen local neuro for this several years ago in terms of her neuropathy and her falls and he really did not address it . She was advised several years ago to have back surgery locally which we did not feel was ideal with her sugars at the time being so out of control and now she is really more in control than then and it may be doable for her if felt  Needed though she would have to have cardiology clear her as well.   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   Last Assessment & Plan:  Relevant Hx: Course: Daily Update: Today's Plan:she is doing well overall  Electronically signed by: Krystal Clark, NP 06/26/15 1531   Pancreatitis    Pre-ulcerative calluses 12/04/2015   Pre-ulcerative corn or callous 11/27/2015   Primary osteoarthritis involving multiple joints 07/06/2015   Last Assessment & Plan:  Relevant Hx: Course: Daily Update: Today's Plan:she has DDD of her back with some spinal  Stenosis that she has seen ortho locally for and he wanted to do surgery which she has avoided and I agree with, she is going to use the flexeril for this right now as she is having more muscle spasm than what she has had in the past from her attempt to walk down some very steep steps that she was not used to . She should use her walker for support through the weekend and avoid overhead strain and lifting and stay in touch if not better.  Electronically signed by: Krystal Clark, NP 07/06/15 [phone]   Seasonal allergic rhinitis 01/12/2019   Sleep apnea    Stress incontinence of urine 04/17/2016   Syncope and collapse 10/13/2016   Thyroid disease    TIA (transient ischemic attack) 10/13/2016   Type 2 diabetes mellitus without complication, without long-term current use of insulin (HCC) 03/09/2012   Last Assessment & Plan:  She brings in readings of her sugars which show she has been doing much better overall with this at home and quite pleased with her   UTI (urinary tract  infection)    Vascular calcification 01/29/2016   Visual changes 01/29/2016   Overview:  Following with Dr. Marina Gravel as well as had recent carotid doppler showing minimal plaque present and vertebral arteries normal , nothing signficant   Vitamin B12 deficiency 12/04/2015   Past Surgical History:  Past Surgical History:  Procedure Laterality Date   ABDOMINAL HYSTERECTOMY     CHOLECYSTECTOMY     EYE SURGERY     PANCREAS SURGERY  skin cancer lesion     HPI:  Pt is an 89 y.o. female who presented 08/23/22 with x2 day hx of altered speech and x3 day hx of diplopia. MRI negative for acute intracranial abnormality. PMH: abnormal gait, anemia, anxiety, afib, CAD, CHF, CKD stage 3, DM2, COPD, HTN, hallux valgus bil feet, OSA, syncope, TIA   Assessment / Plan / Recommendation Clinical Impression  Ms. Wooding demonstrates a mild cognitive impairment, which is considered baseline for her, per daughter. Impairments are mostly in memory/attention, but impact some problem solving and calculations as well. She has full supervision and alternates living between her two daughters. They plan to continue this arrangement and provide assist with all IADL tasks such as medications, finances, driving, etc. See scores of testing below.  [facility] Mental Status Examination: Orientation: 3/3 Memory/Coding: 3/5 Calculations: 1/3 Naming: 3/3 Working Memory: 1/2 Visuospatial: 6/6 Paragraph Memory: 4/8 Total: 21/30 points, consistent with mild cognitive impairment     SLP Assessment  SLP Recommendation/Assessment: Patient does not need any further Speech Lanaguage Pathology Services SLP Visit Diagnosis: Cognitive communication deficit (R41.841)    Recommendations for follow up therapy are one component of a multi-disciplinary discharge planning process, led by the attending physician.  Recommendations may be updated based on patient status, additional functional criteria and insurance  authorization.    Follow Up Recommendations  No SLP follow up    Assistance Recommended at Discharge  Intermittent Supervision/Assistance  Functional Status Assessment Patient has not had a recent decline in their functional status     SLP Evaluation Cognition  Overall Cognitive Status: History of cognitive impairments - at baseline Arousal/Alertness: Awake/alert Orientation Level: Oriented X4 Year: 2024 Month: April Day of Week: Correct Memory: Impaired Awareness: Appears intact Problem Solving: Impaired Safety/Judgment: Appears intact       Comprehension  Auditory Comprehension Overall Auditory Comprehension: Appears within functional limits for tasks assessed    Expression Expression Primary Mode of Expression: Verbal Verbal Expression Overall Verbal Expression: Appears within functional limits for tasks assessed Written Expression Dominant Hand: Right   Oral / Motor  Oral Motor/Sensory Function Overall Oral Motor/Sensory Function: Within functional limits Motor Speech Overall Motor Speech: Appears within functional limits for tasks assessed           Basil Buffin P. Aadvika Konen, M.S., CCC-SLP Speech-Language Pathologist Acute Rehabilitation Services Pager: [phone]  Susanne Borders Schon Zeiders 08/24/2022, 3:21 PM

## 2022-08-24 NOTE — Evaluation (Signed)
Physical Therapy Evaluation & Discharge Patient Details Name: Wendy Leon MRN: [id number] DOB: 01/26/1933 Today's Date: 08/24/2022  History of Present Illness  Pt is an 89 y.o. female who presented 08/23/22 with x2 day hx of altered speech and x3 day hx of diplopia. MRI negative for acute intracranial abnormality. PMH: abnormal gait, anemia, anxiety, afib, CAD, CHF, CKD stage 3, DM2, COPD, HTN, hallux valgus bil feet, OSA, syncope, TIA   Clinical Impression  Pt presents with condition above. PTA, she was using a rollator for functional mobility, living with her children in a 1-level house with a ramped entrance. Pt displays bil lower extremity strength deficits (R>L), but reports this is close to baseline. Pt also displays deficits in balance and activity tolerance along with gait deviations, again reported to be close to baseline. Pt has good support at home. As pt is fairly close to if not at her functional baseline, all education completed and questions answered, and pt is declining follow-up PT at d/c no further acute PT needs identified. PT will sign off and defer further mobility to nursing staff and mobility techs.     Recommendations for follow up therapy are one component of a multi-disciplinary discharge planning process, led by the attending physician.  Recommendations may be updated based on patient status, additional functional criteria and insurance authorization.  Follow Up Recommendations       Assistance Recommended at Discharge Frequent or constant Supervision/Assistance  Patient can return home with the following  A little help with walking and/or transfers;A little help with bathing/dressing/bathroom;Assistance with cooking/housework;Direct supervision/assist for medications management;Direct supervision/assist for financial management;Assist for transportation;Help with stairs or ramp for entrance    Equipment Recommendations None recommended by PT (has all needed DME)   Recommendations for Other Services       Functional Status Assessment Patient has not had a recent decline in their functional status     Precautions / Restrictions Precautions Precautions: Fall Restrictions Weight Bearing Restrictions: No      Mobility  Bed Mobility Overal bed mobility: Needs Assistance Bed Mobility: Supine to Sit     Supine to sit: Min assist, HOB elevated     General bed mobility comments: MinA to manage bil legs towards R EOB and ascend trunk    Transfers Overall transfer level: Needs assistance Equipment used: Rollator (4 wheels) Transfers: Sit to/from Stand Sit to Stand: Min assist, Min guard           General transfer comment: Daughter providing minA for stability standing from EOB 1x, min guard 2nd rep    Ambulation/Gait Ambulation/Gait assistance: Min guard, Min assist Gait Distance (Feet): 180 Feet (x2 bouts of ~180 ft > ~15 ft) Assistive device: Rollator (4 wheels) Gait Pattern/deviations: Step-through pattern, Decreased stride length Gait velocity: reduced Gait velocity interpretation: <1.31 ft/sec, indicative of household ambulator   General Gait Details: Pt with slow, small, mildly unsteady steps but no LOB, min guard for safety when using rollator. MinA when using HHA  Stairs            Wheelchair Mobility    Modified Rankin (Stroke Patients Only) Modified Rankin (Stroke Patients Only) Pre-Morbid Rankin Score: Moderate disability Modified Rankin: Moderate disability     Balance Overall balance assessment: Needs assistance Sitting-balance support: No upper extremity supported Sitting balance-Leahy Scale: Good     Standing balance support: During functional activity, Bilateral upper extremity supported, Single extremity supported Standing balance-Leahy Scale: Poor Standing balance comment: reliant on UE support  Pertinent Vitals/Pain Pain Assessment Pain Assessment:  Faces Faces Pain Scale: Hurts little more Pain Location: bil legs Pain Descriptors / Indicators: Discomfort, Grimacing, Guarding Pain Intervention(s): Limited activity within patient's tolerance, Monitored during session, Repositioned    Home Living Family/patient expects to be discharged to:: Private residence Living Arrangements: Children Available Help at Discharge: Available 24 hours/day (she goes in between houses; lives with one for 3 weeks then the other for a week) Type of Home: House Home Access: Ramped entrance       Home Layout: One level Home Equipment: Agricultural consultant (2 wheels);Cane - single point;Rollator (4 wheels);Wheelchair - manual;Shower seat;BSC/3in1;Grab bars - tub/shower;Hand held shower head (transport chair; lift chair)      Prior Function Prior Level of Function : Independent/Modified Independent;Driving ("Ain;t nobody more independent"; manages medications adn finances - daughter just recently started managing meds but she is very careful about what she takes)             Mobility Comments: uses rollator; hx of falling posteriorly ADLs Comments: able to do her ADL tasks; daughter S bathing; "they won't let me cook"; daughter states if she stands on her legs "any length of time, her legs start shaking" - at baseline     Hand Dominance   Dominant Hand: Right    Extremity/Trunk Assessment   Upper Extremity Assessment Upper Extremity Assessment: Defer to OT evaluation    Lower Extremity Assessment Lower Extremity Assessment: RLE deficits/detail;LLE deficits/detail RLE Deficits / Details: bil lower extremity edema and reports of peripheral neuropathy; reports hx of R leg weakness; MMT score of 2+ hip flexion, 3+ knee extension, 4+ ankle dorsiflexion RLE Sensation: history of peripheral neuropathy LLE Deficits / Details: bil lower extremity edema and reports of peripheral neuropathy; reports hx of R leg weakness; MMT score of 4- hip flexion, 4- knee  extension, 4+ ankle dorsiflexion LLE Sensation: history of peripheral neuropathy    Cervical / Trunk Assessment Cervical / Trunk Assessment: Kyphotic  Communication   Communication: No difficulties  Cognition Arousal/Alertness: Awake/alert Behavior During Therapy: WFL for tasks assessed/performed Overall Cognitive Status: History of cognitive impairments - at baseline                                 General Comments: see OT note        General Comments General comments (skin integrity, edema, etc.): educated pt and daughter on waling program, monitoring SpO2 with pulse ox, and exercises pt can do to strengthen legs    Exercises     Assessment/Plan    PT Assessment Patient does not need any further PT services  PT Problem List         PT Treatment Interventions      PT Goals (Current goals can be found in the Care Plan section)  Acute Rehab PT Goals Patient Stated Goal: to go home PT Goal Formulation: All assessment and education complete, DC therapy Time For Goal Achievement: 08/25/22 Potential to Achieve Goals: Good    Frequency       Co-evaluation               AM-PAC PT "6 Clicks" Mobility  Outcome Measure Help needed turning from your back to your side while in a flat bed without using bedrails?: A Little Help needed moving from lying on your back to sitting on the side of a flat bed without using bedrails?: A Little Help needed moving  to and from a bed to a chair (including a wheelchair)?: A Little Help needed standing up from a chair using your arms (e.g., wheelchair or bedside chair)?: A Little Help needed to walk in hospital room?: A Little Help needed climbing 3-5 steps with a railing? : A Lot 6 Click Score: 17    End of Session Equipment Utilized During Treatment: Gait belt Activity Tolerance: Patient tolerated treatment well Patient left: in chair;with call bell/phone within reach;with family/visitor present   PT Visit  Diagnosis: Unsteadiness on feet (R26.81);Other abnormalities of gait and mobility (R26.89);Muscle weakness (generalized) (M62.81);History of falling (Z91.81);Difficulty in walking, not elsewhere classified (R26.2);Pain Pain - Right/Left:  (bil) Pain - part of body: Leg    Time: 1319-1340 PT Time Calculation (min) (ACUTE ONLY): 21 min   Charges:   PT Evaluation $PT Eval Moderate Complexity: 1 Mod          Raymond Gurney, PT, DPT Acute Rehabilitation Services  Office: [phone]   Jewel Baize 08/24/2022, 1:48 PM

## 2022-08-24 NOTE — Discharge Summary (Signed)
PATIENT DETAILS Name: Wendy Leon Age: 89 y.o. Sex: female Date of Birth: Dec 26, 1932 MRN: [id number]. Admitting Physician: Willeen Niece, MD LKG:[id number], Evlyn Courier., MD  Admit Date: 08/23/2022 Discharge date: 08/25/2022  Recommendations for Outpatient Follow-up:  Follow up with PCP in 1-2 weeks Please obtain CMP/CBC in one week Please ensure follow-up with neurology  Admitted From:  Home  Disposition: Home (refused home health)   Discharge Condition: good  CODE STATUS:   Code Status: Full Code   Diet recommendation:  Diet Order             Diet - low sodium heart healthy           Diet Carb Modified           Diet heart healthy/carb modified Room service appropriate? Yes; Fluid consistency: Thin  Diet effective now                    Brief Summary: Patient is a 89 y.o.  female history of TIA, HFpEF, DM-2, HTN, CKD stage IIIa, COPD who presented with diplopia/dysarthria/word finding difficulty.  Admitted to [facility] service for further workup.   Significant events: 4/27>> admit to [facility]   Significant studies: 4/28>> MRI brain: No CVA 4/28>> MRI brain: Moderate/severe stenosis of distal right V4, right P2-and distal left P3. 4/28>> LDL: 107 4/28>> A1c: 6.7 4/28>> echo: EF 60-65% 4/28>> carotid Doppler: No stenosis   Significant microbiology data: None   Procedures: None   Consults: Neuro  Brief Hospital Course: ?  TIA Presented with transient word finding difficulty/diplopia-the symptoms have completely resolved-she is back to her baseline.  Unclear whether this was a TIA or from encephalopathy of an unknown cause. Neuroimaging negative Workup as above Neurology-Dr. Xu-recommending to continue aspirin as previous.  She is intolerant to statin and gets PCSK9 inhibitor injections.    DM-2 Resume Lantus/Amaryl   HTN Resume usual antihypertensive regimen on discharge   Chronic HFpEF Euvolemic Continue diuretic regimen   Hypothyroidism Continue  levothyroxine-TSH stable   CKD stage IIIb  at baseline   COPD Stable Continue inhalers   OSA CPAP nightly    BMI: Estimated body mass index is 27.29 kg/m as calculated from the following:   Height as of this encounter: 5\' 4"  (1.626 m).   Weight as of this encounter: 72.1 kg.    Discharge Diagnoses:  Principal Problem:   TIA (transient ischemic attack) Active Problems:   Chronic diastolic heart failure (HCC)   COPD (chronic obstructive pulmonary disease) (HCC)   Obstructive sleep apnea syndrome   Type 2 diabetes mellitus without complication, without long-term current use of insulin (HCC)   CKD stage 3a, GFR 45-59 ml/min (HCC)   Essential hypertension   Hyponatremia   Discharge Instructions:  Activity:  As tolerated with Full fall precautions use walker/cane & assistance as needed  Discharge Instructions     Ambulatory referral to Neurology   Complete by: As directed    Follow up with any provider at [facility] in about 4 weeks. Thanks.   Diet - low sodium heart healthy   Complete by: As directed    Diet Carb Modified   Complete by: As directed    Discharge instructions   Complete by: As directed    Follow with Primary MD  Gordan Payment., MD in 1-2 weeks  Follow with Stroke Clinic-office will give you a call  Please get a complete blood count and chemistry panel checked by your Primary MD at your  next visit, and again as instructed by your Primary MD.  Get Medicines reviewed and adjusted: Please take all your medications with you for your next visit with your Primary MD  Laboratory/radiological data: Please request your Primary MD to go over all hospital tests and procedure/radiological results at the follow up, please ask your Primary MD to get all Hospital records sent to his/her office.  In some cases, they will be blood work, cultures and biopsy results pending at the time of your discharge. Please request that your primary care M.D. follows up on these  results.  Also Note the following: If you experience worsening of your admission symptoms, develop shortness of breath, life threatening emergency, suicidal or homicidal thoughts you must seek medical attention immediately by calling 911 or calling your MD immediately  if symptoms less severe.  You must read complete instructions/literature along with all the possible adverse reactions/side effects for all the Medicines you take and that have been prescribed to you. Take any new Medicines after you have completely understood and accpet all the possible adverse reactions/side effects.   Do not drive when taking Pain medications or sleeping medications (Benzodaizepines)  Do not take more than prescribed Pain, Sleep and Anxiety Medications. It is not advisable to combine anxiety,sleep and pain medications without talking with your primary care practitioner  Special Instructions: If you have smoked or chewed Tobacco  in the last 2 yrs please stop smoking, stop any regular Alcohol  and or any Recreational drug use.  Wear Seat belts while driving.  Please note: You were cared for by a hospitalist during your hospital stay. Once you are discharged, your primary care physician will handle any further medical issues. Please note that NO REFILLS for any discharge medications will be authorized once you are discharged, as it is imperative that you return to your primary care physician (or establish a relationship with a primary care physician if you do not have one) for your post hospital discharge needs so that they can reassess your need for medications and monitor your lab values.   Increase activity slowly   Complete by: As directed       Allergies as of 08/25/2022       Reactions   Doxycycline Nausea And Vomiting, Other (See Comments)   GI Intolerance   Nitrofurantoin Nausea And Vomiting   Cramps, GI Upset, Fever, weakness, nausea   Statins Other (See Comments)   pancreatitis    Sulfamethoxazole Rash   Trimethoprim Rash        Medication List     TAKE these medications    Accu-Chek Aviva Plus w/Device Kit See admin instructions.   albuterol 108 (90 Base) MCG/ACT inhaler Commonly known as: VENTOLIN HFA Inhale 2 puffs into the lungs every 4 (four) hours as needed for wheezing or shortness of breath.   albuterol (2.5 MG/3ML) 0.083% nebulizer solution Commonly known as: PROVENTIL Take 2.5 mg by nebulization every 4 (four) hours as needed for wheezing or shortness of breath.   ALPRAZolam 0.5 MG tablet Commonly known as: XANAX Take 0.5 mg by mouth 3 (three) times daily as needed for anxiety.   amLODipine 5 MG tablet Commonly known as: NORVASC Take 2.5 mg by mouth daily.   aspirin EC 81 MG tablet Take 81 mg by mouth daily.   benazepril 20 MG tablet Commonly known as: LOTENSIN Take 20 mg by mouth daily.   celecoxib 200 MG capsule Commonly known as: CELEBREX Take 200 mg by mouth daily as  needed for mild pain or moderate pain.   cephALEXin 250 MG capsule Commonly known as: KEFLEX Take 250 mg by mouth daily.   cyanocobalamin 1000 MCG/ML injection Commonly known as: VITAMIN B12 Inject into the muscle every 30 (thirty) days.   donepezil 5 MG tablet Commonly known as: ARICEPT Take 5 mg by mouth at bedtime.   fluticasone 50 MCG/ACT nasal spray Commonly known as: FLONASE Place 1 spray into the nose 2 (two) times daily.   furosemide 40 MG tablet Commonly known as: LASIX Take 1 tablet (40 mg total) by mouth 2 (two) times daily.   gabapentin 300 MG capsule Commonly known as: NEURONTIN Take 300 mg by mouth daily.   glimepiride 1 MG tablet Commonly known as: AMARYL Take 0.5 tablets by mouth in the morning and at bedtime.   insulin glargine 100 UNIT/ML Solostar Pen Commonly known as: LANTUS Inject 10-40 Units into the skin at bedtime. Sliding scale   isosorbide mononitrate 30 MG 24 hr tablet Commonly known as: IMDUR Take 1 tablet (30 mg  total) by mouth daily.   levothyroxine 75 MCG tablet Commonly known as: SYNTHROID Take 75 mcg by mouth daily.   loratadine 10 MG tablet Commonly known as: CLARITIN Take 10 mg by mouth daily as needed for allergies.   metFORMIN 500 MG 24 hr tablet Commonly known as: GLUCOPHAGE-XR Take 500 mg by mouth 2 (two) times daily.   metoprolol tartrate 50 MG tablet Commonly known as: LOPRESSOR Take 50 mg by mouth 2 (two) times daily.   montelukast 10 MG tablet Commonly known as: SINGULAIR Take 10 mg by mouth at bedtime.   Nitro-Bid 2 % ointment Generic drug: nitroGLYCERIN Apply 1 inch topically at bedtime. What changed: Another medication with the same name was changed. Make sure you understand how and when to take each.   nitroGLYCERIN 0.4 MG SL tablet Commonly known as: NITROSTAT DISSOLVE 1 TABLET UNDER THE  TONGUE EVERY 5 MINUTES AS NEEDED FOR CHEST PAIN. MAX OF 3 TABLETS IN 15 MINUTES. CALL 911 IF PAIN  PERSISTS. What changed: See the new instructions.   nystatin cream Commonly known as: MYCOSTATIN Apply 1 Application topically 2 (two) times daily.   nystatin powder Commonly known as: MYCOSTATIN/NYSTOP Apply 1 Application topically 2 (two) times daily.   Praluent 150 MG/ML Soaj Generic drug: Alirocumab Inject 1 mL into the skin every 14 (fourteen) days.   Symbicort 160-4.5 MCG/ACT inhaler Generic drug: budesonide-formoterol Inhale 2 puffs into the lungs 2 (two) times daily.   traMADol 50 MG tablet Commonly known as: ULTRAM Take 50 mg by mouth every 6 (six) hours as needed for moderate pain.        Follow-up Information     [facility]. Schedule an appointment as soon as possible for a visit in 1 month(s).   Specialty: Neurology Contact information: [address] [place] [address] [phone]        Gordan Payment., MD. Schedule an appointment as soon as possible for a visit in 1 week(s).    Specialty: Internal Medicine Contact information: [address] [place] [address] [phone]         Baldo Daub, MD. Schedule an appointment as soon as possible for a visit in 2 week(s).   Specialties: Cardiology, Radiology Contact information: [address] [place] [address] [phone]                Allergies  Allergen Reactions   Doxycycline  Nausea And Vomiting and Other (See Comments)    GI Intolerance   Nitrofurantoin Nausea And Vomiting    Cramps, GI Upset, Fever, weakness, nausea    Statins Other (See Comments)     pancreatitis   Sulfamethoxazole Rash   Trimethoprim Rash     Other Procedures/Studies: VAS US CAROTID  Result Date: 08/24/2022 Carotid Arterial Duplex Study Patient Name:  Wendy Leon  Date of Exam:   08/24/2022 Medical Rec #: [id number]     Accession #:    [id number] Date of Birth: [birth year]      Patient Gender: F Patient Age:   50 years Exam Location:  [facility] Procedure:      VAS US CAROTID Referring Phys: Scheryl Marten XU --------------------------------------------------------------------------------  Indications:       TIA, Speech disturbance and Visual disturbance. Risk Factors:      Hypertension, Diabetes, prior CVA. Other Factors:     Heart failure, OSA, COPD, CKD III. Comparison Study:  No prior study Performing Technologist: Sherren Kerns RVS  Examination Guidelines: A complete evaluation includes B-mode imaging, spectral Doppler, color Doppler, and power Doppler as needed of all accessible portions of each vessel. Bilateral testing is considered an integral part of a complete examination. Limited examinations for reoccurring indications may be performed as noted.  Right Carotid Findings: +----------+--------+--------+--------+------------------+------------------+           PSV cm/sEDV cm/sStenosisPlaque DescriptionComments           +----------+--------+--------+--------+------------------+------------------+  CCA Prox  90      16                                intimal thickening +----------+--------+--------+--------+------------------+------------------+ CCA Distal61      15                                intimal thickening +----------+--------+--------+--------+------------------+------------------+ ICA Prox  55      14              heterogenous                         +----------+--------+--------+--------+------------------+------------------+ ICA Mid   87      20                                                   +----------+--------+--------+--------+------------------+------------------+ ICA Distal87      19                                                   +----------+--------+--------+--------+------------------+------------------+ ECA       81      11                                                   +----------+--------+--------+--------+------------------+------------------+ +----------+--------+-------+--------+-------------------+           PSV cm/sEDV cmsDescribeArm Pressure (mmHG) +----------+--------+-------+--------+-------------------+ [id number]                                         +----------+--------+-------+--------+-------------------+ +---------+--------+--+--------+--+  VertebralPSV cm/s46EDV cm/s11 +---------+--------+--+--------+--+  Left Carotid Findings: +----------+--------+--------+--------+------------------+------------------+           PSV cm/sEDV cm/sStenosisPlaque DescriptionComments           +----------+--------+--------+--------+------------------+------------------+ CCA Prox  66      8                                 intimal thickening +----------+--------+--------+--------+------------------+------------------+ CCA Distal72      13                                intimal thickening +----------+--------+--------+--------+------------------+------------------+ ICA Prox  93      23               heterogenous                         +----------+--------+--------+--------+------------------+------------------+ ICA Mid   92      19                                                   +----------+--------+--------+--------+------------------+------------------+ ICA Distal101     22                                                   +----------+--------+--------+--------+------------------+------------------+ ECA       44      5                                                    +----------+--------+--------+--------+------------------+------------------+ +----------+--------+--------+--------+-------------------+           PSV cm/sEDV cm/sDescribeArm Pressure (mmHG) +----------+--------+--------+--------+-------------------+ [id number]                                          +----------+--------+--------+--------+-------------------+ +---------+--------+--+--------+-+ VertebralPSV cm/s28EDV cm/s5 +---------+--------+--+--------+-+   Summary: Right Carotid: The extracranial vessels were near-normal with only minimal wall                thickening or plaque. Left Carotid: The extracranial vessels were near-normal with only minimal wall               thickening or plaque. Vertebrals:  Bilateral vertebral arteries demonstrate antegrade flow. Subclavians: Normal flow hemodynamics were seen in bilateral subclavian              arteries. *See table(s) above for measurements and observations.     Preliminary    DG CHEST PORT 1 VIEW  Result Date: 08/24/2022 CLINICAL DATA:  Leukocytosis. EXAM: PORTABLE CHEST 1 VIEW COMPARISON:  Chest radiograph 02/08/2020, chest CT 05/18/2017 FINDINGS: Stable biapical pleuroparenchymal scarring. Additional linear scarring in the right upper lobe. No acute airspace disease. The heart is normal in size. Stable mediastinal contours. Aortic atherosclerosis. No pulmonary edema, pleural effusion or pneumothorax. On limited assessment, no acute  osseous findings. IMPRESSION: 1. No  acute abnormality. 2. Stable biapical pleuroparenchymal scarring. Electronically Signed   By: Narda Rutherford M.D.   On: 08/24/2022 12:25   MR BRAIN WO CONTRAST  Result Date: 08/24/2022 CLINICAL DATA:  Initial evaluation for TIA. EXAM: MRI HEAD WITHOUT CONTRAST MRA HEAD WITHOUT CONTRAST TECHNIQUE: Multiplanar, multi-echo pulse sequences of the brain and surrounding structures were acquired without intravenous contrast. Angiographic images of the Circle of Willis were acquired using MRA technique without intravenous contrast. COMPARISON:  Prior CT from 08/23/2022. FINDINGS: MRI HEAD FINDINGS Brain: Cerebral volume within normal limits for age. T2/FLAIR hyperintensity involving the periventricular deep white matter both cerebral hemispheres, most like related chronic microvascular ischemic disease, overall mild in nature and less than is typically seen for age. No evidence for acute or subacute infarct. Gray-white matter differentiation maintained. No areas of chronic cortical infarction. No acute or chronic intracranial blood products. No mass lesion, midline shift or mass effect. No hydrocephalus or extra-axial fluid collection. Pituitary gland and suprasellar region within normal limits. Vascular: Major intracranial vascular flow voids are maintained. Skull and upper cervical spine: Craniocervical junction within normal limits. Bone marrow signal intensity normal. No scalp soft tissue abnormality. Sinuses/Orbits: Patient status post bilateral ocular lens replacement. Paranasal sinuses are largely clear. Trace right mastoid effusion, of doubtful significance. Other: None. MRA HEAD FINDINGS Anterior circulation: Visualized distal cervical segments of the internal carotid arteries are patent with antegrade flow. Atheromatous irregularity throughout the carotid siphons without hemodynamically significant stenosis. Tiny 2 mm outpouching extending medially from the cavernous right  ICA suspicious for a small aneurysm (series 9, image 72). A1 segments patent bilaterally. Normal anterior communicating complex. Both ACAs patent to their distal aspects without significant stenosis. No M1 stenosis or occlusion. No proximal MCA branch occlusion or high-grade stenosis. Distal MCA branches perfused and symmetric, although demonstrates small vessel atheromatous irregularity. Posterior circulation: Left V4 segment dominant and patent without stenosis. Focal severe stenosis involving the distal right V4 segment just prior to the vertebrobasilar junction (series 1057, image 9). Both PICA patent. Basilar patent without stenosis. Superior cerebral arteries patent bilaterally. Right PCA supplied via the basilar. Predominant fetal type origin of the left PCA. Focal moderate to severe right P2 stenosis (series 1057, image 14). Additional moderate to severe distal left P3 stenosis (series 1057, image 14). PCAs remain patent to their distal aspects. Anatomic variants: As above. IMPRESSION: MRI HEAD IMPRESSION: 1. No acute intracranial abnormality. 2. Mild chronic microvascular ischemic disease for age. MRA HEAD IMPRESSION: 1. Negative intracranial MRA for large vessel occlusion. 2. Intracranial atherosclerotic disease, with most notable findings including moderate to severe stenoses at the distal right V4 segment, right P2 segment, and distal left P3 segment. 3. 2 mm outpouching extending medially from the cavernous right ICA suspicious for a small aneurysm. Electronically Signed   By: Rise Mu M.D.   On: 08/24/2022 04:29   MR ANGIO HEAD WO CONTRAST  Result Date: 08/24/2022 CLINICAL DATA:  Initial evaluation for TIA. EXAM: MRI HEAD WITHOUT CONTRAST MRA HEAD WITHOUT CONTRAST TECHNIQUE: Multiplanar, multi-echo pulse sequences of the brain and surrounding structures were acquired without intravenous contrast. Angiographic images of the Circle of Willis were acquired using MRA technique without  intravenous contrast. COMPARISON:  Prior CT from 08/23/2022. FINDINGS: MRI HEAD FINDINGS Brain: Cerebral volume within normal limits for age. T2/FLAIR hyperintensity involving the periventricular deep white matter both cerebral hemispheres, most like related chronic microvascular ischemic disease, overall mild in nature and less than is typically seen for age. No evidence for acute  or subacute infarct. Gray-white matter differentiation maintained. No areas of chronic cortical infarction. No acute or chronic intracranial blood products. No mass lesion, midline shift or mass effect. No hydrocephalus or extra-axial fluid collection. Pituitary gland and suprasellar region within normal limits. Vascular: Major intracranial vascular flow voids are maintained. Skull and upper cervical spine: Craniocervical junction within normal limits. Bone marrow signal intensity normal. No scalp soft tissue abnormality. Sinuses/Orbits: Patient status post bilateral ocular lens replacement. Paranasal sinuses are largely clear. Trace right mastoid effusion, of doubtful significance. Other: None. MRA HEAD FINDINGS Anterior circulation: Visualized distal cervical segments of the internal carotid arteries are patent with antegrade flow. Atheromatous irregularity throughout the carotid siphons without hemodynamically significant stenosis. Tiny 2 mm outpouching extending medially from the cavernous right ICA suspicious for a small aneurysm (series 9, image 72). A1 segments patent bilaterally. Normal anterior communicating complex. Both ACAs patent to their distal aspects without significant stenosis. No M1 stenosis or occlusion. No proximal MCA branch occlusion or high-grade stenosis. Distal MCA branches perfused and symmetric, although demonstrates small vessel atheromatous irregularity. Posterior circulation: Left V4 segment dominant and patent without stenosis. Focal severe stenosis involving the distal right V4 segment just prior to the  vertebrobasilar junction (series 1057, image 9). Both PICA patent. Basilar patent without stenosis. Superior cerebral arteries patent bilaterally. Right PCA supplied via the basilar. Predominant fetal type origin of the left PCA. Focal moderate to severe right P2 stenosis (series 1057, image 14). Additional moderate to severe distal left P3 stenosis (series 1057, image 14). PCAs remain patent to their distal aspects. Anatomic variants: As above. IMPRESSION: MRI HEAD IMPRESSION: 1. No acute intracranial abnormality. 2. Mild chronic microvascular ischemic disease for age. MRA HEAD IMPRESSION: 1. Negative intracranial MRA for large vessel occlusion. 2. Intracranial atherosclerotic disease, with most notable findings including moderate to severe stenoses at the distal right V4 segment, right P2 segment, and distal left P3 segment. 3. 2 mm outpouching extending medially from the cavernous right ICA suspicious for a small aneurysm. Electronically Signed   By: Rise Mu M.D.   On: 08/24/2022 04:29   CT HEAD WO CONTRAST  Result Date: 08/23/2022 CLINICAL DATA:  Neuro deficit EXAM: CT HEAD WITHOUT CONTRAST TECHNIQUE: Contiguous axial images were obtained from the base of the skull through the vertex without intravenous contrast. RADIATION DOSE REDUCTION: This exam was performed according to the departmental dose-optimization program which includes automated exposure control, adjustment of the mA and/or kV according to patient size and/or use of iterative reconstruction technique. COMPARISON:  None Available. FINDINGS: Brain: No evidence of acute infarction, hemorrhage, hydrocephalus, extra-axial collection or mass lesion/mass effect. Periventricular and deep white matter hypodensity. Vascular: No hyperdense vessel or unexpected calcification. Skull: Normal. Negative for fracture or focal lesion. Sinuses/Orbits: No acute finding. Other: None. IMPRESSION: No acute intracranial pathology. Small-vessel white matter  disease. Electronically Signed   By: Jearld Lesch M.D.   On: 08/23/2022 13:25     TODAY-DAY OF DISCHARGE:  Subjective:   Katharina Caper today has no headache,no chest abdominal pain,no new weakness tingling or numbness, feels much better wants to go home today.   Objective:   Blood pressure (!) 119/47, pulse 61, temperature 97.6 F (36.4 C), temperature source Oral, resp. rate 19, height 5\' 4"  (1.626 m), weight 72.1 kg, SpO2 96 %.  Intake/Output Summary (Last 24 hours) at 08/25/2022 1003 Last data filed at 08/25/2022 0900 Gross per 24 hour  Intake 240 ml  Output --  Net 240 ml   Ceasar Mons  Weights   08/23/22 1223  Weight: 72.1 kg    Exam: Awake Alert, Oriented *3, No new F.N deficits, Normal affect [place].AT,PERRAL Supple Neck,No JVD, No cervical lymphadenopathy appriciated.  Symmetrical Chest wall movement, Good air movement bilaterally, CTAB RRR,No Gallops,Rubs or new Murmurs, No Parasternal Heave +ve B.Sounds, Abd Soft, Non tender, No organomegaly appriciated, No rebound -guarding or rigidity. No Cyanosis, Clubbing or edema, No new Rash or bruise   PERTINENT RADIOLOGIC STUDIES: VAS US CAROTID  Result Date: 08/24/2022 Carotid Arterial Duplex Study Patient Name:  Wendy Leon  Date of Exam:   08/24/2022 Medical Rec #: [id number]     Accession #:    [id number] Date of Birth: [birth year]      Patient Gender: F Patient Age:   40 years Exam Location:  [facility] Procedure:      VAS US CAROTID Referring Phys: Scheryl Marten XU --------------------------------------------------------------------------------  Indications:       TIA, Speech disturbance and Visual disturbance. Risk Factors:      Hypertension, Diabetes, prior CVA. Other Factors:     Heart failure, OSA, COPD, CKD III. Comparison Study:  No prior study Performing Technologist: Sherren Kerns RVS  Examination Guidelines: A complete evaluation includes B-mode imaging, spectral Doppler, color Doppler, and power Doppler as needed of all  accessible portions of each vessel. Bilateral testing is considered an integral part of a complete examination. Limited examinations for reoccurring indications may be performed as noted.  Right Carotid Findings: +----------+--------+--------+--------+------------------+------------------+           PSV cm/sEDV cm/sStenosisPlaque DescriptionComments           +----------+--------+--------+--------+------------------+------------------+ CCA Prox  90      16                                intimal thickening +----------+--------+--------+--------+------------------+------------------+ CCA Distal61      15                                intimal thickening +----------+--------+--------+--------+------------------+------------------+ ICA Prox  55      14              heterogenous                         +----------+--------+--------+--------+------------------+------------------+ ICA Mid   87      20                                                   +----------+--------+--------+--------+------------------+------------------+ ICA Distal87      19                                                   +----------+--------+--------+--------+------------------+------------------+ ECA       81      11                                                   +----------+--------+--------+--------+------------------+------------------+ +----------+--------+-------+--------+-------------------+  PSV cm/sEDV cmsDescribeArm Pressure (mmHG) +----------+--------+-------+--------+-------------------+ [id number]                                         +----------+--------+-------+--------+-------------------+ +---------+--------+--+--------+--+ VertebralPSV cm/s46EDV cm/s11 +---------+--------+--+--------+--+  Left Carotid Findings: +----------+--------+--------+--------+------------------+------------------+           PSV cm/sEDV cm/sStenosisPlaque  DescriptionComments           +----------+--------+--------+--------+------------------+------------------+ CCA Prox  66      8                                 intimal thickening +----------+--------+--------+--------+------------------+------------------+ CCA Distal72      13                                intimal thickening +----------+--------+--------+--------+------------------+------------------+ ICA Prox  93      23              heterogenous                         +----------+--------+--------+--------+------------------+------------------+ ICA Mid   92      19                                                   +----------+--------+--------+--------+------------------+------------------+ ICA Distal101     22                                                   +----------+--------+--------+--------+------------------+------------------+ ECA       44      5                                                    +----------+--------+--------+--------+------------------+------------------+ +----------+--------+--------+--------+-------------------+           PSV cm/sEDV cm/sDescribeArm Pressure (mmHG) +----------+--------+--------+--------+-------------------+ [id number]                                          +----------+--------+--------+--------+-------------------+ +---------+--------+--+--------+-+ VertebralPSV cm/s28EDV cm/s5 +---------+--------+--+--------+-+   Summary: Right Carotid: The extracranial vessels were near-normal with only minimal wall                thickening or plaque. Left Carotid: The extracranial vessels were near-normal with only minimal wall               thickening or plaque. Vertebrals:  Bilateral vertebral arteries demonstrate antegrade flow. Subclavians: Normal flow hemodynamics were seen in bilateral subclavian              arteries. *See table(s) above for measurements and observations.     Preliminary    DG CHEST PORT  1 VIEW  Result Date: 08/24/2022 CLINICAL DATA:  Leukocytosis. EXAM: PORTABLE CHEST 1 VIEW COMPARISON:  Chest radiograph  02/08/2020, chest CT 05/18/2017 FINDINGS: Stable biapical pleuroparenchymal scarring. Additional linear scarring in the right upper lobe. No acute airspace disease. The heart is normal in size. Stable mediastinal contours. Aortic atherosclerosis. No pulmonary edema, pleural effusion or pneumothorax. On limited assessment, no acute osseous findings. IMPRESSION: 1. No acute abnormality. 2. Stable biapical pleuroparenchymal scarring. Electronically Signed   By: Narda Rutherford M.D.   On: 08/24/2022 12:25   MR BRAIN WO CONTRAST  Result Date: 08/24/2022 CLINICAL DATA:  Initial evaluation for TIA. EXAM: MRI HEAD WITHOUT CONTRAST MRA HEAD WITHOUT CONTRAST TECHNIQUE: Multiplanar, multi-echo pulse sequences of the brain and surrounding structures were acquired without intravenous contrast. Angiographic images of the Circle of Willis were acquired using MRA technique without intravenous contrast. COMPARISON:  Prior CT from 08/23/2022. FINDINGS: MRI HEAD FINDINGS Brain: Cerebral volume within normal limits for age. T2/FLAIR hyperintensity involving the periventricular deep white matter both cerebral hemispheres, most like related chronic microvascular ischemic disease, overall mild in nature and less than is typically seen for age. No evidence for acute or subacute infarct. Gray-white matter differentiation maintained. No areas of chronic cortical infarction. No acute or chronic intracranial blood products. No mass lesion, midline shift or mass effect. No hydrocephalus or extra-axial fluid collection. Pituitary gland and suprasellar region within normal limits. Vascular: Major intracranial vascular flow voids are maintained. Skull and upper cervical spine: Craniocervical junction within normal limits. Bone marrow signal intensity normal. No scalp soft tissue abnormality. Sinuses/Orbits: Patient status  post bilateral ocular lens replacement. Paranasal sinuses are largely clear. Trace right mastoid effusion, of doubtful significance. Other: None. MRA HEAD FINDINGS Anterior circulation: Visualized distal cervical segments of the internal carotid arteries are patent with antegrade flow. Atheromatous irregularity throughout the carotid siphons without hemodynamically significant stenosis. Tiny 2 mm outpouching extending medially from the cavernous right ICA suspicious for a small aneurysm (series 9, image 72). A1 segments patent bilaterally. Normal anterior communicating complex. Both ACAs patent to their distal aspects without significant stenosis. No M1 stenosis or occlusion. No proximal MCA branch occlusion or high-grade stenosis. Distal MCA branches perfused and symmetric, although demonstrates small vessel atheromatous irregularity. Posterior circulation: Left V4 segment dominant and patent without stenosis. Focal severe stenosis involving the distal right V4 segment just prior to the vertebrobasilar junction (series 1057, image 9). Both PICA patent. Basilar patent without stenosis. Superior cerebral arteries patent bilaterally. Right PCA supplied via the basilar. Predominant fetal type origin of the left PCA. Focal moderate to severe right P2 stenosis (series 1057, image 14). Additional moderate to severe distal left P3 stenosis (series 1057, image 14). PCAs remain patent to their distal aspects. Anatomic variants: As above. IMPRESSION: MRI HEAD IMPRESSION: 1. No acute intracranial abnormality. 2. Mild chronic microvascular ischemic disease for age. MRA HEAD IMPRESSION: 1. Negative intracranial MRA for large vessel occlusion. 2. Intracranial atherosclerotic disease, with most notable findings including moderate to severe stenoses at the distal right V4 segment, right P2 segment, and distal left P3 segment. 3. 2 mm outpouching extending medially from the cavernous right ICA suspicious for a small aneurysm.  Electronically Signed   By: Rise Mu M.D.   On: 08/24/2022 04:29   MR ANGIO HEAD WO CONTRAST  Result Date: 08/24/2022 CLINICAL DATA:  Initial evaluation for TIA. EXAM: MRI HEAD WITHOUT CONTRAST MRA HEAD WITHOUT CONTRAST TECHNIQUE: Multiplanar, multi-echo pulse sequences of the brain and surrounding structures were acquired without intravenous contrast. Angiographic images of the Circle of Willis were acquired using MRA technique without intravenous contrast. COMPARISON:  Prior  CT from 08/23/2022. FINDINGS: MRI HEAD FINDINGS Brain: Cerebral volume within normal limits for age. T2/FLAIR hyperintensity involving the periventricular deep white matter both cerebral hemispheres, most like related chronic microvascular ischemic disease, overall mild in nature and less than is typically seen for age. No evidence for acute or subacute infarct. Gray-white matter differentiation maintained. No areas of chronic cortical infarction. No acute or chronic intracranial blood products. No mass lesion, midline shift or mass effect. No hydrocephalus or extra-axial fluid collection. Pituitary gland and suprasellar region within normal limits. Vascular: Major intracranial vascular flow voids are maintained. Skull and upper cervical spine: Craniocervical junction within normal limits. Bone marrow signal intensity normal. No scalp soft tissue abnormality. Sinuses/Orbits: Patient status post bilateral ocular lens replacement. Paranasal sinuses are largely clear. Trace right mastoid effusion, of doubtful significance. Other: None. MRA HEAD FINDINGS Anterior circulation: Visualized distal cervical segments of the internal carotid arteries are patent with antegrade flow. Atheromatous irregularity throughout the carotid siphons without hemodynamically significant stenosis. Tiny 2 mm outpouching extending medially from the cavernous right ICA suspicious for a small aneurysm (series 9, image 72). A1 segments patent bilaterally.  Normal anterior communicating complex. Both ACAs patent to their distal aspects without significant stenosis. No M1 stenosis or occlusion. No proximal MCA branch occlusion or high-grade stenosis. Distal MCA branches perfused and symmetric, although demonstrates small vessel atheromatous irregularity. Posterior circulation: Left V4 segment dominant and patent without stenosis. Focal severe stenosis involving the distal right V4 segment just prior to the vertebrobasilar junction (series 1057, image 9). Both PICA patent. Basilar patent without stenosis. Superior cerebral arteries patent bilaterally. Right PCA supplied via the basilar. Predominant fetal type origin of the left PCA. Focal moderate to severe right P2 stenosis (series 1057, image 14). Additional moderate to severe distal left P3 stenosis (series 1057, image 14). PCAs remain patent to their distal aspects. Anatomic variants: As above. IMPRESSION: MRI HEAD IMPRESSION: 1. No acute intracranial abnormality. 2. Mild chronic microvascular ischemic disease for age. MRA HEAD IMPRESSION: 1. Negative intracranial MRA for large vessel occlusion. 2. Intracranial atherosclerotic disease, with most notable findings including moderate to severe stenoses at the distal right V4 segment, right P2 segment, and distal left P3 segment. 3. 2 mm outpouching extending medially from the cavernous right ICA suspicious for a small aneurysm. Electronically Signed   By: Rise Mu M.D.   On: 08/24/2022 04:29   CT HEAD WO CONTRAST  Result Date: 08/23/2022 CLINICAL DATA:  Neuro deficit EXAM: CT HEAD WITHOUT CONTRAST TECHNIQUE: Contiguous axial images were obtained from the base of the skull through the vertex without intravenous contrast. RADIATION DOSE REDUCTION: This exam was performed according to the departmental dose-optimization program which includes automated exposure control, adjustment of the mA and/or kV according to patient size and/or use of iterative  reconstruction technique. COMPARISON:  None Available. FINDINGS: Brain: No evidence of acute infarction, hemorrhage, hydrocephalus, extra-axial collection or mass lesion/mass effect. Periventricular and deep white matter hypodensity. Vascular: No hyperdense vessel or unexpected calcification. Skull: Normal. Negative for fracture or focal lesion. Sinuses/Orbits: No acute finding. Other: None. IMPRESSION: No acute intracranial pathology. Small-vessel white matter disease. Electronically Signed   By: Jearld Lesch M.D.   On: 08/23/2022 13:25     PERTINENT LAB RESULTS: CBC: Recent Labs    08/23/22 1242 08/24/22 0231  WBC 14.3* 15.4*  HGB 12.0 12.4  HCT 36.9 36.1  PLT 391 413*   CMET CMP     Component Value Date/Time   NA 134 (L) 08/24/2022  0231   NA 136 07/13/2020 0946   K 3.4 (L) 08/24/2022 0231   CL 94 (L) 08/24/2022 0231   CO2 26 08/24/2022 0231   GLUCOSE 143 (H) 08/24/2022 0231   BUN 28 (H) 08/24/2022 0231   BUN 15 07/13/2020 0946   CREATININE 1.23 (H) 08/24/2022 0231   CALCIUM 9.6 08/24/2022 0231   PROT 7.1 08/24/2022 0231   PROT 6.9 07/13/2020 0946   ALBUMIN 3.7 08/24/2022 0231   ALBUMIN 4.4 07/13/2020 0946   AST 21 08/24/2022 0231   ALT 22 08/24/2022 0231   ALKPHOS 47 08/24/2022 0231   BILITOT 0.7 08/24/2022 0231   BILITOT 0.3 07/13/2020 0946   GFRNONAA 42 (L) 08/24/2022 0231   GFRAA 48 (L) 03/28/2019 1519    GFR Estimated Creatinine Clearance: 30.8 mL/min (A) (by C-G formula based on SCr of 1.23 mg/dL (H)). No results for input(s): "LIPASE", "AMYLASE" in the last 72 hours. No results for input(s): "CKTOTAL", "CKMB", "CKMBINDEX", "TROPONINI" in the last 72 hours. Invalid input(s): "POCBNP" No results for input(s): "DDIMER" in the last 72 hours. Recent Labs    08/24/22 0231  HGBA1C 6.7*   Recent Labs    08/24/22 0231  CHOL 190  HDL 66  LDLCALC 107*  TRIG 87  CHOLHDL 2.9   Recent Labs    08/24/22 0231  TSH 3.595   No results for input(s):  "VITAMINB12", "FOLATE", "FERRITIN", "TIBC", "IRON", "RETICCTPCT" in the last 72 hours. Coags: Recent Labs    08/23/22 1242  INR 0.9   Microbiology: No results found for this or any previous visit (from the past 240 hour(s)).  FURTHER DISCHARGE INSTRUCTIONS:  Get Medicines reviewed and adjusted: Please take all your medications with you for your next visit with your Primary MD  Laboratory/radiological data: Please request your Primary MD to go over all hospital tests and procedure/radiological results at the follow up, please ask your Primary MD to get all Hospital records sent to his/her office.  In some cases, they will be blood work, cultures and biopsy results pending at the time of your discharge. Please request that your primary care M.D. goes through all the records of your hospital data and follows up on these results.  Also Note the following: If you experience worsening of your admission symptoms, develop shortness of breath, life threatening emergency, suicidal or homicidal thoughts you must seek medical attention immediately by calling 911 or calling your MD immediately  if symptoms less severe.  You must read complete instructions/literature along with all the possible adverse reactions/side effects for all the Medicines you take and that have been prescribed to you. Take any new Medicines after you have completely understood and accpet all the possible adverse reactions/side effects.   Do not drive when taking Pain medications or sleeping medications (Benzodaizepines)  Do not take more than prescribed Pain, Sleep and Anxiety Medications. It is not advisable to combine anxiety,sleep and pain medications without talking with your primary care practitioner  Special Instructions: If you have smoked or chewed Tobacco  in the last 2 yrs please stop smoking, stop any regular Alcohol  and or any Recreational drug use.  Wear Seat belts while driving.  Please note: You were cared  for by a hospitalist during your hospital stay. Once you are discharged, your primary care physician will handle any further medical issues. Please note that NO REFILLS for any discharge medications will be authorized once you are discharged, as it is imperative that you return to your primary care  physician (or establish a relationship with a primary care physician if you do not have one) for your post hospital discharge needs so that they can reassess your need for medications and monitor your lab values.  Total Time spent coordinating discharge including counseling, education and face to face time equals greater than 30 minutes.  SignedJeoffrey Massed 08/25/2022 10:03 AM

## 2022-08-25 DIAGNOSIS — N1831 Chronic kidney disease, stage 3a: Secondary | ICD-10-CM

## 2022-08-25 DIAGNOSIS — I10 Essential (primary) hypertension: Secondary | ICD-10-CM | POA: Diagnosis not present

## 2022-08-25 DIAGNOSIS — I5032 Chronic diastolic (congestive) heart failure: Secondary | ICD-10-CM | POA: Diagnosis not present

## 2022-08-25 DIAGNOSIS — G459 Transient cerebral ischemic attack, unspecified: Secondary | ICD-10-CM | POA: Diagnosis not present

## 2022-08-25 LAB — ECHOCARDIOGRAM COMPLETE
AR max vel: 1.82 cm2
AV Area VTI: 1.86 cm2
AV Area mean vel: 1.76 cm2
AV Mean grad: 4 mmHg
AV Peak grad: 5.9 mmHg
Ao pk vel: 1.21 m/s
Area-P 1/2: 1.77 cm2
Calc EF: 53.7 %
Height: 64 in
MV VTI: 1.75 cm2
S' Lateral: 2.8 cm
Single Plane A2C EF: 53.7 %
Single Plane A4C EF: 52.6 %
Weight: 2544 oz

## 2022-08-25 LAB — GLUCOSE, CAPILLARY
Glucose-Capillary: 137 mg/dL — ABNORMAL HIGH (ref 70–99)
Glucose-Capillary: 175 mg/dL — ABNORMAL HIGH (ref 70–99)
Glucose-Capillary: 135 mg/dL — ABNORMAL HIGH (ref 70–99)

## 2022-08-25 NOTE — Progress Notes (Signed)
Explained discharge instructions to patient. Reviewed follow up appointment and next medication administration times. Also reviewed education. Patient verbalized having an understanding for instructions given. All belongings are in the patient's possession to include TOC meds. IV and telemetry were removed. CCMD was notified. No other needs verbalized. Transporting downstairs for discharge. 

## 2022-08-25 NOTE — Plan of Care (Signed)
Problem: Education: Goal: Knowledge of General Education information will improve Description: Including pain rating scale, medication(s)/side effects and non-pharmacologic comfort measures Outcome: Adequate for Discharge   Problem: Health Behavior/Discharge Planning: Goal: Ability to manage health-related needs will improve Outcome: Adequate for Discharge   Problem: Clinical Measurements: Goal: Ability to maintain clinical measurements within normal limits will improve Outcome: Adequate for Discharge Goal: Will remain free from infection Outcome: Adequate for Discharge Goal: Diagnostic test results will improve Outcome: Adequate for Discharge Goal: Respiratory complications will improve Outcome: Adequate for Discharge Goal: Cardiovascular complication will be avoided Outcome: Adequate for Discharge   Problem: Activity: Goal: Risk for activity intolerance will decrease Outcome: Adequate for Discharge   Problem: Nutrition: Goal: Adequate nutrition will be maintained Outcome: Adequate for Discharge   Problem: Coping: Goal: Level of anxiety will decrease Outcome: Adequate for Discharge   Problem: Elimination: Goal: Will not experience complications related to bowel motility Outcome: Adequate for Discharge Goal: Will not experience complications related to urinary retention Outcome: Adequate for Discharge   Problem: Pain Managment: Goal: General experience of comfort will improve Outcome: Adequate for Discharge   Problem: Safety: Goal: Ability to remain free from injury will improve Outcome: Adequate for Discharge   Problem: Skin Integrity: Goal: Risk for impaired skin integrity will decrease Outcome: Adequate for Discharge   Problem: Education: Goal: Knowledge of disease or condition will improve Outcome: Adequate for Discharge Goal: Knowledge of secondary prevention will improve (MUST DOCUMENT ALL) Outcome: Adequate for Discharge Goal: Knowledge of patient  specific risk factors will improve (Mark N/A or DELETE if not current risk factor) Outcome: Adequate for Discharge   Problem: Ischemic Stroke/TIA Tissue Perfusion: Goal: Complications of ischemic stroke/TIA will be minimized Outcome: Adequate for Discharge   Problem: Coping: Goal: Will verbalize positive feelings about self Outcome: Adequate for Discharge Goal: Will identify appropriate support needs Outcome: Adequate for Discharge   Problem: Health Behavior/Discharge Planning: Goal: Ability to manage health-related needs will improve Outcome: Adequate for Discharge Goal: Goals will be collaboratively established with patient/family Outcome: Adequate for Discharge   Problem: Self-Care: Goal: Ability to participate in self-care as condition permits will improve Outcome: Adequate for Discharge Goal: Verbalization of feelings and concerns over difficulty with self-care will improve Outcome: Adequate for Discharge Goal: Ability to communicate needs accurately will improve Outcome: Adequate for Discharge   Problem: Nutrition: Goal: Risk of aspiration will decrease Outcome: Adequate for Discharge Goal: Dietary intake will improve Outcome: Adequate for Discharge   Problem: Education: Goal: Ability to describe self-care measures that may prevent or decrease complications (Diabetes Survival Skills Education) will improve Outcome: Adequate for Discharge Goal: Individualized Educational Video(s) Outcome: Adequate for Discharge   Problem: Coping: Goal: Ability to adjust to condition or change in health will improve Outcome: Adequate for Discharge   Problem: Fluid Volume: Goal: Ability to maintain a balanced intake and output will improve Outcome: Adequate for Discharge   Problem: Health Behavior/Discharge Planning: Goal: Ability to identify and utilize available resources and services will improve Outcome: Adequate for Discharge Goal: Ability to manage health-related needs will  improve Outcome: Adequate for Discharge   Problem: Metabolic: Goal: Ability to maintain appropriate glucose levels will improve Outcome: Adequate for Discharge   Problem: Nutritional: Goal: Maintenance of adequate nutrition will improve Outcome: Adequate for Discharge Goal: Progress toward achieving an optimal weight will improve Outcome: Adequate for Discharge   Problem: Skin Integrity: Goal: Risk for impaired skin integrity will decrease Outcome: Adequate for Discharge   Problem: Tissue Perfusion: Goal: Adequacy   of tissue perfusion will improve Outcome: Adequate for Discharge   

## 2022-08-25 NOTE — TOC Transition Note (Signed)
Transition of Care [facility]) - CM/SW Discharge Note   Patient Details  Name: KAINA ORENGO MRN: [id number] Date of Birth: Aug 10, 1932  [facility]) CM/SW Contact:  Gordy Clement, RN Phone Number: 08/25/2022, 11:43 AM   Clinical Narrative:     CM was asked to speak to patient about refusing HH PT and OT. CM met with patient - Originally, patient declined any HH that was recommended. PT and OT have actually changed their recs since last visit to no HH needs.  Patient also does not need any DME.  Son will transport home .           Patient Goals and CMS Choice      Discharge Placement                         Discharge Plan and Services Additional resources added to the After Visit Summary for                                       Social Determinants of Health (SDOH) Interventions SDOH Screenings   Food Insecurity: No Food Insecurity (08/24/2022)  Housing: Low Risk  (08/24/2022)  Transportation Needs: No Transportation Needs (08/24/2022)  Utilities: Not At Risk (08/24/2022)  Tobacco Use: High Risk (08/24/2022)     Readmission Risk Interventions     No data to display

## 2022-08-29 ENCOUNTER — Other Ambulatory Visit: Payer: Self-pay | Admitting: Cardiology

## 2022-08-29 NOTE — Telephone Encounter (Signed)
Refill sent to pharmacy.   

## 2022-09-11 DIAGNOSIS — I720 Aneurysm of carotid artery: Secondary | ICD-10-CM | POA: Insufficient documentation

## 2022-09-11 HISTORY — DX: Aneurysm of carotid artery: I72.0

## 2022-09-14 NOTE — Progress Notes (Signed)
Cardiology Office Note:    Date:  09/15/2022   ID:  Wendy Leon, DOB 1933-04-16, MRN [id number]  PCP:  Gordan Payment., MD  Cardiologist:  Norman Herrlich, MD    Referring MD: Gordan Payment., MD    ASSESSMENT:    1. Coronary artery disease of native artery of native heart with stable angina pectoris (HCC)   2. Hypertensive heart disease with heart failure (HCC)   3. CKD stage 3 due to type 2 diabetes mellitus (HCC)   4. Neurogenic orthostatic hypotension (HCC)   5. Mixed hyperlipidemia   6. Statin intolerance   7. Left bundle branch block    PLAN:    In order of problems listed above:  Overall Lotus is doing well with medical therapy for CAD presently having no anginal discomfort should continue combined treatment including aspirin beta-blocker calcium channel blocker oral nitrate and topical nitroglycerin at bedtime. Stable she has had previous severe symptomatic orthostatic hypotension I would not try to tighten her blood pressure control and continue her current loop diuretic antihypertensives Stable CKD Continue PCSK9 inhibitors statin intolerant 2 weeks ZIO monitor with her complaints where she feels the heart rate is too slow this patient in view of her recent hospitalization.  I am reluctant to stop her beta-blocker without objective evidence of bradycardia   Next appointment: 6 months   Medication Adjustments/Labs and Tests Ordered: Current medicines are reviewed at length with the patient today.  Concerns regarding medicines are outlined above.  No orders of the defined types were placed in this encounter.  No orders of the defined types were placed in this encounter.   Chief Complaint  Patient presents with   Follow-up   Coronary Artery Disease   Congestive Heart Failure   Hypotension    History of Present Illness:    Wendy Leon is a 89 y.o. female with a hx of CAD with 60% proximal LAD stenosis treated medically due to comorbidities hypertensive heart  disease with heart failure orthostatic hypotension hyperlipidemia statin intolerance stage III CKD and diabetes mellitus type 2 last seen 07/29/2022.  She was admitted to [facility] 08/25/2022 with diplopia dysarthria and word finding difficulty MRI showed no evidence of stroke carotid duplex had no stenosis echocardiogram showed normal left ventricular ejection fraction she resolved her neurologic symptoms and it was unclear whether this was TIA or encephalopathy.  I independently reviewed her EKG 08/24/2022 sinus rhythm left bundle branch block.  Recent labs with her PCP 08/29/2022 hemoglobin 12.8 platelets 370,000 08/06/2022 LDL 107 cholesterol 197 HDL 67 non-HDL cholesterol 130.  Compliance with diet, lifestyle and medications: Yes  Her primary concern is she has episodes where she feels her heart is too slow she might faint she gets up and walks around and the episodes pass. She shows me her continuous glucose monitor at time she is in the high 60s but she is not having severe symptomatic hypoglycemia Is having no anginal discomfort edema shortness of breath palpitation or syncope Recent echocardiogram showed normal left ventricular ejection fraction and no significant valvular abnormality Past Medical History:  Diagnosis Date   Abnormal gait 06/07/2009   Last Assessment & Plan:  She saw specialist who has given her orders for PT and she is wanting to have it setup for her at [facility] where she already is receiving care from at home   Acid reflux 02/20/2014   Aftercare following surgery 11/27/2015   Anemia of unknown etiology 10/18/2016   Anxiety  02/20/2014   Atrial fibrillation (HCC)    CAD (coronary artery disease)    Cerebral ischemia 03/11/2016   Overview:  Microvascular ischemia seen on MRI 01/2015 with partial empty sella turcica neurology appt 03/12/2016 pending with [facility], also [facility] with PT coming to the house for her gait abnormality and lumbar DDD, as well as  local ortho (durranni)    Chest pain 11/03/2018   CHF (congestive heart failure) (HCC) 06/24/2015   Last Assessment & Plan:  Relevant Hx: Course: Daily Update: Today's Plan:she has been doing well overall from her heart and no SOB  Electronically signed by: Krystal Clark, NP 06/26/15 1522   Chronic diastolic heart failure (HCC) 06/24/2015   Last Assessment & Plan:  Relevant Hx: Course: Daily Update: Today's Plan:she has been doing well overall from her heart and no SOB  Electronically signed by: Krystal Clark, NP 06/26/15 1522   Chronic UTI 06/27/2015   Last Assessment & Plan:  She requested a UA be checked for her as she was having more frequency than before and it has been awhile since she was on oral abx for this, she is on low dose maintenance med to prevent and has done well with that.   CKD stage 3 due to type 2 diabetes mellitus (HCC) 02/24/2018   Colon polyps    COPD (chronic obstructive pulmonary disease) (HCC) 03/09/2012   Last Assessment & Plan:  She feels she is stable from this discussed with her the allergies she has and she is going to discuss with Dr. Blenda Nicely having allergy injections as well for this    Diabetes (HCC) 02/20/2014   Dizziness 02/07/2016   Overview:  Chronic and has been having falls, MRI showing chronic microvascular ischemia, as well as partial sella turcica pending neuro appt for her with [facility]   Dyslipidemia 07/29/2015   Edema 06/24/2015   Last Assessment & Plan:  Relevant Hx: Course: Daily Update: Today's Plan:this is stable for her overall and will follow for her  Electronically signed by: Krystal Clark, NP 06/27/15 0813   Elevated cholesterol    Essential hypertension 03/09/2012   Last Assessment & Plan:  On repeat this was stable for her and she does not check it at home much but when has been checked has been fine for her   Fistula 02/20/2014   Gastroesophageal reflux disease without esophagitis 02/20/2014   Last Assessment &  Plan:  Relevant Hx: Course: Daily Update: Today's Plan:not on meds for this as she has had issues with her arms cramping and she is going to just drink buttermilk  Electronically signed by: Krystal Clark, NP 06/26/15 1530   Generalized abdominal pain 10/24/2016   Hallux valgus of left foot 12/04/2015   Hallux valgus of right foot 12/04/2015   High risk medication use 06/24/2015   History of recurrent UTIs 06/15/2019   Hx of valvular heart disease 01/29/2016   Hypertension 02/20/2014   Hypertensive heart disease with heart failure (HCC) 03/09/2012   Last Assessment & Plan:  On repeat this was stable for her and she does not check it at home much but when has been checked has been fine for her   Hypothyroidism (acquired) 03/09/2012   Last Assessment & Plan:  She was due for this to be updated and have advised her to get this drawn while here today   IBS (irritable bowel syndrome) 06/24/2015   Last Assessment & Plan:  Relevant Hx: Course: Daily Update: Today's  Plan:this is stable for her  Electronically signed by: Krystal Clark, NP 06/26/15 1531   IBS (irritable bowel syndrome)    Idiopathic progressive polyneuropathy 06/07/2009   Left bundle branch block 06/24/2015   Leukocytosis 06/24/2015   Last Assessment & Plan:  Relevant Hx: Course: Daily Update: Today's Plan:this has been stable for her with FU through hematology who felt she was having benign swings in this and she chose to not have bone marrow biopsy done   Electronically signed by: Krystal Clark, NP 06/27/15 [phone]   Liposarcoma, 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 Sharion Dove [facility]), 8 x 6 x 3 cm. Local recurrence, re-excision 03/23/2007 Thedore Mins), 5 x 3 x 2 cm. Surveillance plan: baseline MRI April 2014  Last Assessment & Plan:  Relevant Hx: Course: Daily Update: Today's Plan:she is stable from this  Electronically signed by: Krystal Clark, NP 06/26/15 1533   Lumbar degenerative disc disease 11/06/2015   Last Assessment & Plan:  At this point she needs updated MRI of her lumbar spine and we discussed with her gait abnormality she is likely going to have to see specialist at [facility] for this, she has seen local neuro for this several years ago in terms of her neuropathy and her falls and he really did not address it . She was advised several years ago to have back surgery locally which we did not feel was ideal with her sugars at the time being so out of control and now she is really more in control than then and it may be doable for her if felt  Needed though she would have to have cardiology clear her as well.   Mild CAD 07/29/2015   Overview:  Overview:  Cath march 2016 with 60% LAD with normal FFR   Overview:  Cath march 2016 with 60% LAD with normal FFR    Mixed hyperlipidemia 06/24/2015   Last Assessment & Plan:  Relevant Hx: Course: Daily Update: Today's Plan:update her lipids for her today and she is trying to watch her diet   Electronically signed by: Krystal Clark, NP 06/27/15 0813   Obstructive sleep apnea syndrome 06/24/2015   Onychomycosis due to dermatophyte 11/27/2015   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   Last Assessment & Plan:  Relevant Hx: Course: Daily Update: Today's Plan:she is doing well overall  Electronically signed by: Krystal Clark, NP 06/26/15 1531   Pancreatitis    Pre-ulcerative calluses 12/04/2015   Pre-ulcerative corn or callous 11/27/2015   Primary osteoarthritis involving multiple joints 07/06/2015   Last Assessment & Plan:  Relevant Hx: Course: Daily Update: Today's Plan:she has DDD of her back with some spinal  Stenosis that she has seen ortho locally for and he wanted to do surgery which she has avoided and I agree with, she is going to use the flexeril for this right now as she is having more muscle spasm than what she has had in the past from her attempt to walk down some very  steep steps that she was not used to . She should use her walker for support through the weekend and avoid overhead strain and lifting and stay in touch if not better.  Electronically signed by: Krystal Clark, NP 07/06/15 0939   Seasonal allergic rhinitis 01/12/2019   Sleep apnea    Stress incontinence of urine 04/17/2016   Syncope and collapse 10/13/2016   Thyroid disease    TIA (transient ischemic  attack) 10/13/2016   Type 2 diabetes mellitus without complication, without long-term current use of insulin (HCC) 03/09/2012   Last Assessment & Plan:  She brings in readings of her sugars which show she has been doing much better overall with this at home and quite pleased with her   UTI (urinary tract infection)    Vascular calcification 01/29/2016   Visual changes 01/29/2016   Overview:  Following with Dr. Marina Gravel as well as had recent carotid doppler showing minimal plaque present and vertebral arteries normal , nothing signficant   Vitamin B12 deficiency 12/04/2015    Past Surgical History:  Procedure Laterality Date   ABDOMINAL HYSTERECTOMY     CHOLECYSTECTOMY     EYE SURGERY     PANCREAS SURGERY     skin cancer lesion      Current Medications: Current Meds  Medication Sig   albuterol (PROVENTIL) (2.5 MG/3ML) 0.083% nebulizer solution Take 2.5 mg by nebulization every 4 (four) hours as needed for wheezing or shortness of breath.   albuterol (VENTOLIN HFA) 108 (90 Base) MCG/ACT inhaler Inhale 2 puffs into the lungs every 4 (four) hours as needed for wheezing or shortness of breath.   Alirocumab (PRALUENT) 150 MG/ML SOAJ Inject 1 mL into the skin every 14 (fourteen) days.   ALPRAZolam (XANAX) 0.5 MG tablet Take 0.5 mg by mouth 3 (three) times daily as needed for anxiety.    amLODipine (NORVASC) 5 MG tablet Take 2.5 mg by mouth daily.    aspirin EC 81 MG tablet Take 81 mg by mouth daily.   benazepril (LOTENSIN) 20 MG tablet Take 20 mg by mouth daily.   Blood Glucose Monitoring  Suppl (ACCU-CHEK AVIVA PLUS) w/Device KIT See admin instructions.   celecoxib (CELEBREX) 200 MG capsule Take 200 mg by mouth daily as needed for mild pain or moderate pain.   cephALEXin (KEFLEX) 250 MG capsule Take 250 mg by mouth daily.   cyanocobalamin (,VITAMIN B-12,) 1000 MCG/ML injection Inject into the muscle every 30 (thirty) days.   donepezil (ARICEPT) 5 MG tablet Take 5 mg by mouth at bedtime.   fluticasone (FLONASE) 50 MCG/ACT nasal spray Place 1 spray into the nose 2 (two) times daily.   furosemide (LASIX) 40 MG tablet Take 1 tablet (40 mg total) by mouth 2 (two) times daily.   gabapentin (NEURONTIN) 300 MG capsule Take 300 mg by mouth daily.   glimepiride (AMARYL) 1 MG tablet Take 0.5 tablets by mouth in the morning and at bedtime.    insulin glargine (LANTUS) 100 UNIT/ML Solostar Pen Inject 10-40 Units into the skin at bedtime. Sliding scale   isosorbide mononitrate (IMDUR) 30 MG 24 hr tablet Take 1 tablet (30 mg total) by mouth daily.   levothyroxine (SYNTHROID, LEVOTHROID) 75 MCG tablet Take 75 mcg by mouth daily.   loratadine (CLARITIN) 10 MG tablet Take 10 mg by mouth daily as needed for allergies.   metFORMIN (GLUCOPHAGE-XR) 500 MG 24 hr tablet Take 500 mg by mouth 2 (two) times daily.   metoprolol tartrate (LOPRESSOR) 50 MG tablet Take 50 mg by mouth 2 (two) times daily.   montelukast (SINGULAIR) 10 MG tablet Take 10 mg by mouth at bedtime.   NITRO-BID 2 % ointment Apply 1 inch topically at bedtime.   nitroGLYCERIN (NITROSTAT) 0.4 MG SL tablet DISSOLVE 1 TABLET UNDER THE  TONGUE EVERY 5 MINUTES AS NEEDED FOR CHEST PAIN. MAX OF 3 TABLETS IN 15 MINUTES. CALL 911 IF PAIN  PERSISTS. (Patient taking differently: Place 0.4  mg under the tongue every 5 (five) minutes as needed for chest pain.)   nystatin (MYCOSTATIN/NYSTOP) powder Apply 1 Application topically 2 (two) times daily.   nystatin cream (MYCOSTATIN) Apply 1 Application topically 2 (two) times daily.   SYMBICORT 160-4.5  MCG/ACT inhaler Inhale 2 puffs into the lungs 2 (two) times daily.   traMADol (ULTRAM) 50 MG tablet Take 50 mg by mouth every 6 (six) hours as needed for moderate pain.     Allergies:   Doxycycline, Nitrofurantoin, Statins, Sulfamethoxazole, and Trimethoprim   Social History   Socioeconomic History   Marital status: Widowed    Spouse name: Not on file   Number of children: Not on file   Years of education: Not on file   Highest education level: Not on file  Occupational History   Not on file  Tobacco Use   Smoking status: Never    Passive exposure: Past   Smokeless tobacco: Current    Types: Snuff  Vaping Use   Vaping Use: Never used  Substance and Sexual Activity   Alcohol use: No   Drug use: No   Sexual activity: Not on file  Other Topics Concern   Not on file  Social History Narrative   Not on file   Social Determinants of Health   Financial Resource Strain: Not on file  Food Insecurity: No Food Insecurity (08/24/2022)   Hunger Vital Sign    Worried About Running Out of Food in the Last Year: Never true    Ran Out of Food in the Last Year: Never true  Transportation Needs: No Transportation Needs (08/24/2022)   PRAPARE - Administrator, Civil Service (Medical): No    Lack of Transportation (Non-Medical): No  Physical Activity: Not on file  Stress: Not on file  Social Connections: Not on file     Family History: The patient's family history includes Cancer in her sister; Diabetes in her mother; Hypertension in her father and mother. ROS:   Please see the history of present illness.    All other systems reviewed and are negative.  EKGs/Labs/Other Studies Reviewed:    The following studies were reviewed today:  Cardiac Studies & Procedures       ECHOCARDIOGRAM  ECHOCARDIOGRAM COMPLETE 08/25/2022  Narrative ECHOCARDIOGRAM REPORT    Patient Name:   Wendy Leon Date of Exam: 08/24/2022 Medical Rec #:  [id number]    Height:       64.0  in Accession #:    [id number]   Weight:       159.0 lb Date of Birth:  February 01, 1933     BSA:          1.775 m Patient Age:    89 years     BP:           119/47 mmHg Patient Gender: F            HR:           64 bpm. Exam Location:  Inpatient  Procedure: 2D Echo, Cardiac Doppler and Color Doppler  Indications:    TIA  History:        Patient has no prior history of Echocardiogram examinations. CHF, COPD and TIA; Risk Factors:Hypertension and Diabetes.  Sonographer:    Wallie Char Referring Phys: ERIC CHEN  IMPRESSIONS   1. Left ventricular ejection fraction, by estimation, is 60 to 65%. The left ventricle has normal function. The left ventricle has no regional wall motion abnormalities. There  is mild left ventricular hypertrophy. Left ventricular diastolic parameters are consistent with Grade I diastolic dysfunction (impaired relaxation). 2. Right ventricular systolic function is normal. The right ventricular size is normal. 3. The mitral valve is normal in structure. Trivial mitral valve regurgitation. No evidence of mitral stenosis. 4. The aortic valve is tricuspid. Aortic valve regurgitation is not visualized. Aortic valve sclerosis is present, with no evidence of aortic valve stenosis. 5. The inferior vena cava is normal in size with greater than 50% respiratory variability, suggesting right atrial pressure of 3 mmHg.  Comparison(s): No prior Echocardiogram.  FINDINGS Left Ventricle: Left ventricular ejection fraction, by estimation, is 60 to 65%. The left ventricle has normal function. The left ventricle has no regional wall motion abnormalities. The left ventricular internal cavity size was normal in size. There is mild left ventricular hypertrophy. Left ventricular diastolic parameters are consistent with Grade I diastolic dysfunction (impaired relaxation).  Right Ventricle: The right ventricular size is normal. Right ventricular systolic function is normal.  Left Atrium:  Left atrial size was normal in size.  Right Atrium: Right atrial size was normal in size.  Pericardium: There is no evidence of pericardial effusion.  Mitral Valve: The mitral valve is normal in structure. Mild mitral annular calcification. Trivial mitral valve regurgitation. No evidence of mitral valve stenosis. MV peak gradient, 4.5 mmHg. The mean mitral valve gradient is 2.0 mmHg.  Tricuspid Valve: The tricuspid valve is normal in structure. Tricuspid valve regurgitation is mild . No evidence of tricuspid stenosis.  Aortic Valve: The aortic valve is tricuspid. Aortic valve regurgitation is not visualized. Aortic valve sclerosis is present, with no evidence of aortic valve stenosis. Aortic valve mean gradient measures 4.0 mmHg. Aortic valve peak gradient measures 5.9 mmHg. Aortic valve area, by VTI measures 1.86 cm.  Pulmonic Valve: The pulmonic valve was normal in structure. Pulmonic valve regurgitation is trivial. No evidence of pulmonic stenosis.  Aorta: The aortic root is normal in size and structure.  Venous: The inferior vena cava is normal in size with greater than 50% respiratory variability, suggesting right atrial pressure of 3 mmHg.  IAS/Shunts: No atrial level shunt detected by color flow Doppler.   LEFT VENTRICLE PLAX 2D LVIDd:         3.80 cm     Diastology LVIDs:         2.80 cm     LV e' medial:    5.34 cm/s LV PW:         1.10 cm     LV E/e' medial:  11.0 LV IVS:        1.20 cm     LV e' lateral:   5.18 cm/s LVOT diam:     1.60 cm     LV E/e' lateral: 11.3 LV SV:         51 LV SV Index:   29 LVOT Area:     2.01 cm  LV Volumes (MOD) LV vol d, MOD A2C: 62.4 ml LV vol d, MOD A4C: 68.3 ml LV vol s, MOD A2C: 28.9 ml LV vol s, MOD A4C: 32.4 ml LV SV MOD A2C:     33.5 ml LV SV MOD A4C:     68.3 ml LV SV MOD BP:      36.6 ml  RIGHT VENTRICLE             IVC RV Basal diam:  3.20 cm     IVC diam: 1.70 cm RV S prime:  13.80 cm/s TAPSE (M-mode): 1.9 cm  LEFT  ATRIUM             Index        RIGHT ATRIUM           Index LA diam:        3.10 cm 1.75 cm/m   RA Area:     13.60 cm LA Vol (A2C):   40.4 ml 22.77 ml/m  RA Volume:   29.20 ml  16.45 ml/m LA Vol (A4C):   40.2 ml 22.65 ml/m LA Biplane Vol: 40.2 ml 22.65 ml/m AORTIC VALVE AV Area (Vmax):    1.82 cm AV Area (Vmean):   1.76 cm AV Area (VTI):     1.86 cm AV Vmax:           121.00 cm/s AV Vmean:          89.300 cm/s AV VTI:            0.274 m AV Peak Grad:      5.9 mmHg AV Mean Grad:      4.0 mmHg LVOT Vmax:         109.50 cm/s LVOT Vmean:        78.100 cm/s LVOT VTI:          0.254 m LVOT/AV VTI ratio: 0.93  AORTA Ao Root diam: 2.90 cm Ao Asc diam:  3.50 cm  MITRAL VALVE                TRICUSPID VALVE MV Area (PHT): 1.77 cm     TR Peak grad:   33.4 mmHg MV Area VTI:   1.75 cm     TR Vmax:        289.00 cm/s MV Peak grad:  4.5 mmHg MV Mean grad:  2.0 mmHg     SHUNTS MV Vmax:       1.06 m/s     Systemic VTI:  0.25 m MV Vmean:      64.6 cm/s    Systemic Diam: 1.60 cm MV Decel Time: 429 msec MV E velocity: 58.60 cm/s MV A velocity: 108.00 cm/s MV E/A ratio:  0.54  Olga Millers MD Electronically signed by Olga Millers MD Signature Date/Time: 08/25/2022/10:13:06 AM    Final     CT SCANS  CT CORONARY MORPH W/CTA COR W/SCORE 11/22/2018  Addendum 11/22/2018  4:45 PM ADDENDUM REPORT: 11/22/2018 16:43  CLINICAL DATA:  Chest pain  EXAM: Cardiac CTA  MEDICATIONS: Sub lingual nitro. 4mg  x 2  TECHNIQUE: The patient was scanned on a Siemens 192 slice scanner. Gantry rotation speed was 250 msecs. Collimation was 0.6 mm. A 100 kV prospective scan was triggered in the ascending thoracic aorta at 35-75% of the R-R interval. Average HR during the scan was 60 bpm. The 3D data set was interpreted on a dedicated work station using MPR, MIP and VRT modes. A total of 80cc of contrast was used.  FINDINGS: Non-cardiac: See separate report from [facility]  Radiology.  Pulmonary veins drain normally to the left atrium.  Calcium Score: 738 Agatston units.  Coronary Arteries: Right dominant with no anomalies  LM: Calcified plaque distal left main, no significant stenosis.  LAD system: Calcified plaque proximal and mid LAD, suspect no more than mild (<50%) stenosis.  Circumflex system: Calcified plaque in the proximal LCx, mild (<50%) stenosis.  RCA system: Mixed plaque in the proximal RCA with mild (<50%) stenosis. Calcified plaque mid RCA, suspect no more than 50% stenosis but  difficult given blooming artifact.  IMPRESSION: 1. Coronary artery calcium score 738 Agatston units. This places the patient in the 82nd percentile for age and gender.  2.  Suspect nonobstructive CAD but will send for FFR to confirm.  Dalton Mclean   Electronically Signed By: Marca Ancona M.D. On: 11/22/2018 16:43  Narrative EXAM: OVER-READ INTERPRETATION  CT CHEST  The following report is an over-read performed by radiologist Dr. Kennith Center of [facility] on 11/22/2018. This over-read does not include interpretation of cardiac or coronary anatomy or pathology. The coronary CTA interpretation by the cardiologist is attached.  COMPARISON:  05/18/2017  FINDINGS: Vascular: There is abdominal aortic atherosclerosis without aneurysm.  Mediastinum/Nodes: No lymphadenopathy within the visualized mediastinum. No lymphadenopathy within the visualized hilar regions.  Lungs/Pleura: 6 mm peripheral right lower lobe nodule (19/12) is stable in the interval, compatible with benign etiology. Small subpleural nodules in the right upper lobe (images 2, 3/12) are unchanged. Nodularity posterior to the major fissure, left lower lobe on 01/12 is stable. No evidence for pleural effusion.  Upper Abdomen: Unremarkable.  Musculoskeletal: No worrisome lytic or sclerotic osseous abnormality.  IMPRESSION: 1. Small bilateral pulmonary nodules  stable since 05/18/2017 consistent with benign etiology. 2.  Aortic Atherosclerois (ICD10-170.0)  Electronically Signed: By: Kennith Center M.D. On: 11/22/2018 16:26           Recent Labs: 08/24/2022: ALT 22; BUN 28; Creatinine, Ser 1.23; Hemoglobin 12.4; Magnesium 2.0; Platelets 413; Potassium 3.4; Sodium 134; TSH 3.595  Recent Lipid Panel    Component Value Date/Time   CHOL 190 08/24/2022 0231   CHOL 178 07/13/2020 0946   TRIG 87 08/24/2022 0231   HDL 66 08/24/2022 0231   HDL 66 07/13/2020 0946   CHOLHDL 2.9 08/24/2022 0231   VLDL 17 08/24/2022 0231   LDLCALC 107 (H) 08/24/2022 0231   LDLCALC 89 07/13/2020 0946    Physical Exam:    VS:  BP (!) 162/85   Pulse 68   Ht 5\' 4"  (1.626 m)   Wt 161 lb 3.2 oz (73.1 kg)   SpO2 97%   BMI 27.67 kg/m     Wt Readings from Last 3 Encounters:  09/15/22 161 lb 3.2 oz (73.1 kg)  08/23/22 159 lb (72.1 kg)  07/29/22 160 lb 6.4 oz (72.8 kg)     GEN:  Well nourished, well developed in no acute distress HEENT: Normal NECK: No JVD; No carotid bruits LYMPHATICS: No lymphadenopathy CARDIAC: RRR, no murmurs, rubs, gallops RESPIRATORY:  Clear to auscultation without rales, wheezing or rhonchi  ABDOMEN: Soft, non-tender, non-distended MUSCULOSKELETAL:  No edema; No deformity  SKIN: Warm and dry NEUROLOGIC:  Alert and oriented x 3 PSYCHIATRIC:  Normal affect    Signed, Norman Herrlich, MD  09/15/2022 2:26 PM    [facility]

## 2022-09-15 ENCOUNTER — Ambulatory Visit: Payer: Medicare Other | Attending: Cardiology | Admitting: Cardiology

## 2022-09-15 ENCOUNTER — Encounter: Payer: Self-pay | Admitting: Cardiology

## 2022-09-15 ENCOUNTER — Ambulatory Visit (INDEPENDENT_AMBULATORY_CARE_PROVIDER_SITE_OTHER): Payer: Medicare Other

## 2022-09-15 VITALS — BP 146/70 | HR 68 | Ht 64.0 in | Wt 161.2 lb

## 2022-09-15 DIAGNOSIS — I25118 Atherosclerotic heart disease of native coronary artery with other forms of angina pectoris: Secondary | ICD-10-CM | POA: Diagnosis not present

## 2022-09-15 DIAGNOSIS — N183 Chronic kidney disease, stage 3 unspecified: Secondary | ICD-10-CM

## 2022-09-15 DIAGNOSIS — R001 Bradycardia, unspecified: Secondary | ICD-10-CM

## 2022-09-15 DIAGNOSIS — E782 Mixed hyperlipidemia: Secondary | ICD-10-CM

## 2022-09-15 DIAGNOSIS — I110 Hypertensive heart disease with heart failure: Secondary | ICD-10-CM

## 2022-09-15 DIAGNOSIS — G903 Multi-system degeneration of the autonomic nervous system: Secondary | ICD-10-CM

## 2022-09-15 DIAGNOSIS — I447 Left bundle-branch block, unspecified: Secondary | ICD-10-CM | POA: Diagnosis not present

## 2022-09-15 DIAGNOSIS — E1122 Type 2 diabetes mellitus with diabetic chronic kidney disease: Secondary | ICD-10-CM | POA: Diagnosis not present

## 2022-09-15 DIAGNOSIS — Z789 Other specified health status: Secondary | ICD-10-CM

## 2022-09-15 DIAGNOSIS — Z794 Long term (current) use of insulin: Secondary | ICD-10-CM

## 2022-09-15 DIAGNOSIS — Z7984 Long term (current) use of oral hypoglycemic drugs: Secondary | ICD-10-CM

## 2022-09-15 NOTE — Patient Instructions (Signed)
Medication Instructions:  Your physician recommends that you continue on your current medications as directed. Please refer to the Current Medication list given to you today.  *If you need a refill on your cardiac medications before your next appointment, please call your pharmacy*   Lab Work: None If you have labs (blood work) drawn today and your tests are completely normal, you will receive your results only by: MyChart Message (if you have MyChart) OR A paper copy in the mail If you have any lab test that is abnormal or we need to change your treatment, we will call you to review the results.   Testing/Procedures: A zio monitor was ordered today. It will remain on for 14 days. You will then return monitor and event diary in provided box. It takes 1-2 weeks for report to be downloaded and returned to [place]. We will call you with the results. If monitor falls off or has orange flashing light, please call [employer] for further instructions.     Follow-Up: At [facility], you and your health needs are our priority.  As part of our continuing mission to provide you with exceptional heart care, we have created designated Provider Care Teams.  These Care Teams include your primary Cardiologist (physician) and Advanced Practice Providers (APPs -  Physician Assistants and Nurse Practitioners) who all work together to provide you with the care you need, when you need it.  We recommend signing up for the patient portal called "MyChart".  Sign up information is provided on this After Visit Summary.  MyChart is used to connect with patients for Virtual Visits (Telemedicine).  Patients are able to view lab/test results, encounter notes, upcoming appointments, etc.  Non-urgent messages can be sent to your provider as well.   To learn more about what you can do with MyChart, go to [email or web address].    Your next appointment:   6 month(s)  Provider:   Norman Herrlich, MD    Other  Instructions Get a new blood pressure device.  Omron/ upper arm

## 2022-09-17 ENCOUNTER — Ambulatory Visit: Payer: Medicare Other | Admitting: Cardiology

## 2022-11-20 ENCOUNTER — Inpatient Hospital Stay: Payer: Medicare Other | Admitting: Neurology

## 2023-02-05 ENCOUNTER — Other Ambulatory Visit: Payer: Self-pay

## 2023-02-05 MED ORDER — NITRO-BID 2 % TD OINT: 1.0000 [in_us] | TOPICAL_OINTMENT | Freq: Every evening | TRANSDERMAL | 2 refills | Status: DC

## 2023-06-01 ENCOUNTER — Encounter: Payer: Self-pay | Admitting: Cardiology

## 2023-06-01 NOTE — Progress Notes (Signed)
 Cardiology Office Note:    Date:  06/02/2023   ID:  Wendy Leon, DOB June 19, 1932, MRN [id number]  PCP:  Thurmond Cathlyn LABOR., MD  Cardiologist:  Redell Leiter, MD    Referring MD: Thurmond Cathlyn LABOR., MD    ASSESSMENT:    1. Coronary artery disease of native artery of native heart with stable angina pectoris (HCC)   2. Hypertensive heart disease with heart failure (HCC)   3. CKD stage 3 due to type 2 diabetes mellitus (HCC)   4. Neurogenic orthostatic hypotension (HCC)   5. Mixed hyperlipidemia   6. Statin intolerance   7. Left bundle branch block    PLAN:    In order of problems listed above:  Wendy Leon is doing much better with her single-vessel CAD since adjustment of medications with nighttime nitrates no further episodes of chest pain She will continue treatment including aspirin  oral nitrate beta-blocker and topical nitroglycerin  applied at bedtime Stable kidney function recent labs Stable no recurrent episodes of symptomatic orthostatic hypotension Good response lipids at target continue PCSK9 inhibitor she is statin intolerant Stable EKG last visit   Next appointment: 1 year as she has done so well   Medication Adjustments/Labs and Tests Ordered: Current medicines are reviewed at length with the patient today.  Concerns regarding medicines are outlined above.  No orders of the defined types were placed in this encounter.  No orders of the defined types were placed in this encounter.    History of Present Illness:    Wendy Leon is a 89 y.o. female with a hx of complex heart disease including CAD with moderate proximal mLAD stenosis treated medically due to comorbidities hypertensive heart disease with heart failure symptomatic orthostatic hypotension with syncope hyperlipidemia statin intolerance diabetes mellitus type 2 and stage III CKD last seen 09/15/2022.  Recent labs 2 months ago CMP with a sodium 136 potassium 4.9 creatinine 1.11 GFR 48 cc/min Lipid profile cholesterol  181 HDL 69 LDL 89 non-HDL cholesterol 112 CBC with a hemoglobin of 12.8 and platelets normal 431,000  Compliance with diet, lifestyle and medications: Yes  Her daughter primary caregiver is present and participates in the evaluation and decision making Since we initiated nighttime nitroglycerin  paste and moved her Imdur  to the evening she has had no further anginal discomfort Previously had severe orthostatic hypotension no recent episodes She is pleased with the quality of her life She is not having edema shortness of breath angina palpitation or syncope She is on nonstatin lipid-lowering treatment Past Medical History:  Diagnosis Date   Abnormal gait 06/07/2009   Last Assessment & Plan:  She saw specialist who has given her orders for PT and she is wanting to have it setup for her at [facility] where she already is receiving care from at home   Acid reflux 02/20/2014   Aftercare following surgery 11/27/2015   Anemia of unknown etiology 10/18/2016   Anxiety 02/20/2014   Atrial fibrillation (HCC)    Bilateral bunions 11/15/2020   CAD (coronary artery disease)    Carotid aneurysm, right (HCC) 09/11/2022   2mm. observe [facility] 2024     Cerebral ischemia 03/11/2016   Overview:  Microvascular ischemia seen on MRI 01/2015 with partial empty sella turcica neurology appt 03/12/2016 pending with [facility], also [facility] with PT coming to the house for her gait abnormality and lumbar DDD, as well as local ortho (durranni)    Chest pain 11/03/2018   CHF (congestive heart failure) (HCC) 06/24/2015  Last Assessment & Plan:  Relevant Hx: Course: Daily Update: Today's Plan:she has been doing well overall from her heart and no SOB  Electronically signed by: Eleanor Merlynn Lady, NP 06/26/15 1522   Chronic diastolic heart failure (HCC) 06/24/2015   Last Assessment & Plan:  Relevant Hx: Course: Daily Update: Today's Plan:she has been doing well overall from her heart and no SOB  Electronically  signed by: Eleanor Merlynn Lady, NP 06/26/15 1522   Chronic low back pain 02/03/2022   Chronic UTI 06/27/2015   Last Assessment & Plan:  She requested a UA be checked for her as she was having more frequency than before and it has been awhile since she was on oral abx for this, she is on low dose maintenance med to prevent and has done well with that.   CKD stage 3 due to type 2 diabetes mellitus (HCC) 02/24/2018   CKD stage 3a, GFR 45-59 ml/min (HCC) 02/24/2018   Colon polyps    COPD (chronic obstructive pulmonary disease) (HCC) 03/09/2012   Last Assessment & Plan:  She feels she is stable from this discussed with her the allergies she has and she is going to discuss with Dr. Mardee having allergy injections as well for this    Diabetes (HCC) 02/20/2014   Dizziness 02/07/2016   Overview:  Chronic and has been having falls, MRI showing chronic microvascular ischemia, as well as partial sella turcica pending neuro appt for her with [facility]   Dyslipidemia 07/29/2015   Edema 06/24/2015   Last Assessment & Plan:  Relevant Hx: Course: Daily Update: Today's Plan:this is stable for her overall and will follow for her  Electronically signed by: Eleanor Merlynn Lady, NP 06/27/15 0813   Elevated cholesterol    Essential hypertension 03/09/2012   Last Assessment & Plan:  On repeat this was stable for her and she does not check it at home much but when has been checked has been fine for her   Fistula 02/20/2014   Gastroesophageal reflux disease without esophagitis 02/20/2014   Last Assessment & Plan:  Relevant Hx: Course: Daily Update: Today's Plan:not on meds for this as she has had issues with her arms cramping and she is going to just drink buttermilk  Electronically signed by: Eleanor Merlynn Lady, NP 06/26/15 1530   Generalized abdominal pain 10/24/2016   Hallux valgus of left foot 12/04/2015   Hallux valgus of right foot 12/04/2015   High risk medication use 06/24/2015   History of  cancer 03/09/2012   Initial excision 03/18/2000 (Dr. Darrow Stank [facility]), 8 x 6   x 3 cm.  Local recurrence, re-excision 03/23/2007 Jil), 5 x 3 x 2 cm.  Surveillance plan: baseline MRI April 2014     History of COVID-19 12/16/2019   11/2019 - Quarantined at home     History of recurrent UTIs 06/15/2019   Hx of valvular heart disease 01/29/2016   Hypertension 02/20/2014   Hypertensive heart disease with heart failure (HCC) 03/09/2012   Last Assessment & Plan:  On repeat this was stable for her and she does not check it at home much but when has been checked has been fine for her   Hypokalemia 06/21/2020   Hyponatremia 08/23/2022   Hypothyroidism (acquired) 03/09/2012   Last Assessment & Plan:  She was due for this to be updated and have advised her to get this drawn while here today   IBS (irritable bowel syndrome) 06/24/2015   Last Assessment & Plan:  Relevant Hx: Course: Daily Update: Today's Plan:this is stable for her  Electronically signed by: Eleanor Merlynn Lady, NP 06/26/15 1531   IBS (irritable bowel syndrome)    Idiopathic progressive polyneuropathy 06/07/2009   Iron deficiency anemia 02/20/2014   Late onset Alzheimer's dementia without behavioral disturbance (HCC) 10/06/2019   Left bundle branch block 06/24/2015   Leukocytosis 06/24/2015   Last Assessment & Plan:  Relevant Hx: Course: Daily Update: Today's Plan:this has been stable for her with FU through hematology who felt she was having benign swings in this and she chose to not have bone marrow biopsy done   Electronically signed by: Eleanor Merlynn Lady, NP 06/27/15 [phone]   Liposarcoma, 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 Darrow Stank [facility]), 8 x 6 x 3 cm. Local recurrence, re-excision 03/23/2007 J[facility]), 5 x 3 x 2 cm. Surveillance plan: baseline MRI April 2014  Last Assessment & Plan:  Relevant Hx: Course: Daily Update: Today's Plan:she  is stable from this  Electronically signed by: Eleanor Merlynn Lady, NP 06/26/15 1533   Lumbar degenerative disc disease 11/06/2015   Last Assessment & Plan:  At this point she needs updated MRI of her lumbar spine and we discussed with her gait abnormality she is likely going to have to see specialist at [facility] for this, she has seen local neuro for this several years ago in terms of her neuropathy and her falls and he really did not address it . She was advised several years ago to have back surgery locally which we did not feel was ideal with her sugars at the time being so out of control and now she is really more in control than then and it may be doable for her if felt  Needed though she would have to have cardiology clear her as well.   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   Last Assessment & Plan:  Relevant Hx: Course: Daily Update: Today's Plan:she is doing well overall  Electronically signed by: Eleanor Merlynn Lady, NP 06/26/15 1531   Pancreatitis    Pre-ulcerative calluses 12/04/2015   Pre-ulcerative corn or callous 11/27/2015   Primary osteoarthritis involving multiple joints 07/06/2015   Last Assessment & Plan:  Relevant Hx: Course: Daily Update: Today's Plan:she has DDD of her back with some spinal  Stenosis that she has seen ortho locally for and he wanted to do surgery which she has avoided and I agree with, she is going to use the flexeril  for this right now as she is having more muscle spasm  than what she has had in the past from her attempt to walk down some very steep steps that she was not used to . She should use her walker for support through the weekend and avoid overhead strain and lifting and stay in touch if not better.  Electronically signed by: Eleanor Merlynn Lady, NP 07/06/15 [phone]   Primary osteoarthritis of left hip 03/20/2020   Seasonal  allergic rhinitis 01/12/2019   Sleep apnea    Spinal stenosis of lumbar region with neurogenic claudication 06/20/2020   Added automatically from request for surgery [id number]     Stress incontinence of urine 04/17/2016   Syncope and collapse 10/13/2016   Thyroid  disease    TIA (transient ischemic attack) 10/13/2016   Type 2 diabetes mellitus without complication, without long-term current use of insulin  (HCC) 03/09/2012   Last Assessment & Plan:  She brings in readings of her sugars which show she has been doing much better overall with this at home and quite pleased with her   UTI (urinary tract infection)    Vascular calcification 01/29/2016   Visual changes 01/29/2016   Overview:  Following with Dr. Hugh as well as had recent carotid doppler showing minimal plaque present and vertebral arteries normal , nothing signficant   Vitamin B12 deficiency 12/04/2015    Current Medications: Current Meds  Medication Sig   Accu-Chek FastClix Lancets MISC 1 Lancet by Other route 3 (three) times daily.   albuterol (PROVENTIL) (2.5 MG/3ML) 0.083% nebulizer solution Take 2.5 mg by nebulization every 4 (four) hours as needed for wheezing or shortness of breath.   albuterol (VENTOLIN HFA) 108 (90 Base) MCG/ACT inhaler Inhale 2 puffs into the lungs every 4 (four) hours as needed for wheezing or shortness of breath.   Alirocumab  (PRALUENT ) 150 MG/ML SOAJ Inject 1 mL into the skin every 14 (fourteen) days.   ALPRAZolam  (XANAX ) 0.5 MG tablet Take 0.5 mg by mouth 3 (three) times daily as needed for anxiety.    amLODipine  (NORVASC ) 5 MG  tablet Take 2.5 mg by mouth daily.    aspirin  EC 81 MG tablet Take 81 mg by mouth daily.   BD PEN NEEDLE MICRO U/F 32G X 6 MM MISC 10-12 Units by Other route at bedtime.   benazepril  (LOTENSIN ) 20 MG tablet Take 20 mg by mouth daily.   Blood Glucose Monitoring Suppl (ACCU-CHEK AVIVA PLUS) w/Device KIT See admin instructions.   celecoxib (CELEBREX) 200 MG capsule Take 200 mg by mouth daily as needed for mild pain or moderate pain.   cephALEXin  (KEFLEX ) 250 MG capsule Take 250 mg by mouth daily.   Continuous Glucose Receiver (DEXCOM G7 RECEIVER) DEVI 1 Device by Other route as needed.   Continuous Glucose Sensor (DEXCOM G7 SENSOR) MISC 1 Device by Other route daily.   cyanocobalamin  (,VITAMIN B-12,) 1000 MCG/ML injection Inject into the muscle every 30 (thirty) days.   donepezil  (ARICEPT ) 5 MG tablet Take 5 mg by mouth at bedtime.   fluticasone  (FLONASE ) 50 MCG/ACT nasal spray Place 1 spray into the nose 2 (two) times daily.   furosemide  (LASIX ) 40 MG tablet Take 1 tablet (40 mg total) by mouth 2 (two) times daily.   gabapentin  (NEURONTIN ) 300 MG capsule Take 300 mg by mouth daily.   glimepiride  (AMARYL ) 1 MG tablet Take 0.5 tablets by mouth in the morning and at bedtime.    insulin  glargine (LANTUS ) 100 UNIT/ML Solostar Pen Inject 10-40 Units into the skin at bedtime. Sliding scale   isosorbide  mononitrate (IMDUR ) 30 MG 24 hr tablet Take 1 tablet (30 mg total) by mouth daily.   levothyroxine  (SYNTHROID , LEVOTHROID) 75 MCG tablet Take 75 mcg by mouth daily.   loratadine  (CLARITIN ) 10 MG tablet Take 10 mg by mouth daily as needed for allergies.   metFORMIN  (GLUCOPHAGE -XR) 500 MG 24 hr tablet Take 500 mg by mouth 2 (two) times daily.   metoprolol  tartrate (LOPRESSOR ) 50  MG tablet Take 50 mg by mouth 2 (two) times daily.   montelukast  (SINGULAIR ) 10 MG tablet Take 10 mg by mouth at bedtime.   NITRO-BID  2 % ointment Apply 1 inch topically at bedtime.   nitroGLYCERIN  (NITROSTAT ) 0.4 MG SL tablet  DISSOLVE 1 TABLET UNDER THE  TONGUE EVERY 5 MINUTES AS NEEDED FOR CHEST PAIN. MAX OF 3 TABLETS IN 15 MINUTES. CALL 911 IF PAIN  PERSISTS. (Patient taking differently: Place 0.4 mg under the tongue every 5 (five) minutes as needed for chest pain.)   nystatin (MYCOSTATIN/NYSTOP) powder Apply 1 Application topically 2 (two) times daily.   nystatin cream (MYCOSTATIN) Apply 1 Application topically 2 (two) times daily.   SYMBICORT  160-4.5 MCG/ACT inhaler Inhale 2 puffs into the lungs 2 (two) times daily.   traMADol  (ULTRAM ) 50 MG tablet Take 50 mg by mouth every 6 (six) hours as needed for moderate pain.      EKGs/Labs/Other Studies Reviewed:    The following studies were reviewed today:  Cardiac Studies & Procedures      ECHOCARDIOGRAM  ECHOCARDIOGRAM COMPLETE 08/24/2022  Narrative ECHOCARDIOGRAM REPORT    Patient Name:   Wendy Leon Date of Exam: 08/24/2022 Medical Rec #:  [id number]    Height:       64.0 in Accession #:    [id number]   Weight:       159.0 lb Date of Birth:  [birth year]     BSA:          1.775 m Patient Age:    89 years     BP:           119/47 mmHg Patient Gender: F            HR:           64 bpm. Exam Location:  Inpatient  Procedure: 2D Echo, Cardiac Doppler and Color Doppler  Indications:    TIA  History:        Patient has no prior history of Echocardiogram examinations. CHF, COPD and TIA; Risk Factors:Hypertension and Diabetes.  Sonographer:    Geofm Doss Referring Phys: ERIC CHEN  IMPRESSIONS   1. Left ventricular ejection fraction, by estimation, is 60 to 65%. The left ventricle has normal function. The left ventricle has no regional wall motion abnormalities. There is mild left ventricular hypertrophy. Left ventricular diastolic parameters are consistent with Grade I diastolic dysfunction (impaired relaxation). 2. Right ventricular systolic function is normal. The right ventricular size is normal. 3. The mitral valve is normal in structure. Trivial  mitral valve regurgitation. No evidence of mitral stenosis. 4. The aortic valve is tricuspid. Aortic valve regurgitation is not visualized. Aortic valve sclerosis is present, with no evidence of aortic valve stenosis. 5. The inferior vena cava is normal in size with greater than 50% respiratory variability, suggesting right atrial pressure of 3 mmHg.  Comparison(s): No prior Echocardiogram.  FINDINGS Left Ventricle: Left ventricular ejection fraction, by estimation, is 60 to 65%. The left ventricle has normal function. The left ventricle has no regional wall motion abnormalities. The left ventricular internal cavity size was normal in size. There is mild left ventricular hypertrophy. Left ventricular diastolic parameters are consistent with Grade I diastolic dysfunction (impaired relaxation).  Right Ventricle: The right ventricular size is normal. Right ventricular systolic function is normal.  Left Atrium: Left atrial size was normal in size.  Right Atrium: Right atrial size was normal in size.  Pericardium: There is no evidence of pericardial effusion.  Mitral Valve: The mitral valve is normal in structure. Mild mitral annular calcification. Trivial mitral valve regurgitation. No evidence of mitral valve stenosis. MV peak gradient, 4.5 mmHg. The mean mitral valve gradient is 2.0 mmHg.  Tricuspid Valve: The tricuspid valve is normal in structure. Tricuspid valve regurgitation is mild . No evidence of tricuspid stenosis.  Aortic Valve: The aortic valve is tricuspid. Aortic valve regurgitation is not visualized. Aortic valve sclerosis is present, with no evidence of aortic valve stenosis. Aortic valve mean gradient measures 4.0 mmHg. Aortic valve peak gradient measures 5.9 mmHg. Aortic valve area, by VTI measures 1.86 cm.  Pulmonic Valve: The pulmonic valve was normal in structure. Pulmonic valve regurgitation is trivial. No evidence of pulmonic stenosis.  Aorta: The aortic root is normal  in size and structure.  Venous: The inferior vena cava is normal in size with greater than 50% respiratory variability, suggesting right atrial pressure of 3 mmHg.  IAS/Shunts: No atrial level shunt detected by color flow Doppler.   LEFT VENTRICLE PLAX 2D LVIDd:         3.80 cm     Diastology LVIDs:         2.80 cm     LV e' medial:    5.34 cm/s LV PW:         1.10 cm     LV E/e' medial:  11.0 LV IVS:        1.20 cm     LV e' lateral:   5.18 cm/s LVOT diam:     1.60 cm     LV E/e' lateral: 11.3 LV SV:         51 LV SV Index:   29 LVOT Area:     2.01 cm  LV Volumes (MOD) LV vol d, MOD A2C: 62.4 ml LV vol d, MOD A4C: 68.3 ml LV vol s, MOD A2C: 28.9 ml LV vol s, MOD A4C: 32.4 ml LV SV MOD A2C:     33.5 ml LV SV MOD A4C:     68.3 ml LV SV MOD BP:      36.6 ml  RIGHT VENTRICLE             IVC RV Basal diam:  3.20 cm     IVC diam: 1.70 cm RV S prime:     13.80 cm/s TAPSE (M-mode): 1.9 cm  LEFT ATRIUM             Index        RIGHT ATRIUM           Index LA diam:        3.10 cm 1.75 cm/m   RA Area:     13.60 cm LA Vol (A2C):   40.4 ml 22.77 ml/m  RA Volume:   29.20 ml  16.45 ml/m LA Vol (A4C):   40.2 ml 22.65 ml/m LA Biplane Vol: 40.2 ml 22.65 ml/m AORTIC VALVE AV Area (Vmax):    1.82 cm AV Area (Vmean):   1.76 cm AV Area (VTI):     1.86 cm AV Vmax:           121.00 cm/s AV Vmean:          89.300 cm/s AV VTI:            0.274 m AV Peak Grad:      5.9 mmHg AV Mean Grad:      4.0 mmHg LVOT Vmax:         109.50 cm/s  LVOT Vmean:        78.100 cm/s LVOT VTI:          0.254 m LVOT/AV VTI ratio: 0.93  AORTA Ao Root diam: 2.90 cm Ao Asc diam:  3.50 cm  MITRAL VALVE                TRICUSPID VALVE MV Area (PHT): 1.77 cm     TR Peak grad:   33.4 mmHg MV Area VTI:   1.75 cm     TR Vmax:        289.00 cm/s MV Peak grad:  4.5 mmHg MV Mean grad:  2.0 mmHg     SHUNTS MV Vmax:       1.06 m/s     Systemic VTI:  0.25 m MV Vmean:      64.6 cm/s    Systemic Diam: 1.60  cm MV Decel Time: 429 msec MV E velocity: 58.60 cm/s MV A velocity: 108.00 cm/s MV E/A ratio:  0.54  Redell Shallow MD Electronically signed by Redell Shallow MD Signature Date/Time: 08/25/2022/10:13:06 AM    Final   MONITORS  LONG TERM MONITOR (3-14 DAYS) 10/03/2022  Narrative Patch Wear Time:  13 days and 20 hours (2024-05-20T14:43:59-0400 to 2024-06-03T11:16:09-398)  Patient had a min HR of 52 bpm, max HR of 133 bpm, and avg HR of 66 bpm. Predominant underlying rhythm was Sinus Rhythm. Bundle Branch Block/IVCD was present.  There were 9 triggered and 7 diary events all sinus rhythm 1 had brief atrial fibrillation and several had frequent PVCs and APCs.  There were no pauses of 3 seconds or greater and no episodes of second or third-degree AV nodal block.  Atrial Fibrillation occurred (<1% burden), ranging from 73-133 bpm (avg of 93 bpm), the longest episode lasting 7 mins 19 secs with an avg rate of 89 bpm. Atrial Fibrillation was detected within +/- 45 seconds of symptomatic patient event(s).  Isolated SVEs were rare (<1.0%), SVE Couplets were rare (<1.0%), and SVE Triplets were rare (<1.0%).  Isolated VEs were rare (<1.0%), and no VE Couplets or VE Triplets were present. Ventricular Bigeminy was present.  His monitor was performed because of concerns with bradycardia none is seen.  With the paucity of atrial fibrillation her history of orthostatic hypotension falls and risk-benefit ratio I would not initiate anticoagulation.  CT SCANS  CT CORONARY MORPH W/CTA COR W/SCORE 11/22/2018  Addendum 11/22/2018  4:45 PM ADDENDUM REPORT: 11/22/2018 16:43  CLINICAL DATA:  Chest pain  EXAM: Cardiac CTA  MEDICATIONS: Sub lingual nitro. 4mg  x 2  TECHNIQUE: The patient was scanned on a Siemens 192 slice scanner. Gantry rotation speed was 250 msecs. Collimation was 0.6 mm. A 100 kV prospective scan was triggered in the ascending thoracic aorta at 35-75% of the R-R interval.  Average HR during the scan was 60 bpm. The 3D data set was interpreted on a dedicated work station using MPR, MIP and VRT modes. A total of 80cc of contrast was used.  FINDINGS: Non-cardiac: See separate report from [facility].  Pulmonary veins drain normally to the left atrium.  Calcium Score: 738 Agatston units.  Coronary Arteries: Right dominant with no anomalies  LM: Calcified plaque distal left main, no significant stenosis.  LAD system: Calcified plaque proximal and mid LAD, suspect no more than mild (<50%) stenosis.  Circumflex system: Calcified plaque in the proximal LCx, mild (<50%) stenosis.  RCA system: Mixed plaque in the proximal RCA with mild (<50%) stenosis. Calcified plaque mid RCA,  suspect no more than 50% stenosis but difficult given blooming artifact.  IMPRESSION: 1. Coronary artery calcium score 738 Agatston units. This places the patient in the 82nd percentile for age and gender.  2.  Suspect nonobstructive CAD but will send for FFR to confirm.  Dalton Mclean   Electronically Signed By: Ezra Shuck M.D. On: 11/22/2018 16:43  Narrative EXAM: OVER-READ INTERPRETATION  CT CHEST  The following report is an over-read performed by radiologist Dr. Camellia Candle of [facility] on 11/22/2018. This over-read does not include interpretation of cardiac or coronary anatomy or pathology. The coronary CTA interpretation by the cardiologist is attached.  COMPARISON:  05/18/2017  FINDINGS: Vascular: There is abdominal aortic atherosclerosis without aneurysm.  Mediastinum/Nodes: No lymphadenopathy within the visualized mediastinum. No lymphadenopathy within the visualized hilar regions.  Lungs/Pleura: 6 mm peripheral right lower lobe nodule (19/12) is stable in the interval, compatible with benign etiology. Small subpleural nodules in the right upper lobe (images 2, 3/12) are unchanged. Nodularity posterior to the major fissure,  left lower lobe on 01/12 is stable. No evidence for pleural effusion.  Upper Abdomen: Unremarkable.  Musculoskeletal: No worrisome lytic or sclerotic osseous abnormality.  IMPRESSION: 1. Small bilateral pulmonary nodules stable since 05/18/2017 consistent with benign etiology. 2.  Aortic Atherosclerois (ICD10-170.0)  Electronically Signed: By: Camellia Candle M.D. On: 11/22/2018 16:26   CT CORONARY FRACTIONAL FLOW RESERVE DATA PREP 11/22/2018  Narrative CLINICAL DATA:  Chest pain  EXAM: CT FFR  MEDICATIONS: No additional medications.  TECHNIQUE: The coronary CTA was sent for FFR.  FINDINGS: FFR 0.66 mid to distal LAD  FFR 0.89 mid RCA  FFR 0.87 mid LCx  IMPRESSION: This suggests the presence of a hemodynamically significant proximal LAD stenosis. Visually, there was calcified plaque throughout the proximal and mid LAD that did not appear obstructive. Query low FFR as cumulative effect of extensive mild-moderate plaque but if symptomatic would consider catheterization for more close assessment.  Dalton Mclean   Electronically Signed By: Ezra Shuck M.D. On: 11/23/2018 07:59                Physical Exam:    VS:  BP (!) 122/58   Pulse 65   Ht 5' 4 (1.626 m)   Wt 158 lb 6.4 oz (71.8 kg)   SpO2 95%   BMI 27.19 kg/m     Wt Readings from Last 3 Encounters:  06/02/23 158 lb 6.4 oz (71.8 kg)  09/15/22 161 lb 3.2 oz (73.1 kg)  08/23/22 159 lb (72.1 kg)     GEN:  Well nourished, well developed in no acute distress HEENT: Normal NECK: No JVD; No carotid bruits LYMPHATICS: No lymphadenopathy CARDIAC: RRR, no murmurs, rubs, gallops RESPIRATORY:  Clear to auscultation without rales, wheezing or rhonchi  ABDOMEN: Soft, non-tender, non-distended MUSCULOSKELETAL:  No edema; No deformity  SKIN: Warm and dry NEUROLOGIC:  Alert and oriented x 3 PSYCHIATRIC:  Normal affect    Signed, Redell Leiter, MD  06/02/2023 2:32 PM    [facility]

## 2023-06-02 ENCOUNTER — Ambulatory Visit: Payer: Medicare Other | Attending: Cardiology | Admitting: Cardiology

## 2023-06-02 ENCOUNTER — Encounter: Payer: Self-pay | Admitting: Cardiology

## 2023-06-02 VITALS — BP 122/58 | HR 65 | Ht 64.0 in | Wt 158.4 lb

## 2023-06-02 DIAGNOSIS — I110 Hypertensive heart disease with heart failure: Secondary | ICD-10-CM

## 2023-06-02 DIAGNOSIS — E1122 Type 2 diabetes mellitus with diabetic chronic kidney disease: Secondary | ICD-10-CM

## 2023-06-02 DIAGNOSIS — N183 Chronic kidney disease, stage 3 unspecified: Secondary | ICD-10-CM

## 2023-06-02 DIAGNOSIS — Z789 Other specified health status: Secondary | ICD-10-CM

## 2023-06-02 DIAGNOSIS — I25118 Atherosclerotic heart disease of native coronary artery with other forms of angina pectoris: Secondary | ICD-10-CM

## 2023-06-02 DIAGNOSIS — E782 Mixed hyperlipidemia: Secondary | ICD-10-CM

## 2023-06-02 DIAGNOSIS — I447 Left bundle-branch block, unspecified: Secondary | ICD-10-CM

## 2023-06-02 DIAGNOSIS — G903 Multi-system degeneration of the autonomic nervous system: Secondary | ICD-10-CM

## 2023-06-02 NOTE — Patient Instructions (Signed)

## 2023-07-28 ENCOUNTER — Other Ambulatory Visit (HOSPITAL_COMMUNITY): Payer: Self-pay

## 2023-07-30 ENCOUNTER — Telehealth: Payer: Self-pay | Admitting: Pharmacy Technician

## 2023-07-30 ENCOUNTER — Other Ambulatory Visit (HOSPITAL_COMMUNITY): Payer: Self-pay

## 2023-07-30 NOTE — Telephone Encounter (Signed)
 Pharmacy Patient Advocate Encounter   Received notification from CoverMyMeds that prior authorization for PRALUENT is required/requested.   Insurance verification completed.   The patient is insured through [employer] .   Per test claim: PA required; PA submitted to above mentioned insurance via CoverMyMeds Key/confirmation #/EOC [id number] Status is pending

## 2023-07-31 NOTE — Telephone Encounter (Signed)
 Pharmacy Patient Advocate Encounter  Received notification from [facility] that Prior Authorization for praluent has been APPROVED from 07/30/23 to 04/27/2024   PA #/Case ID/Reference #: [id number]    Pt just got 07/27/23

## 2023-08-16 ENCOUNTER — Other Ambulatory Visit: Payer: Self-pay | Admitting: Cardiology

## 2023-08-17 NOTE — Telephone Encounter (Signed)
 Prescription sent to pharmacy.

## 2023-11-20 ENCOUNTER — Other Ambulatory Visit: Payer: Self-pay | Admitting: Cardiology

## 2023-11-24 ENCOUNTER — Telehealth: Payer: Self-pay | Admitting: Cardiology

## 2023-11-24 ENCOUNTER — Other Ambulatory Visit: Payer: Self-pay

## 2023-11-24 NOTE — Telephone Encounter (Signed)
 Pt c/o swelling: STAT is pt has developed SOB within 24 hours  How much weight have you gained and in what time span? no  If swelling, where is the swelling located? Feet   Are you currently taking a fluid pill? yes  Are you currently SOB? sometimes  Do you have a log of your daily weights (if so, list)?    Have you gained 3 pounds in a day or 5 pounds in a week?  no  Have you traveled recently? no   Left arm went numb

## 2023-11-24 NOTE — Telephone Encounter (Signed)
 Called the patient and spoke with the patient's daughter Velia. Velia reported that the patient had swelling of her feet this past week for 2 days. She stopped giving her mother the Amlodipine  because she was concerned that the Amlodipine  was causing the increased retention of fluid in her feet. After two days the swelling in the patient's feet was gone. This past Saturday she also reported that her mother had an episode where her left arm became weak and her blood pressure was elevated. She does not remember what the blood pressure was. Currently her blood pressure has come back down to normal and the weakness in her left arm has resolved. Please advise.

## 2023-11-24 NOTE — Telephone Encounter (Signed)
 Called the patient and informed her to follow up with her PCP regarding the episode of weakness in her left arm. Patient was also instructed to go to the ER if an episode of weakness happened again to be evaluated. I explained that the ER has medicine that could reverse her symptoms but there is a time frame in which they can give those medications to the patient and this is why it is important to go to the ER at the first sign of those same symptoms. Patient verbalized understanding and had no further questions at this time.

## 2023-12-10 NOTE — Progress Notes (Deleted)
  Cardiology Office Note   Date:  12/10/2023  ID:  Wendy Leon, DOB [birth year], MRN [id number] PCP: Thurmond Cathlyn LABOR., MD  [facility] Providers Cardiologist:  Redell Leiter, MD Cardiology APP:  Carlin Delon BROCKS, NP { Click to update primary MD,subspecialty MD or APP then REFRESH:1}    History of Present Illness Wendy Leon is a 89 y.o. female of paroxysmal atrial fibrillation not on anticoagulant, CAD, HFpEF, dyslipidemia with statin intolerance, hypertension, history of pancreatic cancer  09/15/2022 monitor average heart rate 66 bpm, predominant rhythm was sinus BBB and IVCD were present, 9 triggered events associated with brief atrial fibrillation, frequent PVCs 08/24/2022 echo EF 60 to 65%, mild LVH, grade 1 DD 08/24/2022 carotid duplex extracranial vessels were near normal 11/22/2018 coronary CTA calcium score of 738, appeared to be nonobstructive CAD however FFR was hemodynamically significant from mid LAD to distal LAD  She is a longstanding patient of Dr. Leiter and initially established with him in 2017 further evaluation of coronary artery disease.  In 2020 she had a coronary CTA revealing a calcium score of 738, FFR was borderline however she was asymptomatic and she was started on antianginal agents.  She wore a monitor in 2024 revealing brief episodes of atrial fibrillation, less than 1% however with her propensity to falls the decision was made to not start her on anticoagulant.  Most recently evaluated by Dr. Leiter on 06/02/2023, stable from a cardiac perspective, her lipids were at goal, no changes were made and she has been advised to follow-up in 1 year.  With the paucity of atrial fibrillation her history of orthostatic hypotension falls and risk-benefit ratio I would not initiate anticoagulation.   158 >> ROS: ROS   Studies Reviewed      *** Risk Assessment/Calculations {Does this patient have ATRIAL FIBRILLATION?[phone]} No BP recorded.  {Refresh Note OR Click  here to enter BP  :1}***       Physical Exam VS:  There were no vitals taken for this visit.       Wt Readings from Last 3 Encounters:  06/02/23 158 lb 6.4 oz (71.8 kg)  09/15/22 161 lb 3.2 oz (73.1 kg)  08/23/22 159 lb (72.1 kg)    GEN: Well nourished, well developed in no acute distress NECK: No JVD; No carotid bruits CARDIAC: ***RRR, no murmurs, rubs, gallops RESPIRATORY:  Clear to auscultation without rales, wheezing or rhonchi  ABDOMEN: Soft, non-tender, non-distended EXTREMITIES:  No edema; No deformity   ASSESSMENT AND PLAN PAF -      {Are you ordering a CV Procedure (e.g. stress test, cath, DCCV, TEE, etc)?   Press F2        :[id number]}  Dispo: ***  Signed, Delon BROCKS Carlin, NP

## 2023-12-11 ENCOUNTER — Ambulatory Visit: Admitting: Cardiology

## 2023-12-21 NOTE — Progress Notes (Unsigned)
 Cardiology Office Note   Date:  12/24/2023  ID:  REGINALD MANGELS, DOB [birth year], MRN [id number] PCP: Thurmond Cathlyn LABOR., MD  [facility] Providers Cardiologist:  Redell Leiter, MD Cardiology APP:  Carlin Delon BROCKS, NP     History of Present Illness EMMALYN HINSON is a 89 y.o. female of paroxysmal atrial fibrillation not on anticoagulant, CAD, HFpEF, LBBB, dyslipidemia with statin intolerance, hypertension, history of pancreatic cancer  09/15/2022 monitor average heart rate 66 bpm, predominant rhythm was sinus BBB and IVCD were present, 9 triggered events associated with brief atrial fibrillation, frequent PVCs 08/24/2022 echo EF 60 to 65%, mild LVH, grade 1 DD 08/24/2022 carotid duplex extracranial vessels were near normal 11/22/2018 coronary CTA calcium score of 738, appeared to be nonobstructive CAD however FFR was hemodynamically significant from mid LAD to distal LAD  She is a longstanding patient of Dr. Leiter and initially established with him in 2017 further evaluation of coronary artery disease.  In 2020 she had a coronary CTA revealing a calcium score of 738, FFR was borderline however she was asymptomatic and she was started on antianginal agents.  She wore a monitor in 2024 revealing brief episodes of atrial fibrillation, less than 1% however with her propensity to falls the decision was made to not start her on anticoagulant.  Most recently evaluated by Dr. Leiter on 06/02/2023, stable from a cardiac perspective, her lipids were at goal, no changes were made and she has been advised to follow-up in 1 year.  She presents today accompanied by her daughter for concerns of increased lethargy.  She apparently was running a fever a few nights ago and she feels warm in the office today, she has just had low energy and her symptoms seem to be most consistent with a viral process. She denies chest pain, palpitations, dyspnea, pnd, orthopnea, n, v, dizziness, syncope, edema, weight gain, or early  satiety.   ROS: Review of Systems  Constitutional:  Positive for fever and malaise/fatigue.  All other systems reviewed and are negative.    Studies Reviewed EKG Interpretation Date/Time:  Tuesday December 22 2023 14:38:11 EDT Ventricular Rate:  72 PR Interval:  164 QRS Duration:  134 QT Interval:  436 QTC Calculation: 477 R Axis:   17  Text Interpretation: Normal sinus rhythm Left bundle branch block Abnormal ECG When compared with ECG of 23-Aug-2022 12:21, PREVIOUS ECG IS PRESENT Confirmed by Carlin Delon [phone]) on 12/22/2023 2:44:03 PM    Cardiac Studies & Procedures   ______________________________________________________________________________________________     ECHOCARDIOGRAM  ECHOCARDIOGRAM COMPLETE 08/24/2022  Narrative ECHOCARDIOGRAM REPORT    Patient Name:   AUSET FRITZLER Date of Exam: 08/24/2022 Medical Rec #:  [id number]    Height:       64.0 in Accession #:    [id number]   Weight:       159.0 lb Date of Birth:  [birth year]     BSA:          1.775 m Patient Age:    89 years     BP:           119/47 mmHg Patient Gender: F            HR:           64 bpm. Exam Location:  Inpatient  Procedure: 2D Echo, Cardiac Doppler and Color Doppler  Indications:    TIA  History:        Patient has no prior history of Echocardiogram examinations.  CHF, COPD and TIA; Risk Factors:Hypertension and Diabetes.  Sonographer:    Geofm Doss Referring Phys: ERIC CHEN  IMPRESSIONS   1. Left ventricular ejection fraction, by estimation, is 60 to 65%. The left ventricle has normal function. The left ventricle has no regional wall motion abnormalities. There is mild left ventricular hypertrophy. Left ventricular diastolic parameters are consistent with Grade I diastolic dysfunction (impaired relaxation). 2. Right ventricular systolic function is normal. The right ventricular size is normal. 3. The mitral valve is normal in structure. Trivial mitral valve regurgitation. No  evidence of mitral stenosis. 4. The aortic valve is tricuspid. Aortic valve regurgitation is not visualized. Aortic valve sclerosis is present, with no evidence of aortic valve stenosis. 5. The inferior vena cava is normal in size with greater than 50% respiratory variability, suggesting right atrial pressure of 3 mmHg.  Comparison(s): No prior Echocardiogram.  FINDINGS Left Ventricle: Left ventricular ejection fraction, by estimation, is 60 to 65%. The left ventricle has normal function. The left ventricle has no regional wall motion abnormalities. The left ventricular internal cavity size was normal in size. There is mild left ventricular hypertrophy. Left ventricular diastolic parameters are consistent with Grade I diastolic dysfunction (impaired relaxation).  Right Ventricle: The right ventricular size is normal. Right ventricular systolic function is normal.  Left Atrium: Left atrial size was normal in size.  Right Atrium: Right atrial size was normal in size.  Pericardium: There is no evidence of pericardial effusion.  Mitral Valve: The mitral valve is normal in structure. Mild mitral annular calcification. Trivial mitral valve regurgitation. No evidence of mitral valve stenosis. MV peak gradient, 4.5 mmHg. The mean mitral valve gradient is 2.0 mmHg.  Tricuspid Valve: The tricuspid valve is normal in structure. Tricuspid valve regurgitation is mild . No evidence of tricuspid stenosis.  Aortic Valve: The aortic valve is tricuspid. Aortic valve regurgitation is not visualized. Aortic valve sclerosis is present, with no evidence of aortic valve stenosis. Aortic valve mean gradient measures 4.0 mmHg. Aortic valve peak gradient measures 5.9 mmHg. Aortic valve area, by VTI measures 1.86 cm.  Pulmonic Valve: The pulmonic valve was normal in structure. Pulmonic valve regurgitation is trivial. No evidence of pulmonic stenosis.  Aorta: The aortic root is normal in size and  structure.  Venous: The inferior vena cava is normal in size with greater than 50% respiratory variability, suggesting right atrial pressure of 3 mmHg.  IAS/Shunts: No atrial level shunt detected by color flow Doppler.   LEFT VENTRICLE PLAX 2D LVIDd:         3.80 cm     Diastology LVIDs:         2.80 cm     LV e' medial:    5.34 cm/s LV PW:         1.10 cm     LV E/e' medial:  11.0 LV IVS:        1.20 cm     LV e' lateral:   5.18 cm/s LVOT diam:     1.60 cm     LV E/e' lateral: 11.3 LV SV:         51 LV SV Index:   29 LVOT Area:     2.01 cm  LV Volumes (MOD) LV vol d, MOD A2C: 62.4 ml LV vol d, MOD A4C: 68.3 ml LV vol s, MOD A2C: 28.9 ml LV vol s, MOD A4C: 32.4 ml LV SV MOD A2C:     33.5 ml LV SV MOD A4C:  68.3 ml LV SV MOD BP:      36.6 ml  RIGHT VENTRICLE             IVC RV Basal diam:  3.20 cm     IVC diam: 1.70 cm RV S prime:     13.80 cm/s TAPSE (M-mode): 1.9 cm  LEFT ATRIUM             Index        RIGHT ATRIUM           Index LA diam:        3.10 cm 1.75 cm/m   RA Area:     13.60 cm LA Vol (A2C):   40.4 ml 22.77 ml/m  RA Volume:   29.20 ml  16.45 ml/m LA Vol (A4C):   40.2 ml 22.65 ml/m LA Biplane Vol: 40.2 ml 22.65 ml/m AORTIC VALVE AV Area (Vmax):    1.82 cm AV Area (Vmean):   1.76 cm AV Area (VTI):     1.86 cm AV Vmax:           121.00 cm/s AV Vmean:          89.300 cm/s AV VTI:            0.274 m AV Peak Grad:      5.9 mmHg AV Mean Grad:      4.0 mmHg LVOT Vmax:         109.50 cm/s LVOT Vmean:        78.100 cm/s LVOT VTI:          0.254 m LVOT/AV VTI ratio: 0.93  AORTA Ao Root diam: 2.90 cm Ao Asc diam:  3.50 cm  MITRAL VALVE                TRICUSPID VALVE MV Area (PHT): 1.77 cm     TR Peak grad:   33.4 mmHg MV Area VTI:   1.75 cm     TR Vmax:        289.00 cm/s MV Peak grad:  4.5 mmHg MV Mean grad:  2.0 mmHg     SHUNTS MV Vmax:       1.06 m/s     Systemic VTI:  0.25 m MV Vmean:      64.6 cm/s    Systemic Diam: 1.60 cm MV Decel  Time: 429 msec MV E velocity: 58.60 cm/s MV A velocity: 108.00 cm/s MV E/A ratio:  0.54  Redell Shallow MD Electronically signed by Redell Shallow MD Signature Date/Time: 08/25/2022/10:13:06 AM    Final    MONITORS  LONG TERM MONITOR (3-14 DAYS) 10/03/2022  Narrative Patch Wear Time:  13 days and 20 hours (2024-05-20T14:43:59-0400 to 2024-06-03T11:16:09-398)  Patient had a min HR of 52 bpm, max HR of 133 bpm, and avg HR of 66 bpm. Predominant underlying rhythm was Sinus Rhythm. Bundle Branch Block/IVCD was present.  There were 9 triggered and 7 diary events all sinus rhythm 1 had brief atrial fibrillation and several had frequent PVCs and APCs.  There were no pauses of 3 seconds or greater and no episodes of second or third-degree AV nodal block.  Atrial Fibrillation occurred (<1% burden), ranging from 73-133 bpm (avg of 93 bpm), the longest episode lasting 7 mins 19 secs with an avg rate of 89 bpm. Atrial Fibrillation was detected within +/- 45 seconds of symptomatic patient event(s).  Isolated SVEs were rare (<1.0%), SVE Couplets were rare (<1.0%), and SVE Triplets were rare (<1.0%).  Isolated  VEs were rare (<1.0%), and no VE Couplets or VE Triplets were present. Ventricular Bigeminy was present.  His monitor was performed because of concerns with bradycardia none is seen.  With the paucity of atrial fibrillation her history of orthostatic hypotension falls and risk-benefit ratio I would not initiate anticoagulation.   CT SCANS  CT CORONARY MORPH W/CTA COR W/SCORE 11/22/2018  Addendum 11/22/2018  4:45 PM ADDENDUM REPORT: 11/22/2018 16:43  CLINICAL DATA:  Chest pain  EXAM: Cardiac CTA  MEDICATIONS: Sub lingual nitro. 4mg  x 2  TECHNIQUE: The patient was scanned on a Siemens 192 slice scanner. Gantry rotation speed was 250 msecs. Collimation was 0.6 mm. A 100 kV prospective scan was triggered in the ascending thoracic aorta at 35-75% of the R-R interval. Average HR  during the scan was 60 bpm. The 3D data set was interpreted on a dedicated work station using MPR, MIP and VRT modes. A total of 80cc of contrast was used.  FINDINGS: Non-cardiac: See separate report from [facility].  Pulmonary veins drain normally to the left atrium.  Calcium Score: 738 Agatston units.  Coronary Arteries: Right dominant with no anomalies  LM: Calcified plaque distal left main, no significant stenosis.  LAD system: Calcified plaque proximal and mid LAD, suspect no more than mild (<50%) stenosis.  Circumflex system: Calcified plaque in the proximal LCx, mild (<50%) stenosis.  RCA system: Mixed plaque in the proximal RCA with mild (<50%) stenosis. Calcified plaque mid RCA, suspect no more than 50% stenosis but difficult given blooming artifact.  IMPRESSION: 1. Coronary artery calcium score 738 Agatston units. This places the patient in the 82nd percentile for age and gender.  2.  Suspect nonobstructive CAD but will send for FFR to confirm.  Dalton Mclean   Electronically Signed By: Ezra Shuck M.D. On: 11/22/2018 16:43  Narrative EXAM: OVER-READ INTERPRETATION  CT CHEST  The following report is an over-read performed by radiologist Dr. Camellia Candle of [facility] on 11/22/2018. This over-read does not include interpretation of cardiac or coronary anatomy or pathology. The coronary CTA interpretation by the cardiologist is attached.  COMPARISON:  05/18/2017  FINDINGS: Vascular: There is abdominal aortic atherosclerosis without aneurysm.  Mediastinum/Nodes: No lymphadenopathy within the visualized mediastinum. No lymphadenopathy within the visualized hilar regions.  Lungs/Pleura: 6 mm peripheral right lower lobe nodule (19/12) is stable in the interval, compatible with benign etiology. Small subpleural nodules in the right upper lobe (images 2, 3/12) are unchanged. Nodularity posterior to the major fissure, left  lower lobe on 01/12 is stable. No evidence for pleural effusion.  Upper Abdomen: Unremarkable.  Musculoskeletal: No worrisome lytic or sclerotic osseous abnormality.  IMPRESSION: 1. Small bilateral pulmonary nodules stable since 05/18/2017 consistent with benign etiology. 2.  Aortic Atherosclerois (ICD10-170.0)  Electronically Signed: By: Camellia Candle M.D. On: 11/22/2018 16:26   CT CORONARY FRACTIONAL FLOW RESERVE DATA PREP 11/22/2018  Narrative CLINICAL DATA:  Chest pain  EXAM: CT FFR  MEDICATIONS: No additional medications.  TECHNIQUE: The coronary CTA was sent for FFR.  FINDINGS: FFR 0.66 mid to distal LAD  FFR 0.89 mid RCA  FFR 0.87 mid LCx  IMPRESSION: This suggests the presence of a hemodynamically significant proximal LAD stenosis. Visually, there was calcified plaque throughout the proximal and mid LAD that did not appear obstructive. Query low FFR as cumulative effect of extensive mild-moderate plaque but if symptomatic would consider catheterization for more close assessment.  Dalton Mclean   Electronically Signed By: Ezra Shuck M.D. On:  11/23/2018 07:59     ______________________________________________________________________________________________      Risk Assessment/Calculations  CHA2DS2-VASc Score = 6   This indicates a 9.7% annual risk of stroke. The patient's score is based upon: CHF History: 1 HTN History: 1 Diabetes History: 0 Stroke History: 0 Vascular Disease History: 1 Age Score: 2 Gender Score: 1         Physical Exam VS:  BP (!) 142/70   Pulse 72   Ht 5' 4 (1.626 m)   Wt 166 lb (75.3 kg)   SpO2 94%   BMI 28.49 kg/m        Wt Readings from Last 3 Encounters:  12/22/23 166 lb (75.3 kg)  06/02/23 158 lb 6.4 oz (71.8 kg)  09/15/22 161 lb 3.2 oz (73.1 kg)    GEN: Well nourished, well developed in no acute distress NECK: No JVD; No carotid bruits CARDIAC: RRR, no murmurs, rubs, gallops RESPIRATORY:   Clear to auscultation without rales, wheezing or rhonchi  ABDOMEN: Soft, non-tender, non-distended EXTREMITIES:  No edema; No deformity   ASSESSMENT AND PLAN PAF -noted on her monitor she previously wore, burden was about 1% however with her frailty and propensity for falls the decision was made not to anticoagulate her.  Continue metoprolol  50 mg twice daily.  CAD-per coronary CTA, FFR was apparently hemodynamically significant however she was asymptomatic and the decision was made for optimization of medications, Stable with no anginal symptoms. No indication for ischemic evaluation.  Continue aspirin  81 mg daily, metoprolol  50 mg twice daily, Imdur  30 mg daily.  HFpEF-NYHA class II, she appears euvolemic, she is fatigued however she appears to have a viral process going on as well.  Continue Lasix  20 mg twice daily.  Hypertension-blood pressure slightly elevated 142/70 however she has a propensity to fall and is somewhat frail, will not make any changes to her medications at this time, continue benazepril  20 mg daily, metoprolol  50 mg twice daily.  DOE-this is likely multifactorial but we will repeat an echocardiogram for any contributory causes.         Dispo: Echo, keep recall with Dr. Monetta.  Signed, Delon JAYSON Hoover, NP

## 2023-12-22 ENCOUNTER — Ambulatory Visit: Attending: Cardiology | Admitting: Cardiology

## 2023-12-22 ENCOUNTER — Encounter: Payer: Self-pay | Admitting: Cardiology

## 2023-12-22 VITALS — BP 142/70 | HR 72 | Ht 64.0 in | Wt 166.0 lb

## 2023-12-22 DIAGNOSIS — R0600 Dyspnea, unspecified: Secondary | ICD-10-CM | POA: Diagnosis not present

## 2023-12-22 DIAGNOSIS — I25118 Atherosclerotic heart disease of native coronary artery with other forms of angina pectoris: Secondary | ICD-10-CM

## 2023-12-22 DIAGNOSIS — I110 Hypertensive heart disease with heart failure: Secondary | ICD-10-CM

## 2023-12-22 DIAGNOSIS — E782 Mixed hyperlipidemia: Secondary | ICD-10-CM | POA: Diagnosis not present

## 2023-12-22 DIAGNOSIS — I480 Paroxysmal atrial fibrillation: Secondary | ICD-10-CM

## 2023-12-22 NOTE — Patient Instructions (Signed)
 Medication Instructions:  Your physician recommends that you continue on your current medications as directed. Please refer to the Current Medication list given to you today.  *If you need a refill on your cardiac medications before your next appointment, please call your pharmacy*  Lab Work: NONE If you have labs (blood work) drawn today and your tests are completely normal, you will receive your results only by: MyChart Message (if you have MyChart) OR A paper copy in the mail If you have any lab test that is abnormal or we need to change your treatment, we will call you to review the results.  Testing/Procedures: Your physician has requested that you have an echocardiogram. Echocardiography is a painless test that uses sound waves to create images of your heart. It provides your doctor with information about the size and shape of your heart and how well your heart's chambers and valves are working. This procedure takes approximately one hour. There are no restrictions for this procedure. Please do NOT wear cologne, perfume, aftershave, or lotions (deodorant is allowed). Please arrive 15 minutes prior to your appointment time.  Please note: We ask at that you not bring children with you during ultrasound (echo/ vascular) testing. Due to room size and safety concerns, children are not allowed in the ultrasound rooms during exams. Our front office staff cannot provide observation of children in our lobby area while testing is being conducted. An adult accompanying a patient to their appointment will only be allowed in the ultrasound room at the discretion of the ultrasound technician under special circumstances. We apologize for any inconvenience.   Follow-Up: At [facility], you and your health needs are our priority.  As part of our continuing mission to provide you with exceptional heart care, our providers are all part of one team.  This team includes your primary Cardiologist  (physician) and Advanced Practice Providers or APPs (Physician Assistants and Nurse Practitioners) who all work together to provide you with the care you need, when you need it.  Your next appointment:  5 Month    Provider:   Redell Leiter, MD    We recommend signing up for the patient portal called MyChart.  Sign up information is provided on this After Visit Summary.  MyChart is used to connect with patients for Virtual Visits (Telemedicine).  Patients are able to view lab/test results, encounter notes, upcoming appointments, etc.  Non-urgent messages can be sent to your provider as well.   To learn more about what you can do with MyChart, go to [email or web address].   Other Instructions

## 2023-12-25 NOTE — Telephone Encounter (Signed)
 Copied from CRM #[id number]. Topic: Clinical Concerns - Medical Question >> Dec 25, 2023 11:48 AM Hunter POUR wrote: Edlin, Ford is calling other request    Include all details related to the request(s) below: Patient called and states that she saw her kidney doctor yesterday in [facility] and was told her Hemoglobin was down to 9.  Her kidney doctor thinks she is losing blood somewhere. Patient states their office was supposed to be contacting her provider to advise, but is wanting to know what to do?  Please contact patient back to advise.  Confirm and type the Best Contact Number below:  Patient/caller contact number: Ronal / [phone]            [] Home  [x] Mobile  [] Work [] Other   [] Okay to leave a voicemail   Medication List:  Current Outpatient Medications:  .  albuterol HFA (PROVENTIL HFA;VENTOLIN HFA;PROAIR HFA) 90 mcg/actuation inhaler, Inhale 2 puffs every 4 (four) hours as needed for wheezing or shortness of breath., Disp: 8.5 g, Rfl: 11 .  alirocumab  (Praluent  Pen) 150 mg/mL pnij injection, Inject 150 mg under the skin every 14 (fourteen) days., Disp: , Rfl:  .  ALPRAZolam  (XANAX ) 0.5 mg tablet, TAKE 1 TABLET BY MOUTH EVERY  NIGHT AS NEEDED FOR SLEEP.  MEDICATION REFILLS REQUIRE  OFFICE VISIT EVERY 6 MONTHS, Disp: 30 tablet, Rfl: 5 .  amLODIPine  (NORVASC ) 5 mg tablet, TAKE 1 TABLET BY MOUTH DAILY (Patient not taking: Reported on 12/24/2023), Disp: 100 tablet, Rfl: 2 .  aspirin  81 mg EC tablet, Take 81 mg by mouth Once Daily., Disp: , Rfl:  .  benazepriL  (LOTENSIN ) 20 mg tablet, TAKE 1 TABLET BY MOUTH DAILY, Disp: 100 tablet, Rfl: 2 .  blood-glucose meter (Accu-Chek Guide Me Glucose Mtr) misc, USE AS DIRECTED, Disp: 1 each, Rfl: 0 .  blood-glucose meter,continuous (Dexcom G7 Receiver) misc, 1 each by miscellaneous route as needed (as needed)., Disp: 1 each, Rfl: 5 .  cephALEXin  (KEFLEX ) 250 mg capsule, Take one a day, Disp: 90 capsule, Rfl: 3 .  cyanocobalamin (VITAMIN B12)  1,000 mcg/mL injection, Inject 1,000 mcg into the muscle every 30 (thirty) days., Disp: , Rfl:  .  Dexcom G7 Sensor, USE AS DIRECTED AS NEEDED, Disp: 10 each, Rfl: 2 .  donepeziL  (ARICEPT ) 10 mg tablet, TAKE 1 TABLET BY MOUTH EVERY  NIGHT, Disp: 100 tablet, Rfl: 2 .  fluticasone  propionate (FLONASE ) 50 mcg/spray nasal spray, 1 spray as needed., Disp: , Rfl:  .  furosemide  (LASIX ) 40 mg tablet, TAKE 1 TABLET BY MOUTH IN THE  MORNING AND 1 TABLET BY MOUTH IN THE EVENING, Disp: 200 tablet, Rfl: 2 .  gabapentin  (NEURONTIN ) 100 mg capsule, May take 1-3 capsules at HS ( start with lower dosing and titrate up to desired relief ), Disp: 270 capsule, Rfl: 3 .  glimepiride  (AMARYL ) 1 mg tablet, TAKE 1 TABLET BY MOUTH TWICE  DAILY, Disp: 200 tablet, Rfl: 2 .  glucose blood (Accu-Chek Guide test strips) test strip, CHECK BLOOD SUGAR 3 TIMES DAILY, Disp: 300 strip, Rfl: 3 .  glucose blood (Blood Glucose Test) test strip, Use as instructed, Disp: 100 each, Rfl: 3 .  insulin  glargine (Lantus  Solostar U-100 Insulin ) 100 unit/mL (3 mL) pen, INJECT SUBCUTANEOUSLY 10 UNITS  AT BEDTIME BUT MAY INCREASE TO  MAXIMUM OF 40 UNITS DAILY BASED  ON BLOOD SUGAR LEVEL AS DIRECTED, Disp: 45 mL, Rfl: 2 .  isosorbide  mononitrate (IMDUR ) 30 mg 24 hr tablet, TAKE 1 TABLET BY MOUTH  DAILY, Disp: 100 tablet, Rfl: 1 .  Lancets (Accu-Chek Fastclix [employer]) misc, Use three times a day, Disp: 100 each, Rfl: 1 .  levothyroxine  (SYNTHROID ) 75 mcg tablet, TAKE 1 TABLET BY MOUTH DAILY AT  6AM, Disp: 100 tablet, Rfl: 2 .  loratadine (CLARITIN) 5 mg/5 mL solution, Take 1 tablet by mouth as needed., Disp: , Rfl:  .  metFORMIN  (GLUCOPHAGE -XR) 500 mg 24 hr tablet, TAKE 1 TABLET BY MOUTH TWICE  DAILY, Disp: 200 tablet, Rfl: 2 .  metoprolol  tartrate (LOPRESSOR ) 50 mg tablet, TAKE 1 TABLET BY MOUTH IN THE  MORNING AND TAKE 1 TABLET BY  MOUTH IN THE EVENING, Disp: 200 tablet, Rfl: 2 .  montelukast  (SINGULAIR ) 10 mg tablet, TAKE 1 TABLET BY MOUTH  NIGHTLY, Disp: 100 tablet, Rfl: 2 .  nitroglycerin  (Nitro-Bid ) 2 % ointment, Place 0.5 inches on the skin nightly., Disp: , Rfl:  .  nitroglycerin  (Nitrostat ) 0.4 mg SL tablet, Place 0.4 mg under the tongue every 5 (five) minutes as needed., Disp: , Rfl:  .  NON FORMULARY, CPAP WITH 2 LITERS OF OXYGEN AT NIGHT, Disp: , Rfl:  .  nystatin (MYCOSTATIN) 100,000 unit/gram cream, May apply bid as needed to affected area, Disp: 90 g, Rfl: 1 .  nystatin (MYCOSTATIN) 100,000 unit/gram powder, May apply bid to affected area as needed, Disp: 90 g, Rfl: 1 .  pantoprazole (PROTONIX) 40 mg EC tablet, Take 1 tablet (40 mg total) by mouth every morning before breakfast., Disp: 90 tablet, Rfl: 3 .  pen needle, diabetic (BD Ultra-Fine Micro Pen Needle) 32 gauge x 1/4 ndle, Inject up to 10-12 units at bedtime. Max daily dose of 20 units, Disp: 100 each, Rfl: 5 .  Symbicort  160-4.5 mcg/actuation inhaler, INHALE 2 INHALATIONS BY MOUTH  TWICE DAILY, Disp: 30.6 g, Rfl: 3 .  traMADoL (ULTRAM) 50 mg tablet, Take 1 tablet (50 mg total) by mouth every 6 (six) hours as needed for moderate pain (4-6)., Disp: 60 tablet, Rfl: 0  Current Facility-Administered Medications:  .  cyanocobalamin (VITAMIN B12) injection 1,000 mcg, 1,000 mcg, intramuscular, Q30 Days, Melissa Joyce Brown Patram, NP, 1,000 mcg at 12/02/23 1425     Medication Request/Refills: Pharmacy Information (if applicable)   [x] Not Applicable       []  Pharmacy listed  Send Medication Request to:                                                 [] Pharmacy not listed (added to pharmacy list in Epic) Send Medication Request to:      Listed Pharmacies: [facility] - [place], [place] - [address] - PHONE: [phone] - FAX: [phone] [facility] - [place], [place] - [address] - PHONE: [phone] - FAX: [phone]

## 2023-12-25 NOTE — Telephone Encounter (Signed)
 [email or web address]

## 2023-12-29 NOTE — Telephone Encounter (Signed)
 Pt wants a referral to Dr. Ezzard ASAP

## 2023-12-29 NOTE — Telephone Encounter (Signed)
 Patients daughter is calling in regard to the missed call. Daughter calling to say patient had bloodwork last week, need call back, hemoglobin down to point 9, need a call in regard to bloodwork.  She has been very cold. Read that she need to be referred to have iron infusion  Call back [phone]

## 2023-12-29 NOTE — Telephone Encounter (Signed)
 Patient saw Darice. Darice has referred this patient to Dr. Trudi office. Velia is aware.

## 2023-12-31 ENCOUNTER — Inpatient Hospital Stay

## 2023-12-31 ENCOUNTER — Other Ambulatory Visit: Payer: Self-pay | Admitting: Hematology and Oncology

## 2023-12-31 ENCOUNTER — Encounter: Payer: Self-pay | Admitting: Hematology and Oncology

## 2023-12-31 ENCOUNTER — Other Ambulatory Visit: Payer: Self-pay

## 2023-12-31 ENCOUNTER — Inpatient Hospital Stay: Attending: Hematology and Oncology | Admitting: Hematology and Oncology

## 2023-12-31 VITALS — BP 151/57 | HR 66 | Temp 98.3°F | Resp 20 | Ht 64.0 in | Wt 165.4 lb

## 2023-12-31 DIAGNOSIS — I4891 Unspecified atrial fibrillation: Secondary | ICD-10-CM | POA: Insufficient documentation

## 2023-12-31 DIAGNOSIS — D649 Anemia, unspecified: Secondary | ICD-10-CM

## 2023-12-31 DIAGNOSIS — F0284 Dementia in other diseases classified elsewhere, unspecified severity, with anxiety: Secondary | ICD-10-CM | POA: Diagnosis not present

## 2023-12-31 DIAGNOSIS — Z7951 Long term (current) use of inhaled steroids: Secondary | ICD-10-CM | POA: Diagnosis not present

## 2023-12-31 DIAGNOSIS — N1832 Chronic kidney disease, stage 3b: Secondary | ICD-10-CM | POA: Insufficient documentation

## 2023-12-31 DIAGNOSIS — I130 Hypertensive heart and chronic kidney disease with heart failure and stage 1 through stage 4 chronic kidney disease, or unspecified chronic kidney disease: Secondary | ICD-10-CM | POA: Insufficient documentation

## 2023-12-31 DIAGNOSIS — E039 Hypothyroidism, unspecified: Secondary | ICD-10-CM | POA: Diagnosis not present

## 2023-12-31 DIAGNOSIS — Z7982 Long term (current) use of aspirin: Secondary | ICD-10-CM | POA: Diagnosis not present

## 2023-12-31 DIAGNOSIS — Z7984 Long term (current) use of oral hypoglycemic drugs: Secondary | ICD-10-CM | POA: Insufficient documentation

## 2023-12-31 DIAGNOSIS — Z79890 Hormone replacement therapy (postmenopausal): Secondary | ICD-10-CM | POA: Diagnosis not present

## 2023-12-31 DIAGNOSIS — I5032 Chronic diastolic (congestive) heart failure: Secondary | ICD-10-CM | POA: Insufficient documentation

## 2023-12-31 DIAGNOSIS — G301 Alzheimer's disease with late onset: Secondary | ICD-10-CM | POA: Insufficient documentation

## 2023-12-31 DIAGNOSIS — G4733 Obstructive sleep apnea (adult) (pediatric): Secondary | ICD-10-CM | POA: Insufficient documentation

## 2023-12-31 DIAGNOSIS — D509 Iron deficiency anemia, unspecified: Secondary | ICD-10-CM | POA: Diagnosis not present

## 2023-12-31 DIAGNOSIS — Z79899 Other long term (current) drug therapy: Secondary | ICD-10-CM | POA: Diagnosis not present

## 2023-12-31 DIAGNOSIS — Z794 Long term (current) use of insulin: Secondary | ICD-10-CM | POA: Insufficient documentation

## 2023-12-31 DIAGNOSIS — E1122 Type 2 diabetes mellitus with diabetic chronic kidney disease: Secondary | ICD-10-CM | POA: Insufficient documentation

## 2023-12-31 LAB — CMP (CANCER CENTER ONLY)
ALT: 18 U/L (ref 0–44)
AST: 28 U/L (ref 15–41)
Albumin: 4.2 g/dL (ref 3.5–5.0)
Alkaline Phosphatase: 61 U/L (ref 38–126)
Anion gap: 17 — ABNORMAL HIGH (ref 5–15)
BUN: 33 mg/dL — ABNORMAL HIGH (ref 8–23)
CO2: 20 mmol/L — ABNORMAL LOW (ref 22–32)
Calcium: 9.4 mg/dL (ref 8.9–10.3)
Chloride: 96 mmol/L — ABNORMAL LOW (ref 98–111)
Creatinine: 1.38 mg/dL — ABNORMAL HIGH (ref 0.44–1.00)
GFR, Estimated: 36 mL/min — ABNORMAL LOW (ref >60)
Glucose, Bld: 122 mg/dL — ABNORMAL HIGH (ref 70–99)
Potassium: 4 mmol/L (ref 3.5–5.1)
Sodium: 132 mmol/L — ABNORMAL LOW (ref 135–145)
Total Bilirubin: <0.2 mg/dL (ref 0.0–1.2)
Total Protein: 7.5 g/dL (ref 6.5–8.1)

## 2023-12-31 LAB — CBC WITH DIFFERENTIAL (CANCER CENTER ONLY)
Abs Immature Granulocytes: 0.11 K/uL — ABNORMAL HIGH (ref 0.00–0.07)
Basophils Absolute: 0.2 K/uL — ABNORMAL HIGH (ref 0.0–0.1)
Basophils Relative: 1 %
Eosinophils Absolute: 0.8 K/uL — ABNORMAL HIGH (ref 0.0–0.5)
Eosinophils Relative: 5 %
HCT: 25.7 % — ABNORMAL LOW (ref 36.0–46.0)
Hemoglobin: 8.3 g/dL — ABNORMAL LOW (ref 12.0–15.0)
Immature Granulocytes: 1 %
Lymphocytes Relative: 29 %
Lymphs Abs: 4.5 K/uL — ABNORMAL HIGH (ref 0.7–4.0)
MCH: 26.9 pg (ref 26.0–34.0)
MCHC: 32.3 g/dL (ref 30.0–36.0)
MCV: 83.4 fL (ref 80.0–100.0)
Monocytes Absolute: 2.2 K/uL — ABNORMAL HIGH (ref 0.1–1.0)
Monocytes Relative: 14 %
Neutro Abs: 8.1 K/uL — ABNORMAL HIGH (ref 1.7–7.7)
Neutrophils Relative %: 50 %
Platelet Count: 672 K/uL — ABNORMAL HIGH (ref 150–400)
RBC: 3.08 MIL/uL — ABNORMAL LOW (ref 3.87–5.11)
RDW: 13.9 % (ref 11.5–15.5)
WBC Count: 15.8 K/uL — ABNORMAL HIGH (ref 4.0–10.5)
nRBC: 0.1 % (ref 0.0–0.2)

## 2023-12-31 LAB — IRON AND TIBC
Iron: 19 ug/dL — ABNORMAL LOW (ref 28–170)
Saturation Ratios: 4 % — ABNORMAL LOW (ref 10.4–31.8)
TIBC: 491 ug/dL — ABNORMAL HIGH (ref 250–450)
UIBC: 473 ug/dL

## 2023-12-31 LAB — FOLATE: Folate: 10.5 ng/mL

## 2023-12-31 LAB — VITAMIN B12: Vitamin B-12: 1157 pg/mL — ABNORMAL HIGH (ref 180–914)

## 2023-12-31 LAB — FERRITIN: Ferritin: 9 ng/mL — ABNORMAL LOW (ref 11–307)

## 2023-12-31 NOTE — Progress Notes (Signed)
 [facility] [address] [place],  [place]  [address] [phone]  Clinic Day:  12/31/2023   Referring physician: Thurmond Cathlyn LABOR., MD  Patient Care Team: Patient Care Team: Thurmond Cathlyn LABOR., MD as PCP - General (Internal Medicine) Monetta Redell PARAS, MD as PCP - Cardiology (Cardiology) Georgina Elgin Isela DOUGLAS, MD as Referring Physician (Orthopedic Surgery) Carlin Delon BROCKS, NP as Nurse Practitioner (Cardiology)   REASON FOR CONSULTATION:  Anemia  HISTORY OF PRESENT ILLNESS:   Wendy Leon is a 89 y.o. female with a history of anemia who is referred in consultation by Cathlyn Thurmond, MD for assessment and management. She has a long history of iron deficient anemia and has received IV iron in the past with Dr. Ezzard. As she does have stage 3b chronic kidney disease, this could be a factor in her anemia. She notes increasing fatigue, shortness of breath, feeling cold, and restlessness. Most recent CBC on 08/28 revealed hemoglobin 9.0. She denies chest pain or cough. She denies issue with bowel or bladder. She denies changes in appetite or weight loss.   Medical, surgical and social history included below.     REVIEW OF SYSTEMS:   Review of Systems  Constitutional:  Positive for fatigue.  HENT:  Negative.    Eyes: Negative.   Respiratory:  Positive for shortness of breath.   Cardiovascular: Negative.   Gastrointestinal: Negative.   Endocrine: Negative.   Genitourinary: Negative.    Musculoskeletal:  Positive for arthralgias and myalgias.  Skin: Negative.   Neurological:  Positive for extremity weakness and light-headedness.  Hematological: Negative.   Psychiatric/Behavioral: Negative.       VITALS:   There were no vitals taken for this visit.  Wt Readings from Last 3 Encounters:  12/22/23 166 lb (75.3 kg)  06/02/23 158 lb 6.4 oz (71.8 kg)  09/15/22 161 lb 3.2 oz (73.1 kg)    There is no height or weight on file to calculate BMI.  Performance status  (ECOG): 2 - Symptomatic, <50% confined to bed  PHYSICAL EXAM:   Physical Exam Constitutional:      Appearance: Normal appearance. She is normal weight.  HENT:     Head: Normocephalic and atraumatic.     Mouth/Throat:     Mouth: Mucous membranes are dry.  Cardiovascular:     Rate and Rhythm: Normal rate and regular rhythm.     Pulses: Normal pulses.     Heart sounds: Normal heart sounds.  Pulmonary:     Effort: Pulmonary effort is normal.     Breath sounds: Normal breath sounds.  Abdominal:     General: Bowel sounds are normal.     Palpations: Abdomen is soft.  Musculoskeletal:        General: Normal range of motion.     Cervical back: Normal range of motion.  Skin:    General: Skin is warm and dry.  Neurological:     General: No focal deficit present.     Mental Status: She is alert and oriented to person, place, and time. Mental status is at baseline.  Psychiatric:        Mood and Affect: Mood normal.        Behavior: Behavior normal.        Thought Content: Thought content normal.        Judgment: Judgment normal.      LABS:      Latest Ref Rng & Units 12/31/2023    2:27 PM  08/24/2022    2:31 AM 08/23/2022   12:42 PM  CBC  WBC 4.0 - 10.5 K/uL 15.8  15.4  14.3   Hemoglobin 12.0 - 15.0 g/dL 8.3  87.5  87.9   Hematocrit 36.0 - 46.0 % 25.7  36.1  36.9   Platelets 150 - 400 K/uL 672  413  391       Latest Ref Rng & Units 12/31/2023    2:27 PM 08/24/2022    2:31 AM 08/23/2022   12:42 PM  CMP  Glucose 70 - 99 mg/dL 877  856  860   BUN 8 - 23 mg/dL 33  28  27   Creatinine 0.44 - 1.00 mg/dL 8.61  8.76  8.90   Sodium 135 - 145 mmol/L 132  134  130   Potassium 3.5 - 5.1 mmol/L 4.0  3.4  3.6   Chloride 98 - 111 mmol/L 96  94  90   CO2 22 - 32 mmol/L 20  26  30    Calcium 8.9 - 10.3 mg/dL 9.4  9.6  9.2   Total Protein 6.5 - 8.1 g/dL 7.5  7.1  7.9   Total Bilirubin 0.0 - 1.2 mg/dL <9.7  0.7  0.6   Alkaline Phos 38 - 126 U/L 61  47  49   AST 15 - 41 U/L 28  21  25    ALT  0 - 44 U/L 18  22  19       No results found for: CEA1, CEA / No results found for: CEA1, CEA No results found for: PSA1 No results found for: CAN199 No results found for: CAN125  No results found for: TOTALPROTELP, ALBUMINELP, A1GS, A2GS, BETS, BETA2SER, GAMS, MSPIKE, SPEI No results found for: TIBC, FERRITIN, IRONPCTSAT No results found for: LDH  STUDIES:   No results found.    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 observe [facility] 2024     Cerebral ischemia 03/11/2016   Overview:  Microvascular ischemia seen on MRI 01/2015 with partial empty sella turcica neurology appt 03/12/2016 pending with [facility], also [facility] with PT coming to the house for her gait abnormality and lumbar DDD, as well as local ortho (durranni)    Chest pain 11/03/2018   CHF (congestive heart failure) (HCC) 06/24/2015   Last Assessment & Plan:  Relevant Hx: Course: Daily Update: Today's Plan:she has been doing well overall from her heart and no SOB  Electronically signed by: Eleanor Merlynn Lady, NP 06/26/15 1522   Chronic diastolic heart failure (HCC) 06/24/2015   Last Assessment & Plan:  Relevant Hx: Course: Daily Update: Today's Plan:she has been doing well overall from her heart and no SOB  Electronically signed by: Eleanor Merlynn Lady, NP 06/26/15 1522   Chronic low back pain 02/03/2022   Chronic UTI 06/27/2015   Last Assessment & Plan:  She requested a UA  be checked for her as she was having  more frequency than before and it has been awhile since she was on oral abx for this, she is on low dose maintenance med to prevent and has done well with that.   CKD stage 3 due to type 2 diabetes mellitus (HCC) 02/24/2018   CKD stage 3a, GFR 45-59 ml/min (HCC) 02/24/2018   Colon polyps    COPD (chronic obstructive pulmonary disease) (HCC) 03/09/2012   Last Assessment & Plan:  She feels she is stable from this discussed with her the allergies she has and she is going to discuss with Dr. Mardee having allergy injections as well for this    Diabetes (HCC) 02/20/2014   Dizziness 02/07/2016   Overview:  Chronic and has been having falls, MRI showing chronic microvascular ischemia, as well as partial sella turcica pending neuro appt for her with [facility]   Dyslipidemia 07/29/2015   Edema 06/24/2015   Last Assessment & Plan:  Relevant Hx: Course: Daily Update: Today's Plan:this is stable for her overall and will follow for her  Electronically signed by: Eleanor Merlynn Lady, NP 06/27/15 0813   Elevated cholesterol    Essential hypertension 03/09/2012   Last Assessment & Plan:  On repeat this was stable for her and she does not check it at home much but when has been checked has been fine for her   Fistula 02/20/2014   Gastroesophageal reflux disease without esophagitis 02/20/2014   Last Assessment & Plan:  Relevant Hx: Course: Daily Update: Today's Plan:not on meds for this as she has had issues with her arms cramping and she is going to just drink buttermilk  Electronically signed by: Eleanor Merlynn Lady, NP 06/26/15 1530   Generalized abdominal pain 10/24/2016   Hallux valgus of left foot 12/04/2015   Hallux valgus of right foot 12/04/2015   High risk medication use 06/24/2015   History of cancer 03/09/2012   Initial excision 03/18/2000 (Dr. Darrow St[facility]), 8 x 6   x 3 cm.  Local recurrence, re-excision 03/23/2007 [facility]), 5 x 3 x 2  cm.  Surveillance plan: baseline MRI April 2014     History of COVID-19 12/16/2019   11/2019 - Quarantined at home     History of recurrent UTIs 06/15/2019   Hx of valvular heart disease 01/29/2016   Hypertension 02/20/2014   Hypertensive heart disease with heart failure (HCC) 03/09/2012   Last Assessment & Plan:  On repeat this was stable for her and she does not check it at home much but when has been checked has been fine for her   Hypokalemia 06/21/2020   Hyponatremia 08/23/2022   Hypothyroidism (acquired) 03/09/2012   Last Assessment & Plan:  She was due for this to be updated and have advised her to get this drawn while here today   IBS (irritable bowel syndrome) 06/24/2015   Last Assessment & Plan:  Relevant Hx: Course: Daily Update: Today's Plan:this is stable for her  Electronically signed by: Eleanor Merlynn Lady, NP 06/26/15 1531   IBS (irritable bowel syndrome)    Idiopathic progressive polyneuropathy 06/07/2009   Iron deficiency anemia 02/20/2014   Late onset Alzheimer's dementia without behavioral disturbance (HCC) 10/06/2019   Left bundle branch block 06/24/2015   Leukocytosis 06/24/2015   Last Assessment & Plan:  Relevant Hx: Course: Daily Update: Today's Plan:this has been stable for her with FU through hematology who felt she was having benign swings in this and she chose to not have bone marrow biopsy done   Electronically signed  by: Eleanor Merlynn Lady, NP 06/27/15 [phone]   Liposarcoma, 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 Darrow Stank [facility]), 8 x 6 x 3 cm. Local recurrence, re-excision 03/23/2007 [facility]), 5 x 3 x 2 cm. Surveillance plan: baseline MRI April 2014  Last Assessment & Plan:  Relevant Hx: Course: Daily Update: Today's Plan:she is stable from this  Electronically signed by: Eleanor Merlynn Lady, NP 06/26/15 1533   Lumbar degenerative disc disease 11/06/2015   Last Assessment & Plan:   At this point she needs updated MRI of her lumbar spine and we discussed with her gait abnormality she is likely going to have to see specialist at [facility] for this, she has seen local neuro for this several years ago in terms of her neuropathy and her falls and he really did not address it . She was advised several years ago to have back surgery locally which we did not feel was ideal with her sugars at the time being so out of control and now she is really more in control than then and it may be doable for her if felt  Needed though she would have to have cardiology clear her as well.   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   Last Assessment & Plan:  Relevant Hx: Course: Daily Update: Today's Plan:she is doing well overall  Electronically signed by: Eleanor Merlynn Lady, NP 06/26/15 1531   Pancreatitis    Pre-ulcerative calluses 12/04/2015   Pre-ulcerative corn or callous 11/27/2015   Primary osteoarthritis involving multiple joints 07/06/2015   Last Assessment & Plan:  Relevant Hx: Course: Daily Update: Today's Plan:she has DDD of her back with some spinal  Stenosis that she has seen ortho locally for and he wanted to do surgery which she has avoided and I agree with, she is going to use the flexeril  for this right now as she is having more muscle spasm than what she has had in the past from her attempt to walk down some very steep steps that she was not used to . She should use her walker for support through the  weekend and avoid overhead strain and lifting and stay in touch if not better.  Electronically signed by: Eleanor Merlynn Lady, NP 07/06/15 [phone]   Primary osteoarthritis of left hip 03/20/2020   Seasonal allergic rhinitis 01/12/2019   Sleep apnea    Spinal stenosis of lumbar region with neurogenic claudication 06/20/2020   Added automatically from request for surgery [id number]     Stress incontinence of urine 04/17/2016   Syncope and collapse 10/13/2016   Thyroid  disease    TIA (transient ischemic attack) 10/13/2016   Type 2 diabetes mellitus without complication, without long-term current use of insulin  (HCC) 03/09/2012   Last Assessment & Plan:  She brings in readings of her sugars which show she has been doing much better overall with this at home and quite pleased with her   UTI (urinary tract  infection)    Vascular calcification 01/29/2016   Visual changes 01/29/2016   Overview:  Following with Dr. Hugh as well as had recent carotid doppler showing minimal plaque present and vertebral arteries normal , nothing signficant   Vitamin B12 deficiency 12/04/2015    Past Surgical History:  Procedure Laterality Date   ABDOMINAL HYSTERECTOMY     CHOLECYSTECTOMY     EYE SURGERY     PANCREAS SURGERY     skin cancer lesion      Family History  Problem Relation Age of Onset   Diabetes Mother    Hypertension Mother    Cancer Sister    Hypertension Father     Social History:  reports that she has never smoked. She has been exposed to tobacco smoke. Her smokeless tobacco use includes snuff. She reports that she does not drink alcohol and does not use drugs.The patient is accompanied by daughter today.  Allergies:  Allergies  Allergen Reactions   Doxycycline Nausea And Vomiting and Other (See Comments)    GI Intolerance   Nitrofurantoin Nausea And Vomiting    Cramps, GI Upset, Fever, weakness, nausea    Statins Other (See Comments)     pancreatitis   Sulfamethoxazole  Rash    Trimethoprim  Rash    Current Medications: Current Outpatient Medications  Medication Sig Dispense Refill   Accu-Chek FastClix Lancets MISC 1 Lancet by Other route 3 (three) times daily.     albuterol (PROVENTIL) (2.5 MG/3ML) 0.083% nebulizer solution Take 2.5 mg by nebulization every 4 (four) hours as needed for wheezing or shortness of breath.     albuterol (VENTOLIN HFA) 108 (90 Base) MCG/ACT inhaler Inhale 2 puffs into the lungs every 4 (four) hours as needed for wheezing or shortness of breath.     Alirocumab  (PRALUENT ) 150 MG/ML SOAJ INJECT THE CONTENTS OF 1 PEN  SUBCUTANEOUSLY EVERY 2 WEEKS 6 mL 2   ALPRAZolam  (XANAX ) 0.5 MG tablet Take 0.5 mg by mouth 3 (three) times daily as needed for anxiety.      amLODipine  (NORVASC ) 5 MG tablet Take 2.5 mg by mouth daily.      aspirin  EC 81 MG tablet Take 81 mg by mouth daily.     BD PEN NEEDLE MICRO U/F 32G X 6 MM MISC 10-12 Units by Other route at bedtime.     benazepril  (LOTENSIN ) 20 MG tablet Take 20 mg by mouth daily.  5   Blood Glucose Monitoring Suppl (ACCU-CHEK AVIVA PLUS) w/Device KIT See admin instructions.  0   celecoxib (CELEBREX) 200 MG capsule Take 200 mg by mouth daily as needed for mild pain or moderate pain.     cephALEXin  (KEFLEX ) 250 MG capsule Take 250 mg by mouth daily.     Continuous Glucose Receiver (DEXCOM G7 RECEIVER) DEVI 1 Device by Other route as needed.     Continuous Glucose Sensor (DEXCOM G7 SENSOR) MISC 1 Device by Other route daily.     cyanocobalamin  (,VITAMIN B-12,) 1000 MCG/ML injection Inject into the muscle every 30 (thirty) days.     donepezil  (ARICEPT ) 5 MG tablet Take 5 mg by mouth at bedtime.  5   fluticasone  (FLONASE ) 50 MCG/ACT nasal spray Place 1 spray into the nose 2 (two) times daily.     furosemide  (LASIX ) 40 MG tablet Take 1 tablet (40 mg total) by mouth 2 (two) times daily. (Patient taking differently: Take 40 mg by mouth 2 (two) times daily. Patient takes 20 mg twice a day (half a pill))  180  tablet 3   gabapentin  (NEURONTIN ) 300 MG capsule Take 300 mg by mouth daily.  11   glimepiride  (AMARYL ) 1 MG tablet Take 0.5 tablets by mouth in the morning and at bedtime.   4   insulin  glargine (LANTUS ) 100 UNIT/ML Solostar Pen Inject 10-40 Units into the skin at bedtime. Sliding scale     isosorbide  mononitrate (IMDUR ) 30 MG 24 hr tablet Take 1 tablet (30 mg total) by mouth daily. 90 tablet 3   levothyroxine  (SYNTHROID , LEVOTHROID) 75 MCG tablet Take 75 mcg by mouth daily.  2   loratadine  (CLARITIN ) 10 MG tablet Take 10 mg by mouth daily as needed for allergies.     metFORMIN  (GLUCOPHAGE -XR) 500 MG 24 hr tablet Take 500 mg by mouth 2 (two) times daily.  2   metoprolol  tartrate (LOPRESSOR ) 50 MG tablet Take 50 mg by mouth 2 (two) times daily.     montelukast  (SINGULAIR ) 10 MG tablet Take 10 mg by mouth at bedtime.  3   NITRO-BID  2 % ointment APPLY 1 INCH TOPICALLY AT  BEDTIME 90 g 1   nystatin (MYCOSTATIN/NYSTOP) powder Apply 1 Application topically 2 (two) times daily.     nystatin cream (MYCOSTATIN) Apply 1 Application topically 2 (two) times daily.     SYMBICORT  160-4.5 MCG/ACT inhaler Inhale 2 puffs into the lungs 2 (two) times daily.  2   traMADol (ULTRAM) 50 MG tablet Take 50 mg by mouth every 6 (six) hours as needed for moderate pain.     nitroGLYCERIN  (NITROSTAT ) 0.4 MG SL tablet DISSOLVE 1 TABLET UNDER THE  TONGUE EVERY 5 MINUTES AS NEEDED FOR CHEST PAIN. MAX OF 3 TABLETS IN 15 MINUTES. CALL 911 IF PAIN  PERSISTS. (Patient taking differently: Place 0.4 mg under the tongue every 5 (five) minutes as needed for chest pain.) 50 tablet 7   No current facility-administered medications for this visit.     ASSESSMENT & PLAN:   Assessment:  Wendy Leon is a 89 y.o. female with history of iron deficiency anemia who has received iron infusions twice in the past with Dr. Ezzard and tolerated well. We discussed that we can repeat iron infusions again if needed. I explained that we are also  checking Vitamin B12 and folate today and will supplement that if needed as well. She verbalizes understanding of the plan. We discussed that her anemia could be related to her kidney disease as well. I will call with iron results and we will schedule for iron if needed.   Plan: 1.  Return to clinic for IV iron if needed, if not, will repeat evaluation in 3 months.   I discussed the assessment and treatment plan with the patient.  The patient was provided an opportunity to ask questions and all were answered.  The patient agreed with the plan and demonstrated an understanding of the instructions.    Thank you for the referral    45 minutes was spent in patient care.  This included time spent preparing to see the patient (e.g., review of tests), obtaining and/or reviewing separately obtained history, counseling and educating the patient/family/caregiver, ordering medications, tests, or procedures; documenting clinical information in the electronic or other health record, independently interpreting results and communicating results to the patient/family/caregiver as well as coordination of care.      Eleanor DELENA Bach, NP   Family Nurse Practitioner - Board Certified [facility] [place] [phone]

## 2024-01-01 ENCOUNTER — Other Ambulatory Visit: Payer: Self-pay | Admitting: Hematology and Oncology

## 2024-01-01 ENCOUNTER — Telehealth: Payer: Self-pay

## 2024-01-01 NOTE — Telephone Encounter (Signed)
 Pt's daughter asking if we can explain to them why her counts are low?  They haven't seen any blood in stool or urine. Is this normal for her age?  Latest Reference Range & Units 12/31/23 14:27  Iron 28 - 170 ug/dL 19 (L)  UIBC ug/dL 526  TIBC 749 - 549 ug/dL 508 (H)  Saturation Ratios 10.4 - 31.8 % 4 (L)  Ferritin 11 - 307 ng/mL 9 (L)  Folate >5.9 ng/mL 10.5  Vitamin B12 180 - 914 pg/mL 1,157 (H)     Latest Reference Range & Units 12/31/23 14:27  Hemoglobin 12.0 - 15.0 g/dL 8.3 (L)  HCT 63.9 - 53.9 % 25.7 (L)   (L): Data is abnormally low (H): Data is abnormally high

## 2024-01-04 ENCOUNTER — Inpatient Hospital Stay

## 2024-01-04 VITALS — BP 152/79 | HR 57 | Temp 98.1°F | Resp 18

## 2024-01-04 DIAGNOSIS — D649 Anemia, unspecified: Secondary | ICD-10-CM

## 2024-01-04 DIAGNOSIS — D509 Iron deficiency anemia, unspecified: Secondary | ICD-10-CM | POA: Diagnosis not present

## 2024-01-04 LAB — MULTIPLE MYELOMA PANEL, SERUM
Albumin SerPl Elph-Mcnc: 3.5 g/dL (ref 2.9–4.4)
Albumin/Glob SerPl: 1.1 (ref 0.7–1.7)
Alpha 1: 0.2 g/dL (ref 0.0–0.4)
Alpha2 Glob SerPl Elph-Mcnc: 1.1 g/dL — ABNORMAL HIGH (ref 0.4–1.0)
B-Globulin SerPl Elph-Mcnc: 1.2 g/dL (ref 0.7–1.3)
Gamma Glob SerPl Elph-Mcnc: 1 g/dL (ref 0.4–1.8)
Globulin, Total: 3.5 g/dL (ref 2.2–3.9)
IgA: 238 mg/dL (ref 64–422)
IgG (Immunoglobin G), Serum: 1051 mg/dL (ref 586–1602)
IgM (Immunoglobulin M), Srm: 93 mg/dL (ref 26–217)
Total Protein ELP: 7 g/dL (ref 6.0–8.5)

## 2024-01-04 MED ORDER — SODIUM CHLORIDE 0.9 % IV SOLN: INTRAVENOUS | Status: DC

## 2024-01-04 MED ORDER — SODIUM CHLORIDE 0.9 % IV SOLN
510.0000 mg | Freq: Once | INTRAVENOUS | Status: AC
  Administered 2024-01-04: 510 mg via INTRAVENOUS
  Filled 2024-01-04: qty 510

## 2024-01-04 MED ORDER — LORATADINE 10 MG PO TABS: 10.0000 mg | ORAL_TABLET | Freq: Once | ORAL | Status: DC

## 2024-01-04 MED ORDER — ACETAMINOPHEN 325 MG PO TABS: 650.0000 mg | ORAL_TABLET | Freq: Once | ORAL | Status: DC

## 2024-01-04 NOTE — Patient Instructions (Signed)

## 2024-01-05 ENCOUNTER — Encounter: Payer: Self-pay | Admitting: Hematology and Oncology

## 2024-01-11 ENCOUNTER — Inpatient Hospital Stay

## 2024-01-11 VITALS — BP 160/52 | HR 60 | Temp 98.4°F | Resp 18

## 2024-01-11 DIAGNOSIS — D649 Anemia, unspecified: Secondary | ICD-10-CM

## 2024-01-11 DIAGNOSIS — D509 Iron deficiency anemia, unspecified: Secondary | ICD-10-CM | POA: Diagnosis not present

## 2024-01-11 MED ORDER — ACETAMINOPHEN 325 MG PO TABS: 650.0000 mg | ORAL_TABLET | Freq: Once | ORAL | Status: DC

## 2024-01-11 MED ORDER — SODIUM CHLORIDE 0.9 % IV SOLN: INTRAVENOUS | Status: DC

## 2024-01-11 MED ORDER — SODIUM CHLORIDE 0.9 % IV SOLN
510.0000 mg | Freq: Once | INTRAVENOUS | Status: AC
  Administered 2024-01-11: 510 mg via INTRAVENOUS
  Filled 2024-01-11: qty 510

## 2024-01-11 MED ORDER — LORATADINE 10 MG PO TABS: 10.0000 mg | ORAL_TABLET | Freq: Once | ORAL | Status: DC

## 2024-01-11 NOTE — Patient Instructions (Signed)

## 2024-01-12 ENCOUNTER — Encounter: Payer: Self-pay | Admitting: Hematology and Oncology

## 2024-01-19 ENCOUNTER — Ambulatory Visit (INDEPENDENT_AMBULATORY_CARE_PROVIDER_SITE_OTHER): Admitting: Radiology

## 2024-01-19 ENCOUNTER — Ambulatory Visit (HOSPITAL_BASED_OUTPATIENT_CLINIC_OR_DEPARTMENT_OTHER)
Admission: EM | Admit: 2024-01-19 | Discharge: 2024-01-19 | Disposition: A | Discharge status: Home / Self Care | Attending: Family Medicine | Admitting: Family Medicine

## 2024-01-19 ENCOUNTER — Encounter (HOSPITAL_BASED_OUTPATIENT_CLINIC_OR_DEPARTMENT_OTHER): Payer: Self-pay

## 2024-01-19 DIAGNOSIS — R0781 Pleurodynia: Secondary | ICD-10-CM

## 2024-01-19 DIAGNOSIS — S52532A Colles' fracture of left radius, initial encounter for closed fracture: Secondary | ICD-10-CM | POA: Diagnosis not present

## 2024-01-19 DIAGNOSIS — W19XXXA Unspecified fall, initial encounter: Secondary | ICD-10-CM | POA: Diagnosis not present

## 2024-01-19 DIAGNOSIS — M25532 Pain in left wrist: Secondary | ICD-10-CM

## 2024-01-19 DIAGNOSIS — M25512 Pain in left shoulder: Secondary | ICD-10-CM | POA: Diagnosis not present

## 2024-01-19 MED ORDER — TRAMADOL HCL 50 MG PO TABS: 50.0000 mg | ORAL_TABLET | Freq: Two times a day (BID) | ORAL | 0 refills | Status: AC | PRN

## 2024-01-19 NOTE — ED Notes (Signed)
 Ice pack and elevation provided to left wrist.

## 2024-01-19 NOTE — ED Triage Notes (Addendum)
 Patient usually uses walker at home. Fell this morning. Left shoulder pain, left wrist pain with deformity. Patient able to raise left arm laterally, but with pain. Pain to left rib area. Last meal at 1900 last night. Skin to left hand p/w/d. Good radial pulse. Cap refill wnl.

## 2024-01-19 NOTE — Discharge Instructions (Signed)
 Your shoulder and rib xrays were normal.  You have a pretty bad wrist fracture. We placed a splint today.  Recommend icing the area about the splint.  You can take Tylenol  for pain as needed.  Please follow-up with orthopedic as soon as possible for further management of this fracture. They did note a lung nodule that may be benign. Please follow up with your regular doctor for this.

## 2024-01-19 NOTE — ED Provider Notes (Signed)
 [facility]    CSN: [id number] Arrival date & time: 01/19/24  1048      History   Chief Complaint Chief Complaint  Patient presents with   wrist deformity   Shoulder Injury    HPI Wendy Leon is a 89 y.o. female.   Patient is an 89 year old female who presents today with left shoulder injury, left rib pain and left wrist injury and deformity after a fall.  Patient usually uses walker at home. Fell this morning. Left shoulder pain, left wrist pain with deformity. Patient able to raise left arm laterally, but with pain. Pain to left rib area.    Shoulder Injury    Past Medical History:  Diagnosis Date   Abnormal gait 06/07/2009   Last Assessment & Plan:  She saw specialist who has given her orders for PT and she is wanting to have it setup for her at [facility] where she already is receiving care from at home   Acid reflux 02/20/2014   Aftercare following surgery 11/27/2015   Anemia of unknown etiology 10/18/2016   Anxiety 02/20/2014   Atrial fibrillation (HCC)    Bilateral bunions 11/15/2020   CAD (coronary artery disease)    Carotid aneurysm, right 09/11/2022   2mm. observe [facility] 2024     Cerebral ischemia 03/11/2016   Overview:  Microvascular ischemia seen on MRI 01/2015 with partial empty sella turcica neurology appt 03/12/2016 pending with [facility], also [facility] with PT coming to the house for her gait abnormality and lumbar DDD, as well as local ortho (durranni)    Chest pain 11/03/2018   CHF (congestive heart failure) (HCC) 06/24/2015   Last Assessment & Plan:  Relevant Hx: Course: Daily Update: Today's Plan:she has been doing well overall from her heart and no SOB  Electronically signed by: Eleanor Merlynn Lady, NP 06/26/15 1522   Chronic diastolic heart failure (HCC) 06/24/2015   Last Assessment & Plan:  Relevant Hx: Course: Daily Update: Today's Plan:she has been doing well overall from her heart and no SOB  Electronically signed by:  Eleanor Merlynn Lady, NP 06/26/15 1522   Chronic low back pain 02/03/2022   Chronic UTI 06/27/2015   Last Assessment & Plan:  She requested a UA be checked for her as she was having more frequency than before and it has been awhile since she was on oral abx for this, she is on low dose maintenance med to prevent and has done well with that.   CKD stage 3 due to type 2 diabetes mellitus (HCC) 02/24/2018   CKD stage 3a, GFR 45-59 ml/min (HCC) 02/24/2018   Colon polyps    COPD (chronic obstructive pulmonary disease) (HCC) 03/09/2012   Last Assessment & Plan:  She feels she is stable from this discussed with her the allergies she has and she is going to discuss with Dr. Mardee having allergy injections as well for this    Diabetes (HCC) 02/20/2014   Dizziness 02/07/2016   Overview:  Chronic and has been having falls, MRI showing chronic microvascular ischemia, as well as partial sella turcica pending neuro appt for her with [facility]   Dyslipidemia 07/29/2015   Edema 06/24/2015   Last Assessment & Plan:  Relevant Hx: Course: Daily Update: Today's Plan:this is stable for her overall and will follow for her  Electronically signed by: Eleanor Merlynn Lady, NP 06/27/15 0813   Elevated cholesterol    Essential hypertension 03/09/2012   Last Assessment & Plan:  On  repeat this was stable for her and she does not check it at home much but when has been checked has been fine for her   Fistula 02/20/2014   Gastroesophageal reflux disease without esophagitis 02/20/2014   Last Assessment & Plan:  Relevant Hx: Course: Daily Update: Today's Plan:not on meds for this as she has had issues with her arms cramping and she is going to just drink buttermilk  Electronically signed by: Eleanor Merlynn Lady, NP 06/26/15 1530   Generalized abdominal pain 10/24/2016   Hallux valgus of left foot 12/04/2015   Hallux valgus of right foot 12/04/2015   High risk medication use 06/24/2015   History of cancer  03/09/2012   Initial excision 03/18/2000 (Dr. Darrow Stank [facility]), 8 x 6   x 3 cm.  Local recurrence, re-excision 03/23/2007 [facility]), 5 x 3 x 2 cm.  Surveillance plan: baseline MRI April 2014     History of COVID-19 12/16/2019   11/2019 - Quarantined at home     History of recurrent UTIs 06/15/2019   Hx of valvular heart disease 01/29/2016   Hypertension 02/20/2014   Hypertensive heart disease with heart failure (HCC) 03/09/2012   Last Assessment & Plan:  On repeat this was stable for her and she does not check it at home much but when has been checked has been fine for her   Hypokalemia 06/21/2020   Hyponatremia 08/23/2022   Hypothyroidism (acquired) 03/09/2012   Last Assessment & Plan:  She was due for this to be updated and have advised her to get this drawn while here today   IBS (irritable bowel syndrome) 06/24/2015   Last Assessment & Plan:  Relevant Hx: Course: Daily Update: Today's Plan:this is stable for her  Electronically signed by: Eleanor Merlynn Lady, NP 06/26/15 1531   IBS (irritable bowel syndrome)    Idiopathic progressive polyneuropathy 06/07/2009   Iron deficiency anemia 02/20/2014   Late onset Alzheimer's dementia without behavioral disturbance (HCC) 10/06/2019   Left bundle branch block 06/24/2015   Leukocytosis 06/24/2015   Last Assessment & Plan:  Relevant Hx: Course: Daily Update: Today's Plan:this has been stable for her with FU through hematology who felt she was having benign swings in this and she chose to not have bone marrow biopsy done   Electronically signed by: Eleanor Merlynn Lady, NP 06/27/15 [phone]   Liposarcoma, 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 Darrow Stank [facility]), 8 x 6 x 3 cm. Local recurrence, re-excision 03/23/2007 [facility]), 5 x 3 x 2 cm. Surveillance plan: baseline MRI April 2014  Last Assessment & Plan:  Relevant Hx: Course: Daily Update: Today's Plan:she is  stable from this  Electronically signed by: Eleanor Merlynn Lady, NP 06/26/15 1533   Lumbar degenerative disc disease 11/06/2015   Last Assessment & Plan:  At this point she needs updated MRI of her lumbar spine and we discussed with her gait abnormality she is likely going to have to see specialist at [facility] for this, she has seen local neuro for this several years ago in terms of her neuropathy and her falls and he really did not address it . She was advised several years ago to have back surgery locally which we did not feel was ideal with her sugars at the time being so out of control and now she is really more in control than then and it may be doable for her if felt  Needed though she would have  to have cardiology clear her as well.   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   Last Assessment & Plan:  Relevant Hx: Course: Daily Update: Today's Plan:she is doing well overall  Electronically signed by: Eleanor Merlynn Lady, NP 06/26/15 1531   Pancreatitis    Pre-ulcerative calluses 12/04/2015   Pre-ulcerative corn or callous 11/27/2015   Primary osteoarthritis involving multiple joints 07/06/2015   Last Assessment & Plan:  Relevant Hx: Course: Daily Update: Today's Plan:she has DDD of her back with some spinal  Stenosis that she has seen ortho locally for and he wanted to do surgery which she has avoided and I agree with, she is going to use the flexeril  for this right now as she is having more muscle spasm than  what she has had in the past from her attempt to walk down some very steep steps that she was not used to . She should use her walker for support through the weekend and avoid overhead strain and lifting and stay in touch if not better.  Electronically signed by: Eleanor Merlynn Lady, NP 07/06/15 [phone]   Primary osteoarthritis of left hip 03/20/2020   Seasonal allergic rhinitis 01/12/2019   Sleep apnea    Spinal stenosis of lumbar region with neurogenic claudication 06/20/2020   Added automatically from request for surgery [id number]     Stress incontinence of urine 04/17/2016   Syncope and collapse 10/13/2016   Thyroid  disease    TIA (transient ischemic attack) 10/13/2016   Type 2 diabetes mellitus without complication, without long-term current use of insulin  (HCC) 03/09/2012   Last Assessment & Plan:  She brings in readings of her sugars which show she has been doing much better overall with this at home and quite pleased with her   UTI (urinary tract infection)    Vascular calcification 01/29/2016   Visual changes 01/29/2016   Overview:  Following with Dr. Hugh as well as had recent carotid doppler showing minimal plaque present and vertebral arteries normal , nothing signficant   Vitamin B12 deficiency 12/04/2015    Patient Active Problem List   Diagnosis Date Noted   Carotid aneurysm, right 09/11/2022   Hyponatremia 08/23/2022   Chronic low back pain 02/03/2022   Bilateral bunions 11/15/2020   Hypokalemia 06/21/2020   Spinal stenosis of lumbar region with neurogenic claudication 06/20/2020   Primary osteoarthritis of left hip 03/20/2020   Thyroid  disease    Sleep apnea    Pancreatitis    Elevated cholesterol    Colon polyps    CAD (coronary artery disease)    Atrial fibrillation (HCC)    History of COVID-19 12/16/2019   Late onset Alzheimer's dementia without behavioral disturbance (HCC) 10/06/2019   History of recurrent UTIs 06/15/2019   Seasonal allergic rhinitis  01/12/2019   CKD stage 3a, GFR 45-59 ml/min (HCC) 02/24/2018   CKD stage 3 due to type 2 diabetes mellitus (HCC) 02/24/2018   Generalized abdominal pain 10/24/2016   Anemia of unknown etiology  10/18/2016   TIA (transient ischemic attack) 10/13/2016   Stress incontinence of urine 04/17/2016   Cerebral ischemia 03/11/2016   Dizziness 02/07/2016   Hx of valvular heart disease 01/29/2016   Vascular calcification 01/29/2016   Visual changes 01/29/2016   Hallux valgus of left foot 12/04/2015   Hallux valgus of right foot 12/04/2015   Pre-ulcerative calluses 12/04/2015   Vitamin B12 deficiency 12/04/2015   Aftercare following surgery 11/27/2015   Onychomycosis due to dermatophyte 11/27/2015   Pre-ulcerative corn or callous 11/27/2015   Lumbar degenerative disc disease 11/06/2015   Dyslipidemia 07/29/2015   Mild CAD 07/29/2015   Primary osteoarthritis involving multiple joints 07/06/2015   Chronic UTI 06/27/2015   Chronic diastolic heart failure (HCC) 06/24/2015   Edema 06/24/2015   High risk medication use 06/24/2015   IBS (irritable bowel syndrome) 06/24/2015   Left bundle branch block 06/24/2015   Mixed hyperlipidemia 06/24/2015   Obstructive sleep apnea syndrome 06/24/2015   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   CHF (congestive heart failure) (HCC) 06/24/2015   Acid reflux 02/20/2014   Anxiety 02/20/2014   Diabetes (HCC) 02/20/2014   Gastroesophageal reflux disease without esophagitis 02/20/2014   Fistula 02/20/2014   Hypertension 02/20/2014   Iron deficiency anemia 02/20/2014   COPD (chronic obstructive pulmonary disease) (HCC) 03/09/2012   Hypertensive heart disease with heart failure (HCC) 03/09/2012   Hypothyroidism (acquired) 03/09/2012   Liposarcoma, 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      Home Medications    Prior to Admission medications   Medication Sig Start Date End Date Taking? Authorizing Provider  Accu-Chek FastClix Lancets MISC 1 Lancet by Other route 3 (three) times daily. 12/25/22   [provider]  albuterol (PROVENTIL) (2.5 MG/3ML) 0.083% nebulizer solution Take 2.5 mg by nebulization every 4 (four) hours as needed for wheezing or shortness of breath. 02/03/20   [provider]  albuterol (VENTOLIN HFA) 108 (90 Base) MCG/ACT inhaler Inhale 2 puffs into the lungs every 4 (four) hours as needed for wheezing or shortness of breath. 12/30/19   [provider]  Alirocumab  (PRALUENT ) 150 MG/ML SOAJ INJECT THE CONTENTS OF 1 PEN  SUBCUTANEOUSLY EVERY 2 WEEKS 08/17/23   Monetta Redell PARAS, MD  ALPRAZolam  (XANAX ) 0.5 MG tablet Take 0.5 mg by mouth 3 (three) times daily as needed for anxiety.  10/13/16   [provider]  amLODipine  (NORVASC ) 5 MG tablet Take 2.5 mg by mouth daily.     [provider]  aspirin  EC 81 MG tablet Take 81 mg by mouth daily.    [provider]  BD PEN NEEDLE MICRO U/F 32G X 6 MM MISC 10-12 Units by Other route at bedtime. 12/22/22   [provider]  benazepril  (LOTENSIN ) 20 MG tablet Take 20 mg by mouth daily. 05/26/17   [provider]  Blood Glucose Monitoring Suppl (ACCU-CHEK AVIVA PLUS) w/Device KIT See admin instructions. 10/07/16   [provider]  celecoxib (CELEBREX) 200 MG capsule Take 200  mg by mouth daily as needed for mild pain or moderate pain. 08/16/21   [provider]  cephALEXin  (KEFLEX ) 250 MG capsule Take 250 mg by mouth daily.    [provider]  Continuous  Glucose Receiver (DEXCOM G7 RECEIVER) DEVI 1 Device by Other route as needed. 01/05/23   [provider]  Continuous Glucose Sensor (DEXCOM G7 SENSOR) MISC 1 Device by Other route daily. 01/05/23   [provider]  cyanocobalamin  (,VITAMIN B-12,) 1000 MCG/ML injection Inject into the muscle every 30 (thirty) days. 03/08/19   [provider]  donepezil  (ARICEPT ) 5 MG tablet Take 5 mg by mouth at bedtime. 11/14/16   [provider]  fluticasone  (FLONASE ) 50 MCG/ACT nasal spray Place 1 spray into the nose 2 (two) times daily. 01/29/12   [provider]  furosemide  (LASIX ) 40 MG tablet Take 1 tablet (40 mg total) by mouth 2 (two) times daily. Patient taking differently: Take 40 mg by mouth 2 (two) times daily. Patient takes 20 mg once a day 09/25/21   Monetta Redell PARAS, MD  gabapentin  (NEURONTIN ) 300 MG capsule Take 300 mg by mouth daily. 11/14/16   [provider]  glimepiride  (AMARYL ) 1 MG tablet Take 0.5 tablets by mouth in the morning and at bedtime.  11/13/16   [provider]  insulin  glargine (LANTUS ) 100 UNIT/ML Solostar Pen Inject 10-40 Units into the skin at bedtime. Sliding scale 05/21/18   [provider]  isosorbide  mononitrate (IMDUR ) 30 MG 24 hr tablet Take 1 tablet (30 mg total) by mouth daily. 09/14/20   Monetta Redell PARAS, MD  levothyroxine  (SYNTHROID , LEVOTHROID) 75 MCG tablet Take 75 mcg by mouth daily. 11/14/16   [provider]  loratadine  (CLARITIN ) 10 MG tablet Take 10 mg by mouth daily as needed for allergies.    [provider]  metFORMIN  (GLUCOPHAGE -XR) 500 MG 24 hr tablet Take 500 mg by mouth 2 (two) times daily. 11/14/16   [provider]  metoprolol  tartrate (LOPRESSOR ) 50 MG tablet Take 50 mg by mouth 2 (two) times daily.    [provider]  montelukast  (SINGULAIR ) 10 MG tablet Take 10 mg by mouth at bedtime. 11/17/16   [provider]  NITRO-BID  2 % ointment APPLY 1 INCH  TOPICALLY AT  BEDTIME 11/23/23   Monetta Redell PARAS, MD  nitroGLYCERIN  (NITROSTAT ) 0.4 MG SL tablet DISSOLVE 1 TABLET UNDER THE  TONGUE EVERY 5 MINUTES AS NEEDED FOR CHEST PAIN. MAX OF 3 TABLETS IN 15 MINUTES. CALL 911 IF PAIN  PERSISTS. Patient taking differently: Place 0.4 mg under the tongue every 5 (five) minutes as needed for chest pain. 02/11/22   Monetta Redell PARAS, MD  nystatin (MYCOSTATIN/NYSTOP) powder Apply 1 Application topically 2 (two) times daily. 07/09/22   [provider]  nystatin cream (MYCOSTATIN) Apply 1 Application topically 2 (two) times daily. 11/07/20   [provider]  SYMBICORT  160-4.5 MCG/ACT inhaler Inhale 2 puffs into the lungs 2 (two) times daily. 11/17/16   [provider]  traMADol  (ULTRAM ) 50 MG tablet Take 1 tablet (50 mg total) by mouth every 12 (twelve) hours as needed for up to 5 days for moderate pain (pain score 4-6). 01/19/24 01/24/24  Adah Wilbert LABOR, FNP    Family History Family History  Problem Relation Age of Onset   Diabetes Mother    Hypertension Mother    Cancer Sister    Hypertension Father     Social History Social History   Tobacco Use  Smoking status: Never    Passive exposure: Past   Smokeless tobacco: Current    Types: Snuff  Vaping Use   Vaping status: Never Used  Substance Use Topics   Alcohol use: No   Drug use: No     Allergies   Doxycycline, Nitrofurantoin, Statins, Sulfamethoxazole , and Trimethoprim    Review of Systems Review of Systems   Physical Exam Triage Vital Signs ED Triage Vitals  Encounter Vitals Group     BP 01/19/24 1102 (!) 145/79     Girls Systolic BP Percentile --      Girls Diastolic BP Percentile --      Boys Systolic BP Percentile --      Boys Diastolic BP Percentile --      Pulse Rate 01/19/24 1102 (!) 55     Resp 01/19/24 1102 20     Temp 01/19/24 1102 98.2 F (36.8 C)     Temp Source 01/19/24 1102 Oral     SpO2 01/19/24 1102 94 %     Weight --      Height --       Head Circumference --      Peak Flow --      Pain Score 01/19/24 1105 5     Pain Loc --      Pain Education --      Exclude from Growth Chart --    No data found.  Updated Vital Signs BP (!) 145/79 (BP Location: Right Arm)   Pulse (!) 55   Temp 98.2 F (36.8 C) (Oral)   Resp 20   SpO2 94%   Visual Acuity Right Eye Distance:   Left Eye Distance:   Bilateral Distance:    Right Eye Near:   Left Eye Near:    Bilateral Near:     Physical Exam Vitals and nursing note reviewed.  Constitutional:      General: She is not in acute distress.    Appearance: Normal appearance. She is not ill-appearing, toxic-appearing or diaphoretic.  Pulmonary:     Effort: Pulmonary effort is normal.  Musculoskeletal:        General: Swelling, tenderness, deformity and signs of injury present.     Comments: Obvious deformity to left wrist with bruising and limited ROM.  Good radial pulse. Cap refill wnl. Limited ROM to left shoulder but no palpable bony tenderness.  Able to left arm.    Neurological:     Mental Status: She is alert.  Psychiatric:        Mood and Affect: Mood normal.      UC Treatments / Results  Labs (all labs ordered are listed, but only abnormal results are displayed) Labs Reviewed - No data to display  EKG   Radiology DG Shoulder Left Result Date: 01/19/2024 CLINICAL DATA:  Same day rib radiographs EXAM: LEFT SHOULDER - 2+ VIEW COMPARISON:  None Available. FINDINGS: There is no evidence of fracture or dislocation. There is no evidence of arthropathy or other focal bone abnormality. Soft tissues are unremarkable. Diffuse osteopenia. IMPRESSION: No acute osseous findings. Electronically Signed   By: Michaeline Blanch M.D.   On: 01/19/2024 12:17   DG Ribs Unilateral W/Chest Left Result Date: 01/19/2024 CLINICAL DATA:  Fall, left rib pain EXAM: LEFT RIBS AND CHEST - 3+ VIEW COMPARISON:  Same day shoulder radiographs and prior studies FINDINGS: Similar appearance of biapical  scarring at the lung apices. Nodular right hilar opacity, favored to represent prominent vasculature, but difficult to exclude pulmonary  nodule. No pleural effusions. No pneumothorax. No cardiomegaly. No rib fractures identified. Diffuse osteopenia, limiting evaluation. IMPRESSION: 1. No displaced left rib fractures identified. No pneumothorax or pleural effusions. 2. Nodular right hilar opacity, favored to represent prominent vasculature. However, difficult to exclude right hilar pulmonary nodule. Chest CT with IV contrast is recommended for further evaluation as clinically indicated. These results will be called to the ordering clinician or representative by the Radiologist Assistant, and communication documented in the PACS or [employer]. Electronically Signed   By: Michaeline Blanch M.D.   On: 01/19/2024 12:16   DG Wrist Complete Left Result Date: 01/19/2024 CLINICAL DATA:  Fall on outstretched arm, left wrist pain with deformity EXAM: LEFT WRIST - COMPLETE 3+ VIEW COMPARISON:  None Available. FINDINGS: Impacted distal radius fracture with dorsal angulation and likely nondisplaced, impacted fracture of the distal ulna and ulnar styloid process IMPRESSION: Impacted distal radius fracture with dorsal angulation. Minimally displaced distal ulna and ulnar styloid process fracture. Electronically Signed   By: Michaeline Blanch M.D.   On: 01/19/2024 12:11    Procedures Procedures (including critical care time)  Medications Ordered in UC Medications - No data to display  Initial Impression / Assessment and Plan / UC Course  I have reviewed the triage vital signs and the nursing notes.  Pertinent labs & imaging results that were available during my care of the patient were reviewed by me and considered in my medical decision making (see chart for details).     Fall-patient with fall earlier today and multiple injuries.  X-rays done today of the shoulder, ribs and left wrist. Shoulder x-ray without any  acute osseous findings.  Ribs unilateral with chest x-ray without any acute rib fractures, pneumothorax or pleural effusion but also did note nodular right hilar opacity and needs further CT with IV contrast to rule out. Left wrist x-ray with impacted distal radius fracture with dorsal angulation and likely nondisplaced impacted fracture of the distal ulna and ulnar styloid process Patient placed in a short arm splint here today for stabilization.  Recommendations were to follow-up with orthopedic for further management of this fracture.  Did give her some tramadol  for severe pain as needed but can also use Tylenol . Recommend rest, ice, elevate the arm Follow-up with PCP for further evaluation of lung nodule once this fracture is stabilized Final Clinical Impressions(s) / UC Diagnoses   Final diagnoses:  Fall, initial encounter     Discharge Instructions      Your shoulder and rib xrays were normal.  You have a pretty bad wrist fracture. We placed a splint today.  Recommend icing the area about the splint.  You can take Tylenol  for pain as needed.  Please follow-up with orthopedic as soon as possible for further management of this fracture. They did note a lung nodule that may be benign. Please follow up with your regular doctor for this.     ED Prescriptions     Medication Sig Dispense Auth. Provider   traMADol  (ULTRAM ) 50 MG tablet Take 1 tablet (50 mg total) by mouth every 12 (twelve) hours as needed for up to 5 days for moderate pain (pain score 4-6). 10 tablet Adah Corning A, FNP      I have reviewed the PDMP during this encounter.   Adah Corning LABOR, FNP 01/19/24 1311

## 2024-01-20 ENCOUNTER — Ambulatory Visit: Attending: Cardiology

## 2024-01-20 DIAGNOSIS — E782 Mixed hyperlipidemia: Secondary | ICD-10-CM

## 2024-01-20 DIAGNOSIS — I110 Hypertensive heart disease with heart failure: Secondary | ICD-10-CM

## 2024-01-20 DIAGNOSIS — R0600 Dyspnea, unspecified: Secondary | ICD-10-CM | POA: Diagnosis not present

## 2024-01-20 DIAGNOSIS — I25118 Atherosclerotic heart disease of native coronary artery with other forms of angina pectoris: Secondary | ICD-10-CM | POA: Diagnosis not present

## 2024-01-20 LAB — ECHOCARDIOGRAM COMPLETE
Area-P 1/2: 2.42 cm2
S' Lateral: 2.6 cm

## 2024-01-21 ENCOUNTER — Ambulatory Visit: Payer: Self-pay | Admitting: Cardiology

## 2024-02-08 ENCOUNTER — Other Ambulatory Visit: Payer: Self-pay

## 2024-02-08 ENCOUNTER — Encounter: Payer: Self-pay | Admitting: Hematology and Oncology

## 2024-02-08 ENCOUNTER — Inpatient Hospital Stay: Attending: Hematology and Oncology | Admitting: Hematology and Oncology

## 2024-02-08 ENCOUNTER — Inpatient Hospital Stay

## 2024-02-08 ENCOUNTER — Other Ambulatory Visit: Payer: Self-pay | Admitting: Hematology and Oncology

## 2024-02-08 VITALS — BP 156/55 | HR 56 | Resp 20 | Ht 64.0 in

## 2024-02-08 DIAGNOSIS — I2510 Atherosclerotic heart disease of native coronary artery without angina pectoris: Secondary | ICD-10-CM | POA: Diagnosis not present

## 2024-02-08 DIAGNOSIS — N1832 Chronic kidney disease, stage 3b: Secondary | ICD-10-CM | POA: Insufficient documentation

## 2024-02-08 DIAGNOSIS — Z7982 Long term (current) use of aspirin: Secondary | ICD-10-CM | POA: Insufficient documentation

## 2024-02-08 DIAGNOSIS — Z794 Long term (current) use of insulin: Secondary | ICD-10-CM | POA: Insufficient documentation

## 2024-02-08 DIAGNOSIS — D509 Iron deficiency anemia, unspecified: Secondary | ICD-10-CM | POA: Insufficient documentation

## 2024-02-08 DIAGNOSIS — Z7951 Long term (current) use of inhaled steroids: Secondary | ICD-10-CM | POA: Diagnosis not present

## 2024-02-08 DIAGNOSIS — G4733 Obstructive sleep apnea (adult) (pediatric): Secondary | ICD-10-CM | POA: Insufficient documentation

## 2024-02-08 DIAGNOSIS — Z79899 Other long term (current) drug therapy: Secondary | ICD-10-CM | POA: Diagnosis not present

## 2024-02-08 DIAGNOSIS — I130 Hypertensive heart and chronic kidney disease with heart failure and stage 1 through stage 4 chronic kidney disease, or unspecified chronic kidney disease: Secondary | ICD-10-CM | POA: Diagnosis not present

## 2024-02-08 DIAGNOSIS — Z79890 Hormone replacement therapy (postmenopausal): Secondary | ICD-10-CM | POA: Insufficient documentation

## 2024-02-08 DIAGNOSIS — E1122 Type 2 diabetes mellitus with diabetic chronic kidney disease: Secondary | ICD-10-CM | POA: Diagnosis not present

## 2024-02-08 DIAGNOSIS — D649 Anemia, unspecified: Secondary | ICD-10-CM | POA: Diagnosis not present

## 2024-02-08 DIAGNOSIS — I5032 Chronic diastolic (congestive) heart failure: Secondary | ICD-10-CM | POA: Insufficient documentation

## 2024-02-08 DIAGNOSIS — I4891 Unspecified atrial fibrillation: Secondary | ICD-10-CM | POA: Insufficient documentation

## 2024-02-08 DIAGNOSIS — E039 Hypothyroidism, unspecified: Secondary | ICD-10-CM | POA: Insufficient documentation

## 2024-02-08 DIAGNOSIS — Z7984 Long term (current) use of oral hypoglycemic drugs: Secondary | ICD-10-CM | POA: Insufficient documentation

## 2024-02-08 LAB — IRON AND TIBC
Iron: 102 ug/dL (ref 28–170)
Saturation Ratios: 33 % — ABNORMAL HIGH (ref 10.4–31.8)
TIBC: 308 ug/dL (ref 250–450)
UIBC: 206 ug/dL

## 2024-02-08 LAB — CBC WITH DIFFERENTIAL (CANCER CENTER ONLY)
Abs Immature Granulocytes: 0.03 K/uL (ref 0.00–0.07)
Basophils Absolute: 0.1 K/uL (ref 0.0–0.1)
Basophils Relative: 1 %
Eosinophils Absolute: 0.6 K/uL — ABNORMAL HIGH (ref 0.0–0.5)
Eosinophils Relative: 5 %
HCT: 33.4 % — ABNORMAL LOW (ref 36.0–46.0)
Hemoglobin: 10.8 g/dL — ABNORMAL LOW (ref 12.0–15.0)
Immature Granulocytes: 0 %
Lymphocytes Relative: 36 %
Lymphs Abs: 4.4 K/uL — ABNORMAL HIGH (ref 0.7–4.0)
MCH: 28.3 pg (ref 26.0–34.0)
MCHC: 32.3 g/dL (ref 30.0–36.0)
MCV: 87.7 fL (ref 80.0–100.0)
Monocytes Absolute: 1.7 K/uL — ABNORMAL HIGH (ref 0.1–1.0)
Monocytes Relative: 14 %
Neutro Abs: 5.4 K/uL (ref 1.7–7.7)
Neutrophils Relative %: 44 %
Platelet Count: 421 K/uL — ABNORMAL HIGH (ref 150–400)
RBC: 3.81 MIL/uL — ABNORMAL LOW (ref 3.87–5.11)
RDW: 18.9 % — ABNORMAL HIGH (ref 11.5–15.5)
WBC Count: 12.2 K/uL — ABNORMAL HIGH (ref 4.0–10.5)
nRBC: 0 % (ref 0.0–0.2)

## 2024-02-08 LAB — TSH: TSH: 3.51 u[IU]/mL (ref 0.350–4.500)

## 2024-02-08 LAB — CMP (CANCER CENTER ONLY)
ALT: 20 U/L (ref 0–44)
AST: 30 U/L (ref 15–41)
Albumin: 4.3 g/dL (ref 3.5–5.0)
Alkaline Phosphatase: 64 U/L (ref 38–126)
Anion gap: 15 (ref 5–15)
BUN: 23 mg/dL (ref 8–23)
CO2: 24 mmol/L (ref 22–32)
Calcium: 9.6 mg/dL (ref 8.9–10.3)
Chloride: 93 mmol/L — ABNORMAL LOW (ref 98–111)
Creatinine: 1.37 mg/dL — ABNORMAL HIGH (ref 0.44–1.00)
GFR, Estimated: 37 mL/min — ABNORMAL LOW (ref >60)
Glucose, Bld: 157 mg/dL — ABNORMAL HIGH (ref 70–99)
Potassium: 3.6 mmol/L (ref 3.5–5.1)
Sodium: 132 mmol/L — ABNORMAL LOW (ref 135–145)
Total Bilirubin: 0.3 mg/dL (ref 0.0–1.2)
Total Protein: 7.4 g/dL (ref 6.5–8.1)

## 2024-02-08 LAB — FOLATE: Folate: 11.4 ng/mL (ref >5.9)

## 2024-02-08 LAB — FERRITIN: Ferritin: 366 ng/mL — ABNORMAL HIGH (ref 11–307)

## 2024-02-08 LAB — VITAMIN B12: Vitamin B-12: 754 pg/mL (ref 180–914)

## 2024-02-08 NOTE — Progress Notes (Signed)
 [facility]

## 2024-02-08 NOTE — Progress Notes (Signed)
 [facility] [address] [place],  [place]  [address] [phone]  Clinic Day:  02/08/2024   Referring physician: Thurmond Cathlyn LABOR., MD  Patient Care Team: Patient Care Team: Thurmond Cathlyn LABOR., MD as PCP - General (Internal Medicine) Monetta Redell PARAS, MD as PCP - Cardiology (Cardiology) Georgina Elgin Isela DOUGLAS, MD as Referring Physician (Orthopedic Surgery) Carlin Delon BROCKS, NP as Nurse Practitioner (Cardiology)   REASON FOR CONSULTATION:  Anemia  HISTORY OF PRESENT ILLNESS:   Wendy Leon is a 89 y.o. female with a history of anemia who is referred in consultation by Cathlyn Thurmond, MD for assessment and management. She has a long history of iron deficient anemia and has received IV iron in the past with Dr. Ezzard. As she does have stage 3b chronic kidney disease, this could be a factor in her anemia. She notes increasing fatigue, shortness of breath, feeling cold, and restlessness. Most recent CBC on 08/28 revealed hemoglobin 9.0. She denies chest pain or cough. She denies issue with bowel or bladder. She denies changes in appetite or weight loss.   Medical, surgical and social history included below.     REVIEW OF SYSTEMS:   Review of Systems  Constitutional:  Positive for fatigue.  HENT:  Negative.    Eyes: Negative.   Respiratory:  Positive for shortness of breath.   Cardiovascular: Negative.   Gastrointestinal: Negative.   Endocrine: Negative.   Genitourinary: Negative.    Musculoskeletal:  Positive for arthralgias and myalgias.  Skin: Negative.   Neurological:  Positive for extremity weakness and light-headedness.  Hematological: Negative.   Psychiatric/Behavioral: Negative.       VITALS:   Blood pressure (!) 156/55, pulse (!) 56, resp. rate 20, height 5' 4 (1.626 m), SpO2 95%.  Wt Readings from Last 3 Encounters:  12/31/23 165 lb 6.4 oz (75 kg)  12/22/23 166 lb (75.3 kg)  06/02/23 158 lb 6.4 oz (71.8 kg)    Body mass index is 28.39  kg/m.  Performance status (ECOG): 2 - Symptomatic, <50% confined to bed  PHYSICAL EXAM:   Physical Exam Constitutional:      Appearance: Normal appearance. She is normal weight.  HENT:     Head: Normocephalic and atraumatic.     Mouth/Throat:     Mouth: Mucous membranes are dry.  Cardiovascular:     Rate and Rhythm: Normal rate and regular rhythm.     Pulses: Normal pulses.     Heart sounds: Normal heart sounds.  Pulmonary:     Effort: Pulmonary effort is normal.     Breath sounds: Normal breath sounds.  Abdominal:     General: Bowel sounds are normal.     Palpations: Abdomen is soft.  Musculoskeletal:        General: Normal range of motion.     Cervical back: Normal range of motion.  Skin:    General: Skin is warm and dry.  Neurological:     General: No focal deficit present.     Mental Status: She is alert and oriented to person, place, and time. Mental status is at baseline.  Psychiatric:        Mood and Affect: Mood normal.        Behavior: Behavior normal.        Thought Content: Thought content normal.        Judgment: Judgment normal.      LABS:      Latest Ref Rng & Units 02/08/2024  1:01 PM 12/31/2023    2:27 PM 08/24/2022    2:31 AM  CBC  WBC 4.0 - 10.5 K/uL 12.2  15.8  15.4   Hemoglobin 12.0 - 15.0 g/dL 89.1  8.3  87.5   Hematocrit 36.0 - 46.0 % 33.4  25.7  36.1   Platelets 150 - 400 K/uL 421  672  413       Latest Ref Rng & Units 02/08/2024    1:01 PM 12/31/2023    2:27 PM 08/24/2022    2:31 AM  CMP  Glucose 70 - 99 mg/dL 842  877  856   BUN 8 - 23 mg/dL 23  33  28   Creatinine 0.44 - 1.00 mg/dL 8.62  8.61  8.76   Sodium 135 - 145 mmol/L 132  132  134   Potassium 3.5 - 5.1 mmol/L 3.6  4.0  3.4   Chloride 98 - 111 mmol/L 93  96  94   CO2 22 - 32 mmol/L 24  20  26    Calcium 8.9 - 10.3 mg/dL 9.6  9.4  9.6   Total Protein 6.5 - 8.1 g/dL 7.4  7.5  7.1   Total Bilirubin 0.0 - 1.2 mg/dL 0.3  <9.7  0.7   Alkaline Phos 38 - 126 U/L 64  61  47    AST 15 - 41 U/L 30  28  21    ALT 0 - 44 U/L 20  18  22       No results found for: CEA1, CEA / No results found for: CEA1, CEA No results found for: PSA1 No results found for: CAN199 No results found for: RJW874  Lab Results  Component Value Date   TOTALPROTELP 7.0 12/31/2023   Lab Results  Component Value Date   TIBC 491 (H) 12/31/2023   FERRITIN 9 (L) 12/31/2023   IRONPCTSAT 4 (L) 12/31/2023   No results found for: LDH  STUDIES:   ECHOCARDIOGRAM COMPLETE Result Date: 01/20/2024    ECHOCARDIOGRAM REPORT   Patient Name:   EVELEIGH CRUMPLER Date of Exam: 01/20/2024 Medical Rec #:  [id number]    Height:       64.0 in Accession #:    [id number]   Weight:       165.4 lb Date of Birth:  02-12-1933     BSA:          1.805 m Patient Age:    89 years     BP:           145/79 mmHg Patient Gender: F            HR:           63 bpm. Exam Location:  [facility] Procedure: 2D Echo, Cardiac Doppler, Color Doppler and Strain Analysis (Both            Spectral and Color Flow Doppler were utilized during procedure). Indications:    Dyspnea R06.00  History:        Patient has prior history of Echocardiogram examinations, most                 recent 08/25/2022. CHF, CAD, TIA, Arrythmias:LBBB,                 Signs/Symptoms:Dyspnea; Risk Factors:Hypertension and                 Dyslipidemia.  Sonographer:    Charlie Jointer RDCS Referring Phys: [phone] JENNIFER C WOODY IMPRESSIONS  1. Left ventricular  ejection fraction, by estimation, is 60 to 65%. The left ventricle has normal function. The left ventricle has no regional wall motion abnormalities. There is mild concentric left ventricular hypertrophy. Left ventricular diastolic parameters are consistent with Grade I diastolic dysfunction (impaired relaxation). The average left ventricular global longitudinal strain is -18.6 %. The global longitudinal strain is normal.  2. Right ventricular systolic function is normal. The right ventricular size is normal.  There is normal pulmonary artery systolic pressure.  3. The mitral valve is degenerative. Mild mitral valve regurgitation. No evidence of mitral stenosis.  4. The aortic valve is tricuspid. Aortic valve regurgitation is not visualized. No aortic stenosis is present.  5. Aortic Normal DTA.  6. The inferior vena cava is normal in size with greater than 50% respiratory variability, suggesting right atrial pressure of 3 mmHg. FINDINGS  Left Ventricle: Left ventricular ejection fraction, by estimation, is 60 to 65%. The left ventricle has normal function. The left ventricle has no regional wall motion abnormalities. The average left ventricular global longitudinal strain is -18.6 %. Strain was performed and the global longitudinal strain is normal. The left ventricular internal cavity size was normal in size. There is mild concentric left ventricular hypertrophy. Left ventricular diastolic parameters are consistent with Grade I diastolic dysfunction (impaired relaxation). Normal left ventricular filling pressure. Right Ventricle: The right ventricular size is normal. No increase in right ventricular wall thickness. Right ventricular systolic function is normal. There is normal pulmonary artery systolic pressure. The tricuspid regurgitant velocity is 2.84 m/s, and  with an assumed right atrial pressure of 3 mmHg, the estimated right ventricular systolic pressure is 35.3 mmHg. Left Atrium: Left atrial size was normal in size. Right Atrium: Right atrial size was normal in size. Pericardium: There is no evidence of pericardial effusion. Mitral Valve: The mitral valve is degenerative in appearance. Mild mitral annular calcification. Mild mitral valve regurgitation. No evidence of mitral valve stenosis. Tricuspid Valve: The tricuspid valve is normal in structure. Tricuspid valve regurgitation is mild . No evidence of tricuspid stenosis. Aortic Valve: The aortic valve is tricuspid. Aortic valve regurgitation is not visualized.  No aortic stenosis is present. Pulmonic Valve: The pulmonic valve was normal in structure. Pulmonic valve regurgitation is mild. No evidence of pulmonic stenosis. Aorta: The aortic arch was not well visualized, Normal DTA and the aortic root and ascending aorta are structurally normal, with no evidence of dilitation. Venous: The right upper pulmonary vein is normal. The inferior vena cava is normal in size with greater than 50% respiratory variability, suggesting right atrial pressure of 3 mmHg. IAS/Shunts: No atrial level shunt detected by color flow Doppler.  LEFT VENTRICLE PLAX 2D LVIDd:         3.80 cm   Diastology LVIDs:         2.60 cm   LV e' medial:    6.85 cm/s LV PW:         1.00 cm   LV E/e' medial:  10.3 LV IVS:        1.40 cm   LV e' lateral:   7.07 cm/s LVOT diam:     1.90 cm   LV E/e' lateral: 10.0 LV SV:         74 LV SV Index:   41        2D Longitudinal Strain LVOT Area:     2.84 cm  2D Strain GLS Avg:     -18.6 %  RIGHT VENTRICLE  IVC RV Basal diam:  3.60 cm    IVC diam: 1.90 cm RV Mid diam:    2.30 cm RV S prime:     9.48 cm/s TAPSE (M-mode): 2.2 cm LEFT ATRIUM             Index        RIGHT ATRIUM           Index LA diam:        3.60 cm 1.99 cm/m   RA Area:     13.70 cm LA Vol (A2C):   40.1 ml 22.22 ml/m  RA Volume:   32.50 ml  18.01 ml/m LA Vol (A4C):   46.0 ml 25.49 ml/m LA Biplane Vol: 42.9 ml 23.77 ml/m  AORTIC VALVE LVOT Vmax:   110.00 cm/s LVOT Vmean:  71.700 cm/s LVOT VTI:    0.260 m  AORTA Ao Root diam: 2.90 cm Ao Asc diam:  3.40 cm Ao Desc diam: 2.00 cm MITRAL VALVE                TRICUSPID VALVE MV Area (PHT): 2.42 cm     TR Peak grad:   32.3 mmHg MV Decel Time: 313 msec     TR Vmax:        284.00 cm/s MV E velocity: 70.77 cm/s MV A velocity: 114.00 cm/s  SHUNTS MV E/A ratio:  0.62         Systemic VTI:  0.26 m                             Systemic Diam: 1.90 cm Redell Leiter MD Electronically signed by Redell Leiter MD Signature Date/Time: 01/20/2024/12:38:38 PM    Final     DG Shoulder Left Result Date: 01/19/2024 CLINICAL DATA:  Same day rib radiographs EXAM: LEFT SHOULDER - 2+ VIEW COMPARISON:  None Available. FINDINGS: There is no evidence of fracture or dislocation. There is no evidence of arthropathy or other focal bone abnormality. Soft tissues are unremarkable. Diffuse osteopenia. IMPRESSION: No acute osseous findings. Electronically Signed   By: Michaeline Blanch M.D.   On: 01/19/2024 12:17   DG Ribs Unilateral W/Chest Left Result Date: 01/19/2024 CLINICAL DATA:  Fall, left rib pain EXAM: LEFT RIBS AND CHEST - 3+ VIEW COMPARISON:  Same day shoulder radiographs and prior studies FINDINGS: Similar appearance of biapical scarring at the lung apices. Nodular right hilar opacity, favored to represent prominent vasculature, but difficult to exclude pulmonary nodule. No pleural effusions. No pneumothorax. No cardiomegaly. No rib fractures identified. Diffuse osteopenia, limiting evaluation. IMPRESSION: 1. No displaced left rib fractures identified. No pneumothorax or pleural effusions. 2. Nodular right hilar opacity, favored to represent prominent vasculature. However, difficult to exclude right hilar pulmonary nodule. Chest CT with IV contrast is recommended for further evaluation as clinically indicated. These results will be called to the ordering clinician or representative by the Radiologist Assistant, and communication documented in the PACS or [employer]. Electronically Signed   By: Michaeline Blanch M.D.   On: 01/19/2024 12:16   DG Wrist Complete Left Result Date: 01/19/2024 CLINICAL DATA:  Fall on outstretched arm, left wrist pain with deformity EXAM: LEFT WRIST - COMPLETE 3+ VIEW COMPARISON:  None Available. FINDINGS: Impacted distal radius fracture with dorsal angulation and likely nondisplaced, impacted fracture of the distal ulna and ulnar styloid process IMPRESSION: Impacted distal radius fracture with dorsal angulation. Minimally displaced distal ulna and ulnar  styloid process fracture.  Electronically Signed   By: Michaeline Blanch M.D.   On: 01/19/2024 12:11      HISTORY:   Past Medical History:  Diagnosis Date   Abnormal gait 06/07/2009   Last Assessment & Plan:  She saw specialist who has given her orders for PT and she is wanting to have it setup for her at [facility] where she already is receiving care from at home   Acid reflux 02/20/2014   Aftercare following surgery 11/27/2015   Anemia of unknown etiology 10/18/2016   Anxiety 02/20/2014   Atrial fibrillation (HCC)    Bilateral bunions 11/15/2020   CAD (coronary artery disease)    Carotid aneurysm, right 09/11/2022   2mm. observe [facility] 2024     Cerebral ischemia 03/11/2016   Overview:  Microvascular ischemia seen on MRI 01/2015 with partial empty sella turcica neurology appt 03/12/2016 pending with [facility], also [facility] with PT coming to the house for her gait abnormality and lumbar DDD, as well as local ortho (durranni)    Chest pain 11/03/2018   CHF (congestive heart failure) (HCC) 06/24/2015   Last Assessment & Plan:  Relevant Hx: Course: Daily Update: Today's Plan:she has been doing well overall from her heart and no SOB  Electronically signed by: Eleanor Merlynn Lady, NP 06/26/15 1522   Chronic diastolic heart failure (HCC) 06/24/2015   Last Assessment & Plan:  Relevant Hx: Course: Daily Update: Today's Plan:she has been doing well overall from her heart and no SOB  Electronically signed by: Eleanor Merlynn Lady, NP 06/26/15 1522   Chronic low back pain 02/03/2022   Chronic UTI 06/27/2015   Last Assessment & Plan:  She requested a UA be checked for her as she was having more frequency than before and it has been awhile since she was on oral abx for this, she is on low dose maintenance med to prevent and has done well with that.   CKD stage 3 due to type 2 diabetes mellitus (HCC) 02/24/2018   CKD stage 3a, GFR 45-59 ml/min (HCC) 02/24/2018   Colon polyps    COPD  (chronic obstructive pulmonary disease) (HCC) 03/09/2012   Last Assessment & Plan:  She feels she is stable from this discussed with her the allergies she has and she is going to discuss with Dr. Mardee having allergy injections as well for this    Diabetes (HCC) 02/20/2014   Dizziness 02/07/2016   Overview:  Chronic and has been having falls, MRI showing chronic microvascular ischemia, as well as partial sella turcica pending neuro appt for her with [facility]   Dyslipidemia 07/29/2015   Edema 06/24/2015   Last Assessment & Plan:  Relevant Hx: Course: Daily Update: Today's Plan:this is stable for her overall and will follow for her  Electronically signed by: Eleanor Merlynn Lady, NP 06/27/15 0813   Elevated cholesterol    Essential hypertension 03/09/2012   Last Assessment & Plan:  On repeat this was stable for her and she does not check it at home much but when has been checked has been fine for her   Fistula 02/20/2014   Gastroesophageal reflux disease without esophagitis 02/20/2014   Last Assessment & Plan:  Relevant Hx: Course: Daily Update: Today's Plan:not on meds for this as she has had issues with her arms cramping and she is going to just drink buttermilk  Electronically signed by: Eleanor Merlynn Lady, NP 06/26/15 1530   Generalized abdominal pain 10/24/2016   Hallux valgus of left foot 12/04/2015  Hallux valgus of right foot 12/04/2015   High risk medication use 06/24/2015   History of cancer 03/09/2012   Initial excision 03/18/2000 (Dr. Darrow Stank [facility]), 8 x 6   x 3 cm.  Local recurrence, re-excision 03/23/2007 [facility]), 5 x 3 x 2 cm.  Surveillance plan: baseline MRI April 2014     History of COVID-19 12/16/2019   11/2019 - Quarantined at home     History of recurrent UTIs 06/15/2019   Hx of valvular heart disease 01/29/2016   Hypertension 02/20/2014   Hypertensive heart disease with heart failure (HCC) 03/09/2012   Last Assessment & Plan:  On repeat this  was stable for her and she does not check it at home much but when has been checked has been fine for her   Hypokalemia 06/21/2020   Hyponatremia 08/23/2022   Hypothyroidism (acquired) 03/09/2012   Last Assessment & Plan:  She was due for this to be updated and have advised her to get this drawn while here today   IBS (irritable bowel syndrome) 06/24/2015   Last Assessment & Plan:  Relevant Hx: Course: Daily Update: Today's Plan:this is stable for her  Electronically signed by: Eleanor Merlynn Lady, NP 06/26/15 1531   IBS (irritable bowel syndrome)    Idiopathic progressive polyneuropathy 06/07/2009   Iron deficiency anemia 02/20/2014   Late onset Alzheimer's dementia without behavioral disturbance (HCC) 10/06/2019   Left bundle branch block 06/24/2015   Leukocytosis 06/24/2015   Last Assessment & Plan:  Relevant Hx: Course: Daily Update: Today's Plan:this has been stable for her with FU through hematology who felt she was having benign swings in this and she chose to not have bone marrow biopsy done   Electronically signed by: Eleanor Merlynn Lady, NP 06/27/15 [phone]   Liposarcoma, 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 Darrow Stank [facility]), 8 x 6 x 3 cm. Local recurrence, re-excision 03/23/2007 [facility]), 5 x 3 x 2 cm. Surveillance plan: baseline MRI April 2014  Last Assessment & Plan:  Relevant Hx: Course: Daily Update: Today's Plan:she is stable from this  Electronically signed by: Eleanor Merlynn Lady, NP 06/26/15 1533   Lumbar degenerative disc disease 11/06/2015   Last Assessment & Plan:  At this point she needs updated MRI of her lumbar spine and we discussed with her gait abnormality she is likely going to have to see specialist at [facility] for this, she has seen local neuro for this several years ago in terms of her neuropathy and her falls and he really did not address it . She was advised several years ago to have  back surgery locally which we did not feel was ideal with her sugars at the time being so out of control and now she is really more in control than then and it may be doable for her if felt  Needed though she would have to have cardiology clear her as well.   Mild CAD 07/29/2015   Overview:  Overview:  Cath march 2016 with 60% LAD with normal FFR   Overview:  Cath march 2016 with 60% LAD with normal FFR    Mixed hyperlipidemia 06/24/2015   Last Assessment & Plan:  Relevant Hx: Course: Daily Update: Today's Plan:update her lipids for her today and she is trying to watch her diet   Electronically signed by: Eleanor Merlynn Lady, NP 06/27/15 0813   Obstructive sleep apnea syndrome 06/24/2015   Onychomycosis due to dermatophyte 11/27/2015  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   Last Assessment & Plan:  Relevant Hx: Course: Daily Update: Today's Plan:she is doing well overall  Electronically signed by: Eleanor Merlynn Lady, NP 06/26/15 1531   Pancreatitis    Pre-ulcerative calluses 12/04/2015   Pre-ulcerative corn or callous 11/27/2015   Primary osteoarthritis involving multiple joints 07/06/2015   Last Assessment & Plan:  Relevant Hx: Course: Daily Update: Today's Plan:she has DDD of her back with some spinal  Stenosis that she has seen ortho locally for and he wanted to do surgery which she has avoided and I agree with, she is going to use the flexeril  for this right now as she is having more muscle spasm than what she has had in the past from her attempt to walk down some very steep steps that she was not used to . She should use her walker for support through the weekend and avoid overhead strain and lifting and stay in touch if not better.  Electronically signed by: Eleanor Merlynn Lady, NP 07/06/15 [phone]   Primary osteoarthritis of left hip 03/20/2020   Seasonal allergic rhinitis 01/12/2019   Sleep apnea    Spinal stenosis of lumbar region with neurogenic claudication 06/20/2020   Added  automatically from request for surgery [id number]     Stress incontinence of urine 04/17/2016   Syncope and collapse 10/13/2016   Thyroid  disease    TIA (transient ischemic attack) 10/13/2016   Type 2 diabetes mellitus without complication, without long-term current use of insulin  (HCC) 03/09/2012   Last Assessment & Plan:  She brings in readings of her sugars which show she has been doing much better overall with this at home and quite pleased with her   UTI (urinary tract infection)    Vascular calcification 01/29/2016   Visual changes 01/29/2016   Overview:  Following with Dr. Hugh as well as had recent carotid doppler showing minimal plaque present and vertebral arteries normal , nothing signficant   Vitamin B12 deficiency 12/04/2015    Past Surgical History:  Procedure Laterality Date   ABDOMINAL HYSTERECTOMY     CHOLECYSTECTOMY     EYE SURGERY     PANCREAS SURGERY     skin cancer lesion      Family History  Problem Relation Age of Onset   Diabetes Mother    Hypertension Mother    Cancer Sister    Hypertension Father     Social History:  reports that she has never smoked. She has been exposed to tobacco smoke. Her smokeless tobacco use includes snuff. She reports that she does not drink alcohol and does not use drugs.The patient is accompanied by daughter today.  Allergies:  Allergies  Allergen Reactions   Doxycycline Nausea And Vomiting and Other (See Comments)    GI Intolerance   Nitrofurantoin Nausea And Vomiting    Cramps, GI Upset, Fever, weakness, nausea    Statins Other (See Comments)     pancreatitis   Sulfamethoxazole  Rash   Trimethoprim  Rash    Current Medications: Current Outpatient Medications  Medication Sig Dispense Refill   Accu-Chek FastClix Lancets MISC 1 Lancet by Other route 3 (three) times daily.     albuterol (PROVENTIL) (2.5 MG/3ML) 0.083% nebulizer solution Take 2.5 mg by nebulization every 4 (four) hours as needed for wheezing or  shortness of breath.     albuterol (VENTOLIN HFA) 108 (90 Base) MCG/ACT inhaler Inhale 2 puffs into the lungs every 4 (four) hours as needed for wheezing or shortness of breath.  Alirocumab  (PRALUENT ) 150 MG/ML SOAJ INJECT THE CONTENTS OF 1 PEN  SUBCUTANEOUSLY EVERY 2 WEEKS 6 mL 2   ALPRAZolam  (XANAX ) 0.5 MG tablet Take 0.5 mg by mouth 3 (three) times daily as needed for anxiety.      aspirin  EC 81 MG tablet Take 81 mg by mouth daily.     benazepril  (LOTENSIN ) 20 MG tablet Take 20 mg by mouth daily.  5   cephALEXin  (KEFLEX ) 250 MG capsule Take 250 mg by mouth daily.     cyanocobalamin  (,VITAMIN B-12,) 1000 MCG/ML injection Inject into the muscle every 30 (thirty) days.     donepezil  (ARICEPT ) 5 MG tablet Take 5 mg by mouth at bedtime.  5   fluticasone  (FLONASE ) 50 MCG/ACT nasal spray Place 1 spray into the nose 2 (two) times daily.     furosemide  (LASIX ) 40 MG tablet Take 1 tablet (40 mg total) by mouth 2 (two) times daily. 180 tablet 3   gabapentin  (NEURONTIN ) 300 MG capsule Take 300 mg by mouth daily.  11   glimepiride  (AMARYL ) 1 MG tablet Take 0.5 tablets by mouth in the morning and at bedtime.   4   insulin  glargine (LANTUS ) 100 UNIT/ML Solostar Pen Inject 10-40 Units into the skin at bedtime. Sliding scale     isosorbide  mononitrate (IMDUR ) 30 MG 24 hr tablet Take 1 tablet (30 mg total) by mouth daily. 90 tablet 3   levothyroxine  (SYNTHROID , LEVOTHROID) 75 MCG tablet Take 75 mcg by mouth daily.  2   loratadine  (CLARITIN ) 10 MG tablet Take 10 mg by mouth daily as needed for allergies.     metFORMIN  (GLUCOPHAGE -XR) 500 MG 24 hr tablet Take 500 mg by mouth 2 (two) times daily.  2   metoprolol  tartrate (LOPRESSOR ) 50 MG tablet Take 50 mg by mouth 2 (two) times daily.     montelukast  (SINGULAIR ) 10 MG tablet Take 10 mg by mouth at bedtime.  3   NITRO-BID  2 % ointment APPLY 1 INCH TOPICALLY AT  BEDTIME 90 g 1   nitroGLYCERIN  (NITROSTAT ) 0.4 MG SL tablet DISSOLVE 1 TABLET UNDER THE  TONGUE  EVERY 5 MINUTES AS NEEDED FOR CHEST PAIN. MAX OF 3 TABLETS IN 15 MINUTES. CALL 911 IF PAIN  PERSISTS. (Patient taking differently: 0.4 mg every 5 (five) minutes as needed.) 50 tablet 7   nystatin (MYCOSTATIN/NYSTOP) powder Apply 1 Application topically 2 (two) times daily.     nystatin cream (MYCOSTATIN) Apply 1 Application topically 2 (two) times daily.     SYMBICORT  160-4.5 MCG/ACT inhaler Inhale 2 puffs into the lungs 2 (two) times daily.  2   BD PEN NEEDLE MICRO U/F 32G X 6 MM MISC 10-12 Units by Other route at bedtime.     Blood Glucose Monitoring Suppl (ACCU-CHEK AVIVA PLUS) w/Device KIT See admin instructions.  0   Continuous Glucose Receiver (DEXCOM G7 RECEIVER) DEVI 1 Device by Other route as needed.     Continuous Glucose Sensor (DEXCOM G7 SENSOR) MISC 1 Device by Other route daily.     No current facility-administered medications for this visit.     ASSESSMENT & PLAN:   Assessment:  Wendy Leon is a 89 y.o. female with history of iron deficiency anemia who has received iron infusions twice in the past with Dr. Ezzard and tolerated well. After receiving iron infusions, she is here for repeat evaluation. Hemoglobin today is up to 10.8 from 8.3. She states feeling better. Of note, she did suffer a fall in which she  fractured her left arm. It is currently in a cast and, according to her orthopedic doctor at her visit today, is healing nicely and will have cast removed in a few weeks.   Plan: 1.  Return to clinic if needed, if not, will repeat evaluation in 3 months.   I discussed the assessment and treatment plan with the patient.  The patient was provided an opportunity to ask questions and all were answered.  The patient agreed with the plan and demonstrated an understanding of the instructions.    Thank you for the referral    20 minutes was spent in patient care.  This included time spent preparing to see the patient (e.g., review of tests), obtaining and/or reviewing separately  obtained history, counseling and educating the patient/family/caregiver, ordering medications, tests, or procedures; documenting clinical information in the electronic or other health record, independently interpreting results and communicating results to the patient/family/caregiver as well as coordination of care.      Eleanor DELENA Bach, NP   Family Nurse Practitioner - Board Certified [facility] [phone]

## 2024-02-09 ENCOUNTER — Telehealth: Payer: Self-pay | Admitting: Hematology and Oncology

## 2024-02-09 NOTE — Telephone Encounter (Signed)
 Patient has been scheduled for follow-up visit per 02/08/24 LOS.  Pt noted appt details on personal planner/calendar.

## 2024-03-03 ENCOUNTER — Other Ambulatory Visit: Payer: Self-pay | Admitting: Cardiology

## 2024-03-04 ENCOUNTER — Other Ambulatory Visit: Payer: Self-pay | Admitting: Cardiology

## 2024-05-04 ENCOUNTER — Encounter: Payer: Self-pay | Admitting: Hematology and Oncology

## 2024-05-09 ENCOUNTER — Other Ambulatory Visit: Payer: Self-pay | Admitting: Hematology and Oncology

## 2024-05-10 ENCOUNTER — Inpatient Hospital Stay

## 2024-05-10 ENCOUNTER — Inpatient Hospital Stay: Attending: Hematology and Oncology | Admitting: Hematology and Oncology

## 2024-05-10 DIAGNOSIS — Z79899 Other long term (current) drug therapy: Secondary | ICD-10-CM | POA: Insufficient documentation

## 2024-05-10 DIAGNOSIS — Z7982 Long term (current) use of aspirin: Secondary | ICD-10-CM | POA: Insufficient documentation

## 2024-05-10 DIAGNOSIS — D509 Iron deficiency anemia, unspecified: Secondary | ICD-10-CM | POA: Insufficient documentation

## 2024-05-10 DIAGNOSIS — E039 Hypothyroidism, unspecified: Secondary | ICD-10-CM | POA: Insufficient documentation

## 2024-05-12 ENCOUNTER — Telehealth: Payer: Self-pay | Admitting: Cardiology

## 2024-05-12 ENCOUNTER — Encounter: Payer: Self-pay | Admitting: Hematology and Oncology

## 2024-05-12 NOTE — Telephone Encounter (Signed)
 Pt c/o medication issue:  1. Name of Medication:   PRALUENT  150 MG/ML SOAJ  NITRO-BID  2 % ointment    2. How are you currently taking this medication (dosage and times per day)? As written   3. Are you having a reaction (difficulty breathing--STAT)? No   4. What is your medication issue? Pt requesting prior auth for both medications. The pts daughter would also like the pt to stop taking the Praluent  and start Repata due to insurance coverage

## 2024-05-16 ENCOUNTER — Encounter: Payer: Self-pay | Admitting: Hematology and Oncology

## 2024-05-16 ENCOUNTER — Other Ambulatory Visit (HOSPITAL_COMMUNITY): Payer: Self-pay

## 2024-05-16 ENCOUNTER — Telehealth: Payer: Self-pay | Admitting: Pharmacy Technician

## 2024-05-16 ENCOUNTER — Other Ambulatory Visit: Payer: Self-pay | Admitting: Hematology and Oncology

## 2024-05-16 DIAGNOSIS — D649 Anemia, unspecified: Secondary | ICD-10-CM

## 2024-05-16 NOTE — Telephone Encounter (Signed)
 SABRA

## 2024-05-16 NOTE — Telephone Encounter (Signed)
 Pharmacy Patient Advocate Encounter  Received notification from [employer] that Prior Authorization for repatha  has been APPROVED from 04/28/24 to 04/27/25. Ran test claim, Copay is $12.65- one month. This test claim was processed through [facility]- copay amounts may vary at other pharmacies due to pharmacy/plan contracts, or as the patient moves through the different stages of their insurance plan.   PA #/Case ID/Reference #: [id number]

## 2024-05-16 NOTE — Telephone Encounter (Addendum)
 Pharmacy Patient Advocate Encounter   Received notification from Physician's Office that prior authorization for repatha  is required/requested.   Insurance verification completed.   The patient is insured through [employer].   Per test claim: PA required; PA submitted to above mentioned insurance via Latent Key/confirmation #/EOC [id number] Status is pending

## 2024-05-17 ENCOUNTER — Inpatient Hospital Stay: Admitting: Hematology and Oncology

## 2024-05-17 ENCOUNTER — Inpatient Hospital Stay

## 2024-05-17 ENCOUNTER — Other Ambulatory Visit: Payer: Self-pay

## 2024-05-17 DIAGNOSIS — Z79899 Other long term (current) drug therapy: Secondary | ICD-10-CM | POA: Diagnosis not present

## 2024-05-17 DIAGNOSIS — Z7982 Long term (current) use of aspirin: Secondary | ICD-10-CM | POA: Diagnosis not present

## 2024-05-17 DIAGNOSIS — D509 Iron deficiency anemia, unspecified: Secondary | ICD-10-CM | POA: Diagnosis present

## 2024-05-17 DIAGNOSIS — D649 Anemia, unspecified: Secondary | ICD-10-CM

## 2024-05-17 DIAGNOSIS — E039 Hypothyroidism, unspecified: Secondary | ICD-10-CM | POA: Diagnosis not present

## 2024-05-17 LAB — CMP (CANCER CENTER ONLY)
ALT: 24 U/L (ref 0–44)
AST: 26 U/L (ref 15–41)
Albumin: 4.3 g/dL (ref 3.5–5.0)
Alkaline Phosphatase: 63 U/L (ref 38–126)
Anion gap: 16 — ABNORMAL HIGH (ref 5–15)
BUN: 33 mg/dL — ABNORMAL HIGH (ref 8–23)
CO2: 26 mmol/L (ref 22–32)
Calcium: 9.6 mg/dL (ref 8.9–10.3)
Chloride: 95 mmol/L — ABNORMAL LOW (ref 98–111)
Creatinine: 1.43 mg/dL — ABNORMAL HIGH (ref 0.44–1.00)
GFR, Estimated: 35 mL/min — ABNORMAL LOW
Glucose, Bld: 208 mg/dL — ABNORMAL HIGH (ref 70–99)
Potassium: 3.5 mmol/L (ref 3.5–5.1)
Sodium: 137 mmol/L (ref 135–145)
Total Bilirubin: 0.3 mg/dL (ref 0.0–1.2)
Total Protein: 7.3 g/dL (ref 6.5–8.1)

## 2024-05-17 LAB — VITAMIN B12: Vitamin B-12: 603 pg/mL (ref 180–914)

## 2024-05-17 LAB — CBC WITH DIFFERENTIAL (CANCER CENTER ONLY)
Abs Immature Granulocytes: 0.15 K/uL — ABNORMAL HIGH (ref 0.00–0.07)
Basophils Absolute: 0 K/uL (ref 0.0–0.1)
Basophils Relative: 0 %
Eosinophils Absolute: 0 K/uL (ref 0.0–0.5)
Eosinophils Relative: 0 %
HCT: 33.7 % — ABNORMAL LOW (ref 36.0–46.0)
Hemoglobin: 11.1 g/dL — ABNORMAL LOW (ref 12.0–15.0)
Immature Granulocytes: 1 %
Lymphocytes Relative: 23 %
Lymphs Abs: 4.1 K/uL — ABNORMAL HIGH (ref 0.7–4.0)
MCH: 29.6 pg (ref 26.0–34.0)
MCHC: 32.9 g/dL (ref 30.0–36.0)
MCV: 89.9 fL (ref 80.0–100.0)
Monocytes Absolute: 1.5 K/uL — ABNORMAL HIGH (ref 0.1–1.0)
Monocytes Relative: 8 %
Neutro Abs: 12.2 K/uL — ABNORMAL HIGH (ref 1.7–7.7)
Neutrophils Relative %: 68 %
Platelet Count: 418 K/uL — ABNORMAL HIGH (ref 150–400)
RBC: 3.75 MIL/uL — ABNORMAL LOW (ref 3.87–5.11)
RDW: 14.4 % (ref 11.5–15.5)
WBC Count: 18 K/uL — ABNORMAL HIGH (ref 4.0–10.5)
nRBC: 0 % (ref 0.0–0.2)

## 2024-05-17 LAB — IRON AND TIBC
Iron: 115 ug/dL (ref 28–170)
Saturation Ratios: 32 % — ABNORMAL HIGH (ref 10.4–31.8)
TIBC: 356 ug/dL (ref 250–450)
UIBC: 241 ug/dL

## 2024-05-17 LAB — FOLATE: Folate: 10.3 ng/mL

## 2024-05-17 LAB — TSH: TSH: 0.988 u[IU]/mL (ref 0.350–4.500)

## 2024-05-17 LAB — FERRITIN: Ferritin: 86 ng/mL (ref 11–307)

## 2024-05-17 MED ORDER — REPATHA SURECLICK 140 MG/ML ~~LOC~~ SOAJ: 140.0000 mg | SUBCUTANEOUS | 3 refills | Status: AC

## 2024-05-17 MED ORDER — NITRO-BID 2 % TD OINT: 1.0000 [in_us] | TOPICAL_OINTMENT | Freq: Every evening | TRANSDERMAL | 2 refills | Status: AC

## 2024-05-17 NOTE — Progress Notes (Signed)
 " [facility] [address] [place],  [place]  [address] [phone]  Clinic Day:  05/17/2024   Referring physician: Thurmond Cathlyn LABOR., MD  Patient Care Team: Patient Care Team: Thurmond Cathlyn LABOR., MD as PCP - General (Internal Medicine) Monetta Redell PARAS, MD as PCP - Cardiology (Cardiology) Georgina Elgin Isela DOUGLAS, MD as Referring Physician (Orthopedic Surgery) Carlin Delon BROCKS, NP as Nurse Practitioner (Cardiology)   REASON FOR CONSULTATION:  Anemia  HISTORY OF PRESENT ILLNESS:   Wendy Leon is a 89 y.o. female with a history of anemia who is referred in consultation by Cathlyn Thurmond, MD for assessment and management. She has a long history of iron deficient anemia and has received IV iron in the past with Dr. Ezzard. As she does have stage 3b chronic kidney disease, this could be a factor in her anemia. She notes increasing fatigue, shortness of breath, feeling cold, and restlessness. Most recent CBC on 08/28 revealed hemoglobin 9.0. She denies chest pain or cough. She denies issue with bowel or bladder. She denies changes in appetite or weight loss.   Medical, surgical and social history included below.     REVIEW OF SYSTEMS:   Review of Systems  Constitutional:  Positive for fatigue.  HENT:  Negative.    Eyes: Negative.   Respiratory:  Positive for shortness of breath.   Cardiovascular: Negative.   Gastrointestinal: Negative.   Endocrine: Negative.   Genitourinary: Negative.    Musculoskeletal:  Positive for arthralgias and myalgias.  Skin: Negative.   Neurological:  Positive for extremity weakness and light-headedness.  Hematological: Negative.   Psychiatric/Behavioral: Negative.       VITALS:   There were no vitals taken for this visit.  Wt Readings from Last 3 Encounters:  12/31/23 165 lb 6.4 oz (75 kg)  12/22/23 166 lb (75.3 kg)  06/02/23 158 lb 6.4 oz (71.8 kg)    There is no height or weight on file to calculate BMI.  Performance status  (ECOG): 2 - Symptomatic, <50% confined to bed  PHYSICAL EXAM:   Physical Exam Constitutional:      Appearance: Normal appearance. She is normal weight.  HENT:     Head: Normocephalic and atraumatic.     Mouth/Throat:     Mouth: Mucous membranes are dry.  Cardiovascular:     Rate and Rhythm: Normal rate and regular rhythm.     Pulses: Normal pulses.     Heart sounds: Normal heart sounds.  Pulmonary:     Effort: Pulmonary effort is normal.     Breath sounds: Normal breath sounds.  Abdominal:     General: Bowel sounds are normal.     Palpations: Abdomen is soft.  Musculoskeletal:        General: Normal range of motion.     Cervical back: Normal range of motion.  Skin:    General: Skin is warm and dry.  Neurological:     General: No focal deficit present.     Mental Status: She is alert and oriented to person, place, and time. Mental status is at baseline.  Psychiatric:        Mood and Affect: Mood normal.        Behavior: Behavior normal.        Thought Content: Thought content normal.        Judgment: Judgment normal.      LABS:      Latest Ref Rng & Units 05/17/2024   11:12 AM  02/08/2024    1:01 PM 12/31/2023    2:27 PM  CBC  WBC 4.0 - 10.5 K/uL 18.0  12.2  15.8   Hemoglobin 12.0 - 15.0 g/dL 88.8  89.1  8.3   Hematocrit 36.0 - 46.0 % 33.7  33.4  25.7   Platelets 150 - 400 K/uL 418  421  672       Latest Ref Rng & Units 02/08/2024    1:01 PM 12/31/2023    2:27 PM 08/24/2022    2:31 AM  CMP  Glucose 70 - 99 mg/dL 842  877  856   BUN 8 - 23 mg/dL 23  33  28   Creatinine 0.44 - 1.00 mg/dL 8.62  8.61  8.76   Sodium 135 - 145 mmol/L 132  132  134   Potassium 3.5 - 5.1 mmol/L 3.6  4.0  3.4   Chloride 98 - 111 mmol/L 93  96  94   CO2 22 - 32 mmol/L 24  20  26    Calcium 8.9 - 10.3 mg/dL 9.6  9.4  9.6   Total Protein 6.5 - 8.1 g/dL 7.4  7.5  7.1   Total Bilirubin 0.0 - 1.2 mg/dL 0.3  <9.7  0.7   Alkaline Phos 38 - 126 U/L 64  61  47   AST 15 - 41 U/L 30  28  21     ALT 0 - 44 U/L 20  18  22       No results found for: CEA1, CEA / No results found for: CEA1, CEA No results found for: PSA1 No results found for: CAN199 No results found for: RJW874  Lab Results  Component Value Date   TOTALPROTELP 7.0 12/31/2023   Lab Results  Component Value Date   TIBC 308 02/08/2024   TIBC 491 (H) 12/31/2023   FERRITIN 366 (H) 02/08/2024   FERRITIN 9 (L) 12/31/2023   IRONPCTSAT 33 (H) 02/08/2024   IRONPCTSAT 4 (L) 12/31/2023   No results found for: LDH  STUDIES:   No results found.     HISTORY:   Past Medical History:  Diagnosis Date   Abnormal gait 06/07/2009   Last Assessment & Plan:  She saw specialist who has given her orders for PT and she is wanting to have it setup for her at [facility] where she already is receiving care from at home   Acid reflux 02/20/2014   Aftercare following surgery 11/27/2015   Anemia of unknown etiology 10/18/2016   Anxiety 02/20/2014   Atrial fibrillation (HCC)    Bilateral bunions 11/15/2020   CAD (coronary artery disease)    Carotid aneurysm, right 09/11/2022   2mm. observe [facility] 2024     Cerebral ischemia 03/11/2016   Overview:  Microvascular ischemia seen on MRI 01/2015 with partial empty sella turcica neurology appt 03/12/2016 pending with [facility], also [facility] with PT coming to the house for her gait abnormality and lumbar DDD, as well as local ortho (durranni)    Chest pain 11/03/2018   CHF (congestive heart failure) (HCC) 06/24/2015   Last Assessment & Plan:  Relevant Hx: Course: Daily Update: Today's Plan:she has been doing well overall from her heart and no SOB  Electronically signed by: Eleanor Merlynn Lady, NP 06/26/15 1522   Chronic diastolic heart failure (HCC) 06/24/2015   Last Assessment & Plan:  Relevant Hx: Course: Daily Update: Today's Plan:she has been doing well overall from her heart and no SOB  Electronically signed by:  Eleanor Merlynn Lady, NP 06/26/15  1522   Chronic low back pain 02/03/2022   Chronic UTI 06/27/2015   Last Assessment & Plan:  She requested a UA be checked for her as she was having more frequency than before and it has been awhile since she was on oral abx for this, she is on low dose maintenance med to prevent and has done well with that.   CKD stage 3 due to type 2 diabetes mellitus (HCC) 02/24/2018   CKD stage 3a, GFR 45-59 ml/min (HCC) 02/24/2018   Colon polyps    COPD (chronic obstructive pulmonary disease) (HCC) 03/09/2012   Last Assessment & Plan:  She feels she is stable from this discussed with her the allergies she has and she is going to discuss with Dr. Mardee having allergy injections as well for this    Diabetes (HCC) 02/20/2014   Dizziness 02/07/2016   Overview:  Chronic and has been having falls, MRI showing chronic microvascular ischemia, as well as partial sella turcica pending neuro appt for her with [facility]   Dyslipidemia 07/29/2015   Edema 06/24/2015   Last Assessment & Plan:  Relevant Hx: Course: Daily Update: Today's Plan:this is stable for her overall and will follow for her  Electronically signed by: Eleanor Merlynn Lady, NP 06/27/15 0813   Elevated cholesterol    Essential hypertension 03/09/2012   Last Assessment & Plan:  On repeat this was stable for her and she does not check it at home much but when has been checked has been fine for her   Fistula 02/20/2014   Gastroesophageal reflux disease without esophagitis 02/20/2014   Last Assessment & Plan:  Relevant Hx: Course: Daily Update: Today's Plan:not on meds for this as she has had issues with her arms cramping and she is going to just drink buttermilk  Electronically signed by: Eleanor Merlynn Lady, NP 06/26/15 1530   Generalized abdominal pain 10/24/2016   Hallux valgus of left foot 12/04/2015   Hallux valgus of right foot 12/04/2015   High risk medication use 06/24/2015   History of cancer 03/09/2012   Initial excision 03/18/2000  (Dr. Darrow Stank [facility]), 8 x 6   x 3 cm.  Local recurrence, re-excision 03/23/2007 Jil), 5 x 3 x 2 cm.  Surveillance plan: baseline MRI April 2014     History of COVID-19 12/16/2019   11/2019 - Quarantined at home     History of recurrent UTIs 06/15/2019   Hx of valvular heart disease 01/29/2016   Hypertension 02/20/2014   Hypertensive heart disease with heart failure (HCC) 03/09/2012   Last Assessment & Plan:  On repeat this was stable for her and she does not check it at home much but when has been checked has been fine for her   Hypokalemia 06/21/2020   Hyponatremia 08/23/2022   Hypothyroidism (acquired) 03/09/2012   Last Assessment & Plan:  She was due for this to be updated and have advised her to get this drawn while here today   IBS (irritable bowel syndrome) 06/24/2015   Last Assessment & Plan:  Relevant Hx: Course: Daily Update: Today's Plan:this is stable for her  Electronically signed by: Eleanor Merlynn Lady, NP 06/26/15 1531   IBS (irritable bowel syndrome)    Idiopathic progressive polyneuropathy 06/07/2009   Iron deficiency anemia 02/20/2014   Late onset Alzheimer's dementia without behavioral disturbance (HCC) 10/06/2019   Left bundle branch block 06/24/2015   Leukocytosis 06/24/2015   Last Assessment & Plan:  Relevant  Hx: Course: Daily Update: Today's Plan:this has been stable for her with FU through hematology who felt she was having benign swings in this and she chose to not have bone marrow biopsy done   Electronically signed by: Eleanor Merlynn Lady, NP 06/27/15 [phone]   Liposarcoma, 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 Darrow Stank [facility]), 8 x 6 x 3 cm. Local recurrence, re-excision 03/23/2007 Jil), 5 x 3 x 2 cm. Surveillance plan: baseline MRI April 2014  Last Assessment & Plan:  Relevant Hx: Course: Daily Update: Today's Plan:she is stable from this  Electronically signed by:  Eleanor Merlynn Lady, NP 06/26/15 1533   Lumbar degenerative disc disease 11/06/2015   Last Assessment & Plan:  At this point she needs updated MRI of her lumbar spine and we discussed with her gait abnormality she is likely going to have to see specialist at [facility] for this, she has seen local neuro for this several years ago in terms of her neuropathy and her falls and he really did not address it . She was advised several years ago to have back surgery locally which we did not feel was ideal with her sugars at the time being so out of control and now she is really more in control than then and it may be doable for her if felt  Needed though she would have to have cardiology clear her as well.   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   Last Assessment & Plan:  Relevant Hx: Course: Daily Update: Today's Plan:she is doing well overall  Electronically signed by: Eleanor Merlynn Lady, NP 06/26/15 1531   Pancreatitis    Pre-ulcerative calluses 12/04/2015   Pre-ulcerative corn or callous 11/27/2015   Primary osteoarthritis involving multiple joints 07/06/2015   Last Assessment & Plan:  Relevant Hx: Course: Daily Update: Today's Plan:she has DDD of her back with some spinal  Stenosis that she has seen ortho locally for and he wanted to do surgery which she has avoided and I agree with, she is going to use the flexeril for this right now as she is having more muscle spasm than what she has had in the past from her attempt  to walk down some very steep steps that she was not used to . She should use her walker for support through the weekend and avoid overhead strain and lifting and stay in touch if not better.  Electronically signed by: Eleanor Merlynn Lady, NP 07/06/15 [phone]   Primary osteoarthritis of left hip 03/20/2020   Seasonal allergic rhinitis 01/12/2019   Sleep apnea    Spinal stenosis of lumbar region with neurogenic claudication 06/20/2020   Added automatically from request for surgery [id number]     Stress incontinence of urine 04/17/2016   Syncope and collapse 10/13/2016   Thyroid  disease    TIA (transient ischemic attack) 10/13/2016   Type 2 diabetes mellitus without complication, without long-term current use of insulin  (HCC)  03/09/2012   Last Assessment & Plan:  She brings in readings of her sugars which show she has been doing much better overall with this at home and quite pleased with her   UTI (urinary tract infection)    Vascular calcification 01/29/2016   Visual changes 01/29/2016   Overview:  Following with Dr. Hugh as well as had recent carotid doppler showing minimal plaque present and vertebral arteries normal , nothing signficant   Vitamin B12 deficiency 12/04/2015    Past Surgical History:  Procedure Laterality Date   ABDOMINAL HYSTERECTOMY     CHOLECYSTECTOMY     EYE SURGERY     PANCREAS SURGERY     skin cancer lesion      Family History  Problem Relation Age of Onset   Diabetes Mother    Hypertension Mother    Cancer Sister    Hypertension Father     Social History:  reports that she has never smoked. She has been exposed to tobacco smoke. Her smokeless tobacco use includes snuff. She reports that she does not drink alcohol and does not use drugs.The patient is accompanied by daughter today.  Allergies:  Allergies  Allergen Reactions   Doxycycline Nausea And Vomiting and Other (See Comments)    GI Intolerance   Nitrofurantoin Nausea And Vomiting    Cramps,  GI Upset, Fever, weakness, nausea    Statins Other (See Comments)     pancreatitis   Sulfamethoxazole Rash   Trimethoprim Rash    Current Medications: Current Outpatient Medications  Medication Sig Dispense Refill   albuterol (PROVENTIL) (2.5 MG/3ML) 0.083% nebulizer solution Take 2.5 mg by nebulization every 4 (four) hours as needed for wheezing or shortness of breath.     albuterol (VENTOLIN HFA) 108 (90 Base) MCG/ACT inhaler Inhale 2 puffs into the lungs every 4 (four) hours as needed for wheezing or shortness of breath.     ALPRAZolam  (XANAX ) 0.5 MG tablet Take 0.5 mg by mouth 3 (three) times daily as needed for anxiety.      aspirin  EC 81 MG tablet Take 81 mg by mouth daily.     benazepril  (LOTENSIN ) 20 MG tablet Take 20 mg by mouth daily.  5   cephALEXin (KEFLEX) 250 MG capsule Take 250 mg by mouth daily.     cyanocobalamin  (,VITAMIN B-12,) 1000 MCG/ML injection Inject into the muscle every 30 (thirty) days.     donepezil  (ARICEPT ) 5 MG tablet Take 5 mg by mouth at bedtime.  5   Evolocumab  (REPATHA  SURECLICK) 140 MG/ML SOAJ Inject 140 mg into the skin every 14 (fourteen) days. 6 mL 3   fluticasone  (FLONASE ) 50 MCG/ACT nasal spray Place 1 spray into the nose 2 (two) times daily.     furosemide  (LASIX ) 40 MG tablet Take 1 tablet (40 mg total) by mouth 2 (two) times daily. 180 tablet 3   gabapentin  (NEURONTIN ) 300 MG capsule Take 300 mg by mouth daily.  11   glimepiride (AMARYL) 1 MG tablet Take 0.5 tablets by mouth in the morning and at bedtime.   4   insulin  glargine (LANTUS ) 100 UNIT/ML Solostar Pen Inject 10-40 Units into the skin at bedtime. Sliding scale     isosorbide  mononitrate (IMDUR ) 30 MG 24 hr tablet Take 1 tablet (30 mg total) by mouth daily. 90 tablet 3   levothyroxine  (SYNTHROID , LEVOTHROID) 75 MCG tablet Take 75 mcg by mouth daily.  2   loratadine  (CLARITIN ) 10 MG tablet Take 10 mg by mouth daily as  needed for allergies.     metFORMIN (GLUCOPHAGE-XR) 500 MG 24 hr  tablet Take 500 mg by mouth 2 (two) times daily.  2   metoprolol  tartrate (LOPRESSOR ) 50 MG tablet Take 50 mg by mouth 2 (two) times daily.     montelukast (SINGULAIR) 10 MG tablet Take 10 mg by mouth at bedtime.  3   NITRO-BID  2 % ointment Apply 1 inch topically at bedtime. 90 g 2   nitroGLYCERIN  (NITROSTAT ) 0.4 MG SL tablet DISSOLVE 1 TABLET UNDER THE  TONGUE EVERY 5 MINUTES AS NEEDED FOR CHEST PAIN. MAX OF 3 TABLETS IN 15 MINUTES. CALL 911 IF PAIN  PERSISTS. (Patient taking differently: 0.4 mg every 5 (five) minutes as needed.) 50 tablet 7   nystatin (MYCOSTATIN/NYSTOP) powder Apply 1 Application topically 2 (two) times daily.     nystatin cream (MYCOSTATIN) Apply 1 Application topically 2 (two) times daily.     Accu-Chek FastClix Lancets MISC 1 Lancet by Other route 3 (three) times daily.     BD PEN NEEDLE MICRO U/F 32G X 6 MM MISC 10-12 Units by Other route at bedtime.     Blood Glucose Monitoring Suppl (ACCU-CHEK AVIVA PLUS) w/Device KIT See admin instructions.  0   Continuous Glucose Receiver (DEXCOM G7 RECEIVER) DEVI 1 Device by Other route as needed.     Continuous Glucose Sensor (DEXCOM G7 SENSOR) MISC 1 Device by Other route daily.     SYMBICORT 160-4.5 MCG/ACT inhaler Inhale 2 puffs into the lungs 2 (two) times daily.  2   No current facility-administered medications for this visit.     ASSESSMENT & PLAN:   Assessment:  Wendy Leon is a 89 y.o. female with history of iron deficiency anemia who has received iron infusions twice in the past with Dr. Ezzard and tolerated well. After receiving iron infusions, she is here for repeat evaluation. Hemoglobin today is up to 11.1 from 10.8. She states feeling better. Her symptoms have resolved. Her cast was removed and she will remain in a wrist brace for 2 more weeks.   Plan: 1.  Return to clinic in 3 months.   I discussed the assessment and treatment plan with the patient.  The patient was provided an opportunity to ask questions and all  were answered.  The patient agreed with the plan and demonstrated an understanding of the instructions.    Thank you for the referral    20 minutes was spent in patient care.  This included time spent preparing to see the patient (e.g., review of tests), obtaining and/or reviewing separately obtained history, counseling and educating the patient/family/caregiver, ordering medications, tests, or procedures; documenting clinical information in the electronic or other health record, independently interpreting results and communicating results to the patient/family/caregiver as well as coordination of care.      Eleanor DELENA Bach, NP   Family Nurse Practitioner - Board Certified [facility]  [phone]     "

## 2024-05-17 NOTE — Telephone Encounter (Signed)
 Wendy Leon per DPR aware that Repatha  has been approved and sent to [facility].

## 2024-05-29 NOTE — Progress Notes (Unsigned)
 " Cardiology Office Note:    Date:  05/29/2024   ID:  Wendy Leon, DOB 01-29-1933, MRN [id number]  PCP:  Thurmond Cathlyn LABOR., MD  Cardiologist:  Redell Leiter, MD    Referring MD: Thurmond Cathlyn LABOR., MD    ASSESSMENT:    No diagnosis found. PLAN:    In order of problems listed above:  ***   Next appointment: ***   Medication Adjustments/Labs and Tests Ordered: Current medicines are reviewed at length with the patient today.  Concerns regarding medicines are outlined above.  No orders of the defined types were placed in this encounter.  No orders of the defined types were placed in this encounter.    History of Present Illness:    Wendy Leon is a 89 y.o. female with a hx of complex heart disease including CAD moderate proximal LAD stenosis treated medically due to several significant comorbidities and patient and family choice hypertensive heart disease with heart failure left bundle branch block symptomatic orthostatic hypotension with syncope hyperlipidemia with statin intolerance type 2 diabetes with stage III CKD and neuropathy last seen by me 06/30/2023.  She was seen by hematology 05/17/2024 with anemia  history of iron deficiency requiring IV iron administration in the past and stage IIIb CKD hemoglobin that day was 11.1 creatinine 1.37 sodium 132 potassium 3.6.  Lipid profile 03/29/2024 cholesterol 225 LDL 123 non-HDL cholesterol 106.  She had a cardiac echo performed 01/16/2024 left ventricle mild LVH normal size EF 60 to 65% grade 1 diastolic dysfunction however GLS was normal.  Compliance with diet, lifestyle and medications: *** Past Medical History:  Diagnosis Date   Abnormal gait 06/07/2009   Last Assessment & Plan:  She saw specialist who has given her orders for PT and she is wanting to have it setup for her at [facility] where she already is receiving care from at home   Acid reflux 02/20/2014   Aftercare following surgery 11/27/2015   Anemia of unknown etiology  10/18/2016   Anxiety 02/20/2014   Atrial fibrillation (HCC)    Bilateral bunions 11/15/2020   CAD (coronary artery disease)    Carotid aneurysm, right 09/11/2022   2mm. observe [facility] 2024     Cerebral ischemia 03/11/2016   Overview:  Microvascular ischemia seen on MRI 01/2015 with partial empty sella turcica neurology appt 03/12/2016 pending with [facility], also [facility] with PT coming to the house for her gait abnormality and lumbar DDD, as well as local ortho (durranni)    Chest pain 11/03/2018   CHF (congestive heart failure) (HCC) 06/24/2015   Last Assessment & Plan:  Relevant Hx: Course: Daily Update: Today's Plan:she has been doing well overall from her heart and no SOB  Electronically signed by: Eleanor Merlynn Lady, NP 06/26/15 1522   Chronic diastolic heart failure (HCC) 06/24/2015   Last Assessment & Plan:  Relevant Hx: Course: Daily Update: Today's Plan:she has been doing well overall from her heart and no SOB  Electronically signed by: Eleanor Merlynn Lady, NP 06/26/15 1522   Chronic low back pain 02/03/2022   Chronic UTI 06/27/2015   Last Assessment & Plan:  She requested a UA be checked for her as she was having more frequency than before and it has been awhile since she was on oral abx for this, she is on low dose maintenance med to prevent and has done well with that.   CKD stage 3 due to type 2 diabetes mellitus (HCC) 02/24/2018  CKD stage 3a, GFR 45-59 ml/min (HCC) 02/24/2018   Colon polyps    COPD (chronic obstructive pulmonary disease) (HCC) 03/09/2012   Last Assessment & Plan:  She feels she is stable from this discussed with her the allergies she has and she is going to discuss with Dr. Mardee having allergy injections as well for this    Diabetes (HCC) 02/20/2014   Dizziness 02/07/2016   Overview:  Chronic and has been having falls, MRI showing chronic microvascular ischemia, as well as partial sella turcica pending neuro appt for her with [facility]    Dyslipidemia 07/29/2015   Edema 06/24/2015   Last Assessment & Plan:  Relevant Hx: Course: Daily Update: Today's Plan:this is stable for her overall and will follow for her  Electronically signed by: Eleanor Merlynn Lady, NP 06/27/15 0813   Elevated cholesterol    Essential hypertension 03/09/2012   Last Assessment & Plan:  On repeat this was stable for her and she does not check it at home much but when has been checked has been fine for her   Fistula 02/20/2014   Gastroesophageal reflux disease without esophagitis 02/20/2014   Last Assessment & Plan:  Relevant Hx: Course: Daily Update: Today's Plan:not on meds for this as she has had issues with her arms cramping and she is going to just drink buttermilk  Electronically signed by: Eleanor Merlynn Lady, NP 06/26/15 1530   Generalized abdominal pain 10/24/2016   Hallux valgus of left foot 12/04/2015   Hallux valgus of right foot 12/04/2015   High risk medication use 06/24/2015   History of cancer 03/09/2012   Initial excision 03/18/2000 (Dr. Darrow Stank [facility]), 8 x 6   x 3 cm.  Local recurrence, re-excision 03/23/2007 [facility]), 5 x 3 x 2 cm.  Surveillance plan: baseline MRI April 2014     History of COVID-19 12/16/2019   11/2019 - Quarantined at home     History of recurrent UTIs 06/15/2019   Hx of valvular heart disease 01/29/2016   Hypertension 02/20/2014   Hypertensive heart disease with heart failure (HCC) 03/09/2012   Last Assessment & Plan:  On repeat this was stable for her and she does not check it at home much but when has been checked has been fine for her   Hypokalemia 06/21/2020   Hyponatremia 08/23/2022   Hypothyroidism (acquired) 03/09/2012   Last Assessment & Plan:  She was due for this to be updated and have advised her to get this drawn while here today   IBS (irritable bowel syndrome) 06/24/2015   Last Assessment & Plan:  Relevant Hx: Course: Daily Update: Today's Plan:this is stable for her   Electronically signed by: Eleanor Merlynn Lady, NP 06/26/15 1531   IBS (irritable bowel syndrome)    Idiopathic progressive polyneuropathy 06/07/2009   Iron deficiency anemia 02/20/2014   Late onset Alzheimer's dementia without behavioral disturbance (HCC) 10/06/2019   Left bundle branch block 06/24/2015   Leukocytosis 06/24/2015   Last Assessment & Plan:  Relevant Hx: Course: Daily Update: Today's Plan:this has been stable for her with FU through hematology who felt she was having benign swings in this and she chose to not have bone marrow biopsy done   Electronically signed by: Eleanor Merlynn Lady, NP 06/27/15 [phone]   Liposarcoma, 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 Darrow Stank [facility]), 8 x 6 x 3 cm. Local recurrence, re-excision 03/23/2007 J[facility]), 5 x 3 x 2 cm. Surveillance  plan: baseline MRI April 2014  Last Assessment & Plan:  Relevant Hx: Course: Daily Update: Today's Plan:she is stable from this  Electronically signed by: Eleanor Merlynn Lady, NP 06/26/15 1533   Lumbar degenerative disc disease 11/06/2015   Last Assessment & Plan:  At this point she needs updated MRI of her lumbar spine and we discussed with her gait abnormality she is likely going to have to see specialist at [facility] for this, she has seen local neuro for this several years ago in terms of her neuropathy and her falls and he really did not address it . She was advised several years ago to have back surgery locally which we did not feel was ideal with her sugars at the time being so out of control and now she is really more in control than then and it may be doable for her if felt  Needed though she would have to have cardiology clear her as well.   Mild CAD 07/29/2015   Overview:  Overview:  Cath march 2016 with 60% LAD with normal FFR   Overview:  Cath march 2016 with 60% LAD with normal FFR    Mixed hyperlipidemia 06/24/2015   Last  Assessment & Plan:  Relevant Hx: Course: Daily Update: Today's Plan:update her lipids for her today and she is trying to watch her diet   Electronically signed by: Eleanor Merlynn Lady, NP 06/27/15 0813   Obstructive sleep apnea syndrome 06/24/2015   Onychomycosis due to dermatophyte 11/27/2015   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   Last Assessment & Plan:  Relevant Hx: Course: Daily Update: Today's Plan:she is doing well overall  Electronically signed by: Eleanor Merlynn Lady, NP 06/26/15 1531   Pancreatitis    Pre-ulcerative calluses 12/04/2015   Pre-ulcerative corn or callous 11/27/2015   Primary osteoarthritis involving multiple joints 07/06/2015   Last Assessment & Plan:  Relevant Hx: Course: Daily Update: Today's Plan:she has DDD of her back with some spinal  Stenosis that she has seen ortho locally for and he wanted to do surgery which she has avoided and I agree with, she is going to use the flexeril for this right now as she is having more muscle spasm than what she has had in the past from her attempt to walk down some very steep steps that she was not used to . She should use her walker for support through the weekend and avoid overhead strain and lifting and stay in touch if not better.  Electronically signed by: Eleanor Merlynn Lady, NP 07/06/15 [phone]   Primary osteoarthritis of left hip 03/20/2020   Seasonal allergic rhinitis 01/12/2019   Sleep apnea    Spinal stenosis of lumbar region with neurogenic claudication 06/20/2020   Added automatically from request for surgery [id number]     Stress incontinence of urine 04/17/2016   Syncope and collapse 10/13/2016   Thyroid  disease    TIA (transient ischemic attack) 10/13/2016   Type 2 diabetes mellitus without complication, without long-term current use of insulin  (HCC) 03/09/2012   Last Assessment & Plan:  She brings in readings of her sugars which show she has been doing much better overall with this at home and quite pleased  with her   UTI (urinary tract infection)    Vascular calcification 01/29/2016   Visual changes 01/29/2016   Overview:  Following with Dr. Hugh as well as had recent carotid doppler showing minimal plaque present and vertebral arteries normal , nothing signficant   Vitamin B12 deficiency 12/04/2015  Current Medications: Active Medications[1]    EKGs/Labs/Other Studies Reviewed:    The following studies were reviewed today:  Cardiac Studies & Procedures   ______________________________________________________________________________________________     ECHOCARDIOGRAM  ECHOCARDIOGRAM COMPLETE 01/20/2024  Narrative ECHOCARDIOGRAM REPORT    Patient Name:   Wendy Leon Date of Exam: 01/20/2024 Medical Rec #:  [id number]    Height:       64.0 in Accession #:    [id number]   Weight:       165.4 lb Date of Birth:  03-19-1933     BSA:          1.805 m Patient Age:    89 years     BP:           145/79 mmHg Patient Gender: F            HR:           63 bpm. Exam Location:  [place]  Procedure: 2D Echo, Cardiac Doppler, Color Doppler and Strain Analysis (Both Spectral and Color Flow Doppler were utilized during procedure).  Indications:    Dyspnea R06.00  History:        Patient has prior history of Echocardiogram examinations, most recent 08/25/2022. CHF, CAD, TIA, Arrythmias:LBBB, Signs/Symptoms:Dyspnea; Risk Factors:Hypertension and Dyslipidemia.  Sonographer:    Charlie Jointer RDCS Referring Phys: [phone] JENNIFER C WOODY  IMPRESSIONS   1. Left ventricular ejection fraction, by estimation, is 60 to 65%. The left ventricle has normal function. The left ventricle has no regional wall motion abnormalities. There is mild concentric left ventricular hypertrophy. Left ventricular diastolic parameters are consistent with Grade I diastolic dysfunction (impaired relaxation). The average left ventricular global longitudinal strain is -18.6 %. The global longitudinal strain is  normal. 2. Right ventricular systolic function is normal. The right ventricular size is normal. There is normal pulmonary artery systolic pressure. 3. The mitral valve is degenerative. Mild mitral valve regurgitation. No evidence of mitral stenosis. 4. The aortic valve is tricuspid. Aortic valve regurgitation is not visualized. No aortic stenosis is present. 5. Aortic Normal DTA. 6. The inferior vena cava is normal in size with greater than 50% respiratory variability, suggesting right atrial pressure of 3 mmHg.  FINDINGS Left Ventricle: Left ventricular ejection fraction, by estimation, is 60 to 65%. The left ventricle has normal function. The left ventricle has no regional wall motion abnormalities. The average left ventricular global longitudinal strain is -18.6 %. Strain was performed and the global longitudinal strain is normal. The left ventricular internal cavity size was normal in size. There is mild concentric left ventricular hypertrophy. Left ventricular diastolic parameters are consistent with Grade I diastolic dysfunction (impaired relaxation). Normal left ventricular filling pressure.  Right Ventricle: The right ventricular size is normal. No increase in right ventricular wall thickness. Right ventricular systolic function is normal. There is normal pulmonary artery systolic pressure. The tricuspid regurgitant velocity is 2.84 m/s, and with an assumed right atrial pressure of 3 mmHg, the estimated right ventricular systolic pressure is 35.3 mmHg.  Left Atrium: Left atrial size was normal in size.  Right Atrium: Right atrial size was normal in size.  Pericardium: There is no evidence of pericardial effusion.  Mitral Valve: The mitral valve is degenerative in appearance. Mild mitral annular calcification. Mild mitral valve regurgitation. No evidence of mitral valve stenosis.  Tricuspid Valve: The tricuspid valve is normal in structure. Tricuspid valve regurgitation is mild . No  evidence of tricuspid stenosis.  Aortic Valve: The aortic valve is  tricuspid. Aortic valve regurgitation is not visualized. No aortic stenosis is present.  Pulmonic Valve: The pulmonic valve was normal in structure. Pulmonic valve regurgitation is mild. No evidence of pulmonic stenosis.  Aorta: The aortic arch was not well visualized, Normal DTA and the aortic root and ascending aorta are structurally normal, with no evidence of dilitation.  Venous: The right upper pulmonary vein is normal. The inferior vena cava is normal in size with greater than 50% respiratory variability, suggesting right atrial pressure of 3 mmHg.  IAS/Shunts: No atrial level shunt detected by color flow Doppler.   LEFT VENTRICLE PLAX 2D LVIDd:         3.80 cm   Diastology LVIDs:         2.60 cm   LV e' medial:    6.85 cm/s LV PW:         1.00 cm   LV E/e' medial:  10.3 LV IVS:        1.40 cm   LV e' lateral:   7.07 cm/s LVOT diam:     1.90 cm   LV E/e' lateral: 10.0 LV SV:         74 LV SV Index:   41        2D Longitudinal Strain LVOT Area:     2.84 cm  2D Strain GLS Avg:     -18.6 %   RIGHT VENTRICLE            IVC RV Basal diam:  3.60 cm    IVC diam: 1.90 cm RV Mid diam:    2.30 cm RV S prime:     9.48 cm/s TAPSE (M-mode): 2.2 cm  LEFT ATRIUM             Index        RIGHT ATRIUM           Index LA diam:        3.60 cm 1.99 cm/m   RA Area:     13.70 cm LA Vol (A2C):   40.1 ml 22.22 ml/m  RA Volume:   32.50 ml  18.01 ml/m LA Vol (A4C):   46.0 ml 25.49 ml/m LA Biplane Vol: 42.9 ml 23.77 ml/m AORTIC VALVE LVOT Vmax:   110.00 cm/s LVOT Vmean:  71.700 cm/s LVOT VTI:    0.260 m  AORTA Ao Root diam: 2.90 cm Ao Asc diam:  3.40 cm Ao Desc diam: 2.00 cm  MITRAL VALVE                TRICUSPID VALVE MV Area (PHT): 2.42 cm     TR Peak grad:   32.3 mmHg MV Decel Time: 313 msec     TR Vmax:        284.00 cm/s MV E velocity: 70.77 cm/s MV A velocity: 114.00 cm/s  SHUNTS MV E/A ratio:  0.62          Systemic VTI:  0.26 m Systemic Diam: 1.90 cm  Redell Leiter MD Electronically signed by Redell Leiter MD Signature Date/Time: 01/20/2024/12:38:38 PM    Final    MONITORS  LONG TERM MONITOR (3-14 DAYS) 10/03/2022  Narrative Patch Wear Time:  13 days and 20 hours (2024-05-20T14:43:59-0400 to 2024-06-03T11:16:09-398)  Patient had a min HR of 52 bpm, max HR of 133 bpm, and avg HR of 66 bpm. Predominant underlying rhythm was Sinus Rhythm. Bundle Branch Block/IVCD was present.  There were 9 triggered and 7 diary events all sinus rhythm 1 had  brief atrial fibrillation and several had frequent PVCs and APCs.  There were no pauses of 3 seconds or greater and no episodes of second or third-degree AV nodal block.  Atrial Fibrillation occurred (<1% burden), ranging from 73-133 bpm (avg of 93 bpm), the longest episode lasting 7 mins 19 secs with an avg rate of 89 bpm. Atrial Fibrillation was detected within +/- 45 seconds of symptomatic patient event(s).  Isolated SVEs were rare (<1.0%), SVE Couplets were rare (<1.0%), and SVE Triplets were rare (<1.0%).  Isolated VEs were rare (<1.0%), and no VE Couplets or VE Triplets were present. Ventricular Bigeminy was present.  His monitor was performed because of concerns with bradycardia none is seen.  With the paucity of atrial fibrillation her history of orthostatic hypotension falls and risk-benefit ratio I would not initiate anticoagulation.   CT SCANS  CT CORONARY MORPH W/CTA COR W/SCORE 11/22/2018  Addendum 11/22/2018  4:45 PM ADDENDUM REPORT: 11/22/2018 16:43  CLINICAL DATA:  Chest pain  EXAM: Cardiac CTA  MEDICATIONS: Sub lingual nitro. 4mg  x 2  TECHNIQUE: The patient was scanned on a Siemens 192 slice scanner. Gantry rotation speed was 250 msecs. Collimation was 0.6 mm. A 100 kV prospective scan was triggered in the ascending thoracic aorta at 35-75% of the R-R interval. Average HR during the scan was 60 bpm. The 3D data set was  interpreted on a dedicated work station using MPR, MIP and VRT modes. A total of 80cc of contrast was used.  FINDINGS: Non-cardiac: See separate report from [facility].  Pulmonary veins drain normally to the left atrium.  Calcium Score: 738 Agatston units.  Coronary Arteries: Right dominant with no anomalies  LM: Calcified plaque distal left main, no significant stenosis.  LAD system: Calcified plaque proximal and mid LAD, suspect no more than mild (<50%) stenosis.  Circumflex system: Calcified plaque in the proximal LCx, mild (<50%) stenosis.  RCA system: Mixed plaque in the proximal RCA with mild (<50%) stenosis. Calcified plaque mid RCA, suspect no more than 50% stenosis but difficult given blooming artifact.  IMPRESSION: 1. Coronary artery calcium score 738 Agatston units. This places the patient in the 82nd percentile for age and gender.  2.  Suspect nonobstructive CAD but will send for FFR to confirm.  Dalton Mclean   Electronically Signed By: Ezra Shuck M.D. On: 11/22/2018 16:43  Narrative EXAM: OVER-READ INTERPRETATION  CT CHEST  The following report is an over-read performed by radiologist Dr. Camellia Candle of [facility] on 11/22/2018. This over-read does not include interpretation of cardiac or coronary anatomy or pathology. The coronary CTA interpretation by the cardiologist is attached.  COMPARISON:  05/18/2017  FINDINGS: Vascular: There is abdominal aortic atherosclerosis without aneurysm.  Mediastinum/Nodes: No lymphadenopathy within the visualized mediastinum. No lymphadenopathy within the visualized hilar regions.  Lungs/Pleura: 6 mm peripheral right lower lobe nodule (19/12) is stable in the interval, compatible with benign etiology. Small subpleural nodules in the right upper lobe (images 2, 3/12) are unchanged. Nodularity posterior to the major fissure, left lower lobe on 01/12 is stable. No evidence for pleural  effusion.  Upper Abdomen: Unremarkable.  Musculoskeletal: No worrisome lytic or sclerotic osseous abnormality.  IMPRESSION: 1. Small bilateral pulmonary nodules stable since 05/18/2017 consistent with benign etiology. 2.  Aortic Atherosclerois (ICD10-170.0)  Electronically Signed: By: Camellia Candle M.D. On: 11/22/2018 16:26   CT CORONARY FRACTIONAL FLOW RESERVE DATA PREP 11/22/2018  Narrative CLINICAL DATA:  Chest pain  EXAM: CT FFR  MEDICATIONS: No additional  medications.  TECHNIQUE: The coronary CTA was sent for FFR.  FINDINGS: FFR 0.66 mid to distal LAD  FFR 0.89 mid RCA  FFR 0.87 mid LCx  IMPRESSION: This suggests the presence of a hemodynamically significant proximal LAD stenosis. Visually, there was calcified plaque throughout the proximal and mid LAD that did not appear obstructive. Query low FFR as cumulative effect of extensive mild-moderate plaque but if symptomatic would consider catheterization for more close assessment.  Dalton Mclean   Electronically Signed By: Ezra Shuck M.D. On: 11/23/2018 07:59     ______________________________________________________________________________________________          Recent Labs: 05/17/2024: ALT 24; BUN 33; Creatinine 1.43; Hemoglobin 11.1; Platelet Count 418; Potassium 3.5; Sodium 137; TSH 0.988  Recent Lipid Panel    Component Value Date/Time   CHOL 190 08/24/2022 0231   CHOL 178 07/13/2020 0946   TRIG 87 08/24/2022 0231   HDL 66 08/24/2022 0231   HDL 66 07/13/2020 0946   CHOLHDL 2.9 08/24/2022 0231   VLDL 17 08/24/2022 0231   LDLCALC 107 (H) 08/24/2022 0231   LDLCALC 89 07/13/2020 0946    Physical Exam:    VS:  There were no vitals taken for this visit.    Wt Readings from Last 3 Encounters:  12/31/23 165 lb 6.4 oz (75 kg)  12/22/23 166 lb (75.3 kg)  06/02/23 158 lb 6.4 oz (71.8 kg)     GEN: *** Well nourished, well developed in no acute distress HEENT: Normal NECK: No JVD;  No carotid bruits LYMPHATICS: No lymphadenopathy CARDIAC: ***RRR, no murmurs, rubs, gallops RESPIRATORY:  Clear to auscultation without rales, wheezing or rhonchi  ABDOMEN: Soft, non-tender, non-distended MUSCULOSKELETAL:  No edema; No deformity  SKIN: Warm and dry NEUROLOGIC:  Alert and oriented x 3 PSYCHIATRIC:  Normal affect    Signed, Redell Leiter, MD  05/29/2024 5:00 PM    [place] [facility]     [1]  No outpatient medications have been marked as taking for the 05/30/24 encounter (Appointment) with Leiter Redell PARAS, MD.   "

## 2024-05-30 ENCOUNTER — Ambulatory Visit: Payer: Self-pay | Admitting: Cardiology

## 2024-05-30 DIAGNOSIS — I480 Paroxysmal atrial fibrillation: Secondary | ICD-10-CM

## 2024-05-30 DIAGNOSIS — E782 Mixed hyperlipidemia: Secondary | ICD-10-CM

## 2024-05-30 DIAGNOSIS — Z789 Other specified health status: Secondary | ICD-10-CM

## 2024-05-30 DIAGNOSIS — I25118 Atherosclerotic heart disease of native coronary artery with other forms of angina pectoris: Secondary | ICD-10-CM

## 2024-05-30 DIAGNOSIS — I447 Left bundle-branch block, unspecified: Secondary | ICD-10-CM

## 2024-05-30 DIAGNOSIS — E1122 Type 2 diabetes mellitus with diabetic chronic kidney disease: Secondary | ICD-10-CM

## 2024-05-30 DIAGNOSIS — I110 Hypertensive heart disease with heart failure: Secondary | ICD-10-CM

## 2024-06-08 ENCOUNTER — Ambulatory Visit: Admitting: Cardiology

## 2024-08-15 ENCOUNTER — Inpatient Hospital Stay

## 2024-08-15 ENCOUNTER — Inpatient Hospital Stay: Admitting: Hematology and Oncology
